# Patient Record
Sex: Female | Born: 1940 | Race: White | Hispanic: No | Smoking: Never smoker
Health system: Southern US, Community
[De-identification: ages and names within clinical notes are randomized; demographics above are authoritative.]

## PROBLEM LIST (undated history)

## (undated) DIAGNOSIS — I5032 Chronic diastolic (congestive) heart failure: Secondary | ICD-10-CM

## (undated) DIAGNOSIS — E785 Hyperlipidemia, unspecified: Secondary | ICD-10-CM

## (undated) DIAGNOSIS — E669 Obesity, unspecified: Secondary | ICD-10-CM

## (undated) DIAGNOSIS — R5381 Other malaise: Secondary | ICD-10-CM

## (undated) DIAGNOSIS — E559 Vitamin D deficiency, unspecified: Secondary | ICD-10-CM

## (undated) DIAGNOSIS — I10 Essential (primary) hypertension: Secondary | ICD-10-CM

## (undated) DIAGNOSIS — R635 Abnormal weight gain: Secondary | ICD-10-CM

## (undated) DIAGNOSIS — R079 Chest pain, unspecified: Secondary | ICD-10-CM

## (undated) DIAGNOSIS — R739 Hyperglycemia, unspecified: Secondary | ICD-10-CM

## (undated) DIAGNOSIS — G4734 Idiopathic sleep related nonobstructive alveolar hypoventilation: Secondary | ICD-10-CM

## (undated) DIAGNOSIS — R0609 Other forms of dyspnea: Secondary | ICD-10-CM

## (undated) DIAGNOSIS — Z6837 Body mass index (BMI) 37.0-37.9, adult: Secondary | ICD-10-CM

## (undated) DIAGNOSIS — H6500 Acute serous otitis media, unspecified ear: Secondary | ICD-10-CM

## (undated) DIAGNOSIS — F419 Anxiety disorder, unspecified: Secondary | ICD-10-CM

## (undated) DIAGNOSIS — G4700 Insomnia, unspecified: Secondary | ICD-10-CM

## (undated) DIAGNOSIS — R0600 Dyspnea, unspecified: Secondary | ICD-10-CM

## (undated) DIAGNOSIS — R140 Abdominal distension (gaseous): Secondary | ICD-10-CM

## (undated) DIAGNOSIS — G500 Trigeminal neuralgia: Secondary | ICD-10-CM

## (undated) DIAGNOSIS — R531 Weakness: Secondary | ICD-10-CM

## (undated) DIAGNOSIS — Z6841 Body Mass Index (BMI) 40.0 and over, adult: Secondary | ICD-10-CM

## (undated) DIAGNOSIS — Z9181 History of falling: Secondary | ICD-10-CM

## (undated) DIAGNOSIS — R0902 Hypoxemia: Secondary | ICD-10-CM

## (undated) DIAGNOSIS — J209 Acute bronchitis, unspecified: Secondary | ICD-10-CM

## (undated) DIAGNOSIS — E039 Hypothyroidism, unspecified: Secondary | ICD-10-CM

## (undated) DIAGNOSIS — S8012XA Contusion of left lower leg, initial encounter: Secondary | ICD-10-CM

## (undated) DIAGNOSIS — R931 Abnormal findings on diagnostic imaging of heart and coronary circulation: Secondary | ICD-10-CM

## (undated) DIAGNOSIS — F329 Major depressive disorder, single episode, unspecified: Secondary | ICD-10-CM

## (undated) DIAGNOSIS — R600 Localized edema: Secondary | ICD-10-CM

## (undated) DIAGNOSIS — R5383 Other fatigue: Secondary | ICD-10-CM

## (undated) HISTORY — DX: Hypoxemia: R09.02

## (undated) HISTORY — DX: Insomnia, unspecified: G47.00

## (undated) HISTORY — DX: Other fatigue: R53.83

## (undated) HISTORY — DX: Acute bronchitis, unspecified: J20.9

## (undated) HISTORY — DX: Vitamin D deficiency, unspecified: E55.9

## (undated) HISTORY — DX: Hyperlipidemia, unspecified: E78.5

## (undated) HISTORY — DX: Abnormal weight gain: R63.5

## (undated) HISTORY — DX: Anxiety disorder, unspecified: F41.9

## (undated) HISTORY — DX: Weakness: R53.1

## (undated) HISTORY — PX: CARDIAC CATHETERIZATION: SHX172

## (undated) HISTORY — DX: Other malaise: R53.81

## (undated) HISTORY — PX: TUBAL LIGATION: SHX77

## (undated) HISTORY — DX: Hypothyroidism, unspecified: E03.9

## (undated) HISTORY — DX: Major depressive disorder, single episode, unspecified: F32.9

## (undated) HISTORY — DX: Acute serous otitis media, unspecified ear: H65.00

## (undated) HISTORY — DX: Hyperglycemia, unspecified: R73.9

## (undated) HISTORY — PX: DILATION AND CURETTAGE OF UTERUS: SHX78

## (undated) HISTORY — DX: Essential (primary) hypertension: I10

## (undated) HISTORY — DX: History of falling: Z91.81

## (undated) HISTORY — DX: Trigeminal neuralgia: G50.0

## (undated) HISTORY — DX: Chest pain, unspecified: R07.9

## (undated) HISTORY — DX: Idiopathic sleep related nonobstructive alveolar hypoventilation: G47.34

## (undated) HISTORY — DX: Body mass index (BMI) 37.0-37.9, adult: Z68.37

## (undated) HISTORY — DX: Abdominal distension (gaseous): R14.0

## (undated) HISTORY — DX: Abnormal findings on diagnostic imaging of heart and coronary circulation: R93.1

## (undated) HISTORY — DX: Body Mass Index (BMI) 40.0 and over, adult: Z684

## (undated) HISTORY — DX: Contusion of left lower leg, initial encounter: S80.12XA

## (undated) HISTORY — DX: Other forms of dyspnea: R06.09

## (undated) HISTORY — DX: Chronic diastolic (congestive) heart failure: I50.32

## (undated) HISTORY — DX: Localized edema: R60.0

## (undated) HISTORY — DX: Obesity, unspecified: E66.9

## (undated) HISTORY — DX: Dyspnea, unspecified: R06.00

---

## 1998-08-07 ENCOUNTER — Other Ambulatory Visit: Admission: RE | Admit: 1998-08-07 | Discharge: 1998-08-07 | Payer: Self-pay | Admitting: Obstetrics & Gynecology

## 1999-08-29 ENCOUNTER — Other Ambulatory Visit: Admission: RE | Admit: 1999-08-29 | Discharge: 1999-08-29 | Payer: Self-pay | Admitting: Obstetrics & Gynecology

## 2000-09-29 ENCOUNTER — Other Ambulatory Visit: Admission: RE | Admit: 2000-09-29 | Discharge: 2000-09-29 | Payer: Self-pay | Admitting: Obstetrics & Gynecology

## 2002-01-18 ENCOUNTER — Other Ambulatory Visit: Admission: RE | Admit: 2002-01-18 | Discharge: 2002-01-18 | Payer: Self-pay | Admitting: Obstetrics & Gynecology

## 2003-03-31 ENCOUNTER — Other Ambulatory Visit: Admission: RE | Admit: 2003-03-31 | Discharge: 2003-03-31 | Payer: Self-pay | Admitting: Obstetrics & Gynecology

## 2004-06-04 ENCOUNTER — Other Ambulatory Visit: Admission: RE | Admit: 2004-06-04 | Discharge: 2004-06-04 | Payer: Self-pay | Admitting: Obstetrics & Gynecology

## 2005-09-08 ENCOUNTER — Other Ambulatory Visit: Admission: RE | Admit: 2005-09-08 | Discharge: 2005-09-08 | Payer: Self-pay | Admitting: Obstetrics & Gynecology

## 2011-10-17 DIAGNOSIS — R141 Gas pain: Secondary | ICD-10-CM | POA: Diagnosis not present

## 2011-10-17 DIAGNOSIS — R143 Flatulence: Secondary | ICD-10-CM | POA: Diagnosis not present

## 2011-10-17 DIAGNOSIS — I10 Essential (primary) hypertension: Secondary | ICD-10-CM | POA: Diagnosis not present

## 2011-10-17 DIAGNOSIS — Z6836 Body mass index (BMI) 36.0-36.9, adult: Secondary | ICD-10-CM | POA: Diagnosis not present

## 2011-12-12 DIAGNOSIS — E669 Obesity, unspecified: Secondary | ICD-10-CM | POA: Diagnosis not present

## 2011-12-12 DIAGNOSIS — E785 Hyperlipidemia, unspecified: Secondary | ICD-10-CM | POA: Diagnosis not present

## 2011-12-12 DIAGNOSIS — I10 Essential (primary) hypertension: Secondary | ICD-10-CM | POA: Diagnosis not present

## 2011-12-12 DIAGNOSIS — Z6836 Body mass index (BMI) 36.0-36.9, adult: Secondary | ICD-10-CM | POA: Diagnosis not present

## 2012-02-17 DIAGNOSIS — Z6836 Body mass index (BMI) 36.0-36.9, adult: Secondary | ICD-10-CM | POA: Diagnosis not present

## 2012-02-17 DIAGNOSIS — R609 Edema, unspecified: Secondary | ICD-10-CM | POA: Diagnosis not present

## 2012-03-24 DIAGNOSIS — Z6836 Body mass index (BMI) 36.0-36.9, adult: Secondary | ICD-10-CM | POA: Diagnosis not present

## 2012-03-24 DIAGNOSIS — I10 Essential (primary) hypertension: Secondary | ICD-10-CM | POA: Diagnosis not present

## 2012-03-24 DIAGNOSIS — R609 Edema, unspecified: Secondary | ICD-10-CM | POA: Diagnosis not present

## 2012-03-24 DIAGNOSIS — E785 Hyperlipidemia, unspecified: Secondary | ICD-10-CM | POA: Diagnosis not present

## 2012-06-25 DIAGNOSIS — R609 Edema, unspecified: Secondary | ICD-10-CM | POA: Diagnosis not present

## 2012-06-25 DIAGNOSIS — Z6836 Body mass index (BMI) 36.0-36.9, adult: Secondary | ICD-10-CM | POA: Diagnosis not present

## 2012-06-25 DIAGNOSIS — I10 Essential (primary) hypertension: Secondary | ICD-10-CM | POA: Diagnosis not present

## 2012-06-25 DIAGNOSIS — E785 Hyperlipidemia, unspecified: Secondary | ICD-10-CM | POA: Diagnosis not present

## 2012-08-06 DIAGNOSIS — Z1231 Encounter for screening mammogram for malignant neoplasm of breast: Secondary | ICD-10-CM | POA: Diagnosis not present

## 2012-11-23 DIAGNOSIS — I10 Essential (primary) hypertension: Secondary | ICD-10-CM | POA: Diagnosis not present

## 2012-11-23 DIAGNOSIS — R609 Edema, unspecified: Secondary | ICD-10-CM | POA: Diagnosis not present

## 2012-11-23 DIAGNOSIS — E785 Hyperlipidemia, unspecified: Secondary | ICD-10-CM | POA: Diagnosis not present

## 2012-11-23 DIAGNOSIS — Z6837 Body mass index (BMI) 37.0-37.9, adult: Secondary | ICD-10-CM | POA: Diagnosis not present

## 2013-03-29 DIAGNOSIS — Z6836 Body mass index (BMI) 36.0-36.9, adult: Secondary | ICD-10-CM | POA: Diagnosis not present

## 2013-03-29 DIAGNOSIS — I10 Essential (primary) hypertension: Secondary | ICD-10-CM | POA: Diagnosis not present

## 2013-03-29 DIAGNOSIS — Z1331 Encounter for screening for depression: Secondary | ICD-10-CM | POA: Diagnosis not present

## 2013-03-29 DIAGNOSIS — E785 Hyperlipidemia, unspecified: Secondary | ICD-10-CM | POA: Diagnosis not present

## 2013-03-29 DIAGNOSIS — R609 Edema, unspecified: Secondary | ICD-10-CM | POA: Diagnosis not present

## 2013-03-29 DIAGNOSIS — Z9181 History of falling: Secondary | ICD-10-CM | POA: Diagnosis not present

## 2013-11-30 DIAGNOSIS — R609 Edema, unspecified: Secondary | ICD-10-CM | POA: Diagnosis not present

## 2013-11-30 DIAGNOSIS — E785 Hyperlipidemia, unspecified: Secondary | ICD-10-CM | POA: Diagnosis not present

## 2013-11-30 DIAGNOSIS — Z6837 Body mass index (BMI) 37.0-37.9, adult: Secondary | ICD-10-CM | POA: Diagnosis not present

## 2013-11-30 DIAGNOSIS — I10 Essential (primary) hypertension: Secondary | ICD-10-CM | POA: Diagnosis not present

## 2014-02-02 DIAGNOSIS — Z6837 Body mass index (BMI) 37.0-37.9, adult: Secondary | ICD-10-CM | POA: Diagnosis not present

## 2014-02-02 DIAGNOSIS — J069 Acute upper respiratory infection, unspecified: Secondary | ICD-10-CM | POA: Diagnosis not present

## 2014-02-02 DIAGNOSIS — J309 Allergic rhinitis, unspecified: Secondary | ICD-10-CM | POA: Diagnosis not present

## 2014-02-27 DIAGNOSIS — J209 Acute bronchitis, unspecified: Secondary | ICD-10-CM | POA: Diagnosis not present

## 2014-02-27 DIAGNOSIS — Z6837 Body mass index (BMI) 37.0-37.9, adult: Secondary | ICD-10-CM | POA: Diagnosis not present

## 2014-03-10 DIAGNOSIS — I10 Essential (primary) hypertension: Secondary | ICD-10-CM | POA: Diagnosis not present

## 2014-03-10 DIAGNOSIS — E785 Hyperlipidemia, unspecified: Secondary | ICD-10-CM | POA: Diagnosis not present

## 2014-03-10 DIAGNOSIS — Z6841 Body Mass Index (BMI) 40.0 and over, adult: Secondary | ICD-10-CM | POA: Diagnosis not present

## 2014-03-10 DIAGNOSIS — R609 Edema, unspecified: Secondary | ICD-10-CM | POA: Diagnosis not present

## 2014-08-01 DIAGNOSIS — I10 Essential (primary) hypertension: Secondary | ICD-10-CM | POA: Diagnosis not present

## 2014-08-01 DIAGNOSIS — R609 Edema, unspecified: Secondary | ICD-10-CM | POA: Diagnosis not present

## 2014-08-01 DIAGNOSIS — F419 Anxiety disorder, unspecified: Secondary | ICD-10-CM | POA: Diagnosis not present

## 2014-08-01 DIAGNOSIS — G4700 Insomnia, unspecified: Secondary | ICD-10-CM | POA: Diagnosis not present

## 2014-08-01 DIAGNOSIS — Z9181 History of falling: Secondary | ICD-10-CM | POA: Diagnosis not present

## 2014-08-01 DIAGNOSIS — E785 Hyperlipidemia, unspecified: Secondary | ICD-10-CM | POA: Diagnosis not present

## 2014-08-01 DIAGNOSIS — Z1389 Encounter for screening for other disorder: Secondary | ICD-10-CM | POA: Diagnosis not present

## 2014-08-01 DIAGNOSIS — Z6837 Body mass index (BMI) 37.0-37.9, adult: Secondary | ICD-10-CM | POA: Diagnosis not present

## 2015-02-15 DIAGNOSIS — R609 Edema, unspecified: Secondary | ICD-10-CM | POA: Diagnosis not present

## 2015-02-15 DIAGNOSIS — E785 Hyperlipidemia, unspecified: Secondary | ICD-10-CM | POA: Diagnosis not present

## 2015-02-15 DIAGNOSIS — Z6838 Body mass index (BMI) 38.0-38.9, adult: Secondary | ICD-10-CM | POA: Diagnosis not present

## 2015-02-15 DIAGNOSIS — I10 Essential (primary) hypertension: Secondary | ICD-10-CM | POA: Diagnosis not present

## 2015-02-15 DIAGNOSIS — R635 Abnormal weight gain: Secondary | ICD-10-CM | POA: Diagnosis not present

## 2015-02-15 DIAGNOSIS — Z9181 History of falling: Secondary | ICD-10-CM | POA: Diagnosis not present

## 2015-02-15 DIAGNOSIS — F329 Major depressive disorder, single episode, unspecified: Secondary | ICD-10-CM | POA: Diagnosis not present

## 2015-02-15 DIAGNOSIS — G500 Trigeminal neuralgia: Secondary | ICD-10-CM | POA: Diagnosis not present

## 2015-03-08 DIAGNOSIS — Z6838 Body mass index (BMI) 38.0-38.9, adult: Secondary | ICD-10-CM | POA: Diagnosis not present

## 2015-03-08 DIAGNOSIS — R21 Rash and other nonspecific skin eruption: Secondary | ICD-10-CM | POA: Diagnosis not present

## 2015-06-14 DIAGNOSIS — E785 Hyperlipidemia, unspecified: Secondary | ICD-10-CM | POA: Diagnosis not present

## 2015-06-14 DIAGNOSIS — I10 Essential (primary) hypertension: Secondary | ICD-10-CM | POA: Diagnosis not present

## 2015-06-14 DIAGNOSIS — Z6838 Body mass index (BMI) 38.0-38.9, adult: Secondary | ICD-10-CM | POA: Diagnosis not present

## 2015-06-14 DIAGNOSIS — F419 Anxiety disorder, unspecified: Secondary | ICD-10-CM | POA: Diagnosis not present

## 2015-06-14 DIAGNOSIS — R635 Abnormal weight gain: Secondary | ICD-10-CM | POA: Diagnosis not present

## 2015-06-14 DIAGNOSIS — R600 Localized edema: Secondary | ICD-10-CM | POA: Diagnosis not present

## 2015-07-17 DIAGNOSIS — I10 Essential (primary) hypertension: Secondary | ICD-10-CM | POA: Diagnosis not present

## 2015-07-17 DIAGNOSIS — Z6837 Body mass index (BMI) 37.0-37.9, adult: Secondary | ICD-10-CM | POA: Diagnosis not present

## 2015-08-21 DIAGNOSIS — I10 Essential (primary) hypertension: Secondary | ICD-10-CM | POA: Diagnosis not present

## 2015-08-21 DIAGNOSIS — E669 Obesity, unspecified: Secondary | ICD-10-CM | POA: Diagnosis not present

## 2015-08-21 DIAGNOSIS — R079 Chest pain, unspecified: Secondary | ICD-10-CM | POA: Diagnosis not present

## 2015-11-08 DIAGNOSIS — R079 Chest pain, unspecified: Secondary | ICD-10-CM | POA: Diagnosis not present

## 2015-11-08 DIAGNOSIS — Z1389 Encounter for screening for other disorder: Secondary | ICD-10-CM | POA: Diagnosis not present

## 2015-11-08 DIAGNOSIS — R600 Localized edema: Secondary | ICD-10-CM | POA: Diagnosis not present

## 2015-11-08 DIAGNOSIS — Z6838 Body mass index (BMI) 38.0-38.9, adult: Secondary | ICD-10-CM | POA: Diagnosis not present

## 2015-11-08 DIAGNOSIS — I10 Essential (primary) hypertension: Secondary | ICD-10-CM | POA: Diagnosis not present

## 2015-11-08 DIAGNOSIS — Z139 Encounter for screening, unspecified: Secondary | ICD-10-CM | POA: Diagnosis not present

## 2015-11-08 DIAGNOSIS — E785 Hyperlipidemia, unspecified: Secondary | ICD-10-CM | POA: Diagnosis not present

## 2015-11-08 DIAGNOSIS — E669 Obesity, unspecified: Secondary | ICD-10-CM | POA: Diagnosis not present

## 2015-11-08 DIAGNOSIS — R635 Abnormal weight gain: Secondary | ICD-10-CM | POA: Diagnosis not present

## 2015-12-13 DIAGNOSIS — R6889 Other general symptoms and signs: Secondary | ICD-10-CM | POA: Diagnosis not present

## 2015-12-13 DIAGNOSIS — Z6838 Body mass index (BMI) 38.0-38.9, adult: Secondary | ICD-10-CM | POA: Diagnosis not present

## 2015-12-13 DIAGNOSIS — J101 Influenza due to other identified influenza virus with other respiratory manifestations: Secondary | ICD-10-CM | POA: Diagnosis not present

## 2016-02-14 DIAGNOSIS — E039 Hypothyroidism, unspecified: Secondary | ICD-10-CM | POA: Diagnosis not present

## 2016-02-14 DIAGNOSIS — Z6838 Body mass index (BMI) 38.0-38.9, adult: Secondary | ICD-10-CM | POA: Diagnosis not present

## 2016-02-14 DIAGNOSIS — E785 Hyperlipidemia, unspecified: Secondary | ICD-10-CM | POA: Diagnosis not present

## 2016-02-14 DIAGNOSIS — R600 Localized edema: Secondary | ICD-10-CM | POA: Diagnosis not present

## 2016-02-14 DIAGNOSIS — E669 Obesity, unspecified: Secondary | ICD-10-CM | POA: Diagnosis not present

## 2016-02-14 DIAGNOSIS — I10 Essential (primary) hypertension: Secondary | ICD-10-CM | POA: Diagnosis not present

## 2016-03-18 DIAGNOSIS — Z6838 Body mass index (BMI) 38.0-38.9, adult: Secondary | ICD-10-CM | POA: Diagnosis not present

## 2016-03-18 DIAGNOSIS — M8589 Other specified disorders of bone density and structure, multiple sites: Secondary | ICD-10-CM | POA: Diagnosis not present

## 2016-03-18 DIAGNOSIS — Z1231 Encounter for screening mammogram for malignant neoplasm of breast: Secondary | ICD-10-CM | POA: Diagnosis not present

## 2016-03-18 DIAGNOSIS — Z9181 History of falling: Secondary | ICD-10-CM | POA: Diagnosis not present

## 2016-03-26 DIAGNOSIS — M8589 Other specified disorders of bone density and structure, multiple sites: Secondary | ICD-10-CM | POA: Diagnosis not present

## 2016-03-26 DIAGNOSIS — Z1231 Encounter for screening mammogram for malignant neoplasm of breast: Secondary | ICD-10-CM | POA: Diagnosis not present

## 2016-03-28 DIAGNOSIS — Z1231 Encounter for screening mammogram for malignant neoplasm of breast: Secondary | ICD-10-CM | POA: Diagnosis not present

## 2016-03-28 DIAGNOSIS — M8589 Other specified disorders of bone density and structure, multiple sites: Secondary | ICD-10-CM | POA: Diagnosis not present

## 2016-07-03 DIAGNOSIS — E785 Hyperlipidemia, unspecified: Secondary | ICD-10-CM | POA: Diagnosis not present

## 2016-07-03 DIAGNOSIS — R5381 Other malaise: Secondary | ICD-10-CM | POA: Diagnosis not present

## 2016-07-03 DIAGNOSIS — I10 Essential (primary) hypertension: Secondary | ICD-10-CM | POA: Diagnosis not present

## 2016-07-03 DIAGNOSIS — E039 Hypothyroidism, unspecified: Secondary | ICD-10-CM | POA: Diagnosis not present

## 2016-07-03 DIAGNOSIS — R739 Hyperglycemia, unspecified: Secondary | ICD-10-CM | POA: Diagnosis not present

## 2016-07-03 DIAGNOSIS — Z0000 Encounter for general adult medical examination without abnormal findings: Secondary | ICD-10-CM | POA: Diagnosis not present

## 2016-07-03 DIAGNOSIS — Z1211 Encounter for screening for malignant neoplasm of colon: Secondary | ICD-10-CM | POA: Diagnosis not present

## 2016-10-10 DIAGNOSIS — I10 Essential (primary) hypertension: Secondary | ICD-10-CM | POA: Diagnosis not present

## 2016-10-10 DIAGNOSIS — E785 Hyperlipidemia, unspecified: Secondary | ICD-10-CM | POA: Diagnosis not present

## 2016-10-10 DIAGNOSIS — E039 Hypothyroidism, unspecified: Secondary | ICD-10-CM | POA: Diagnosis not present

## 2016-10-10 DIAGNOSIS — Z6841 Body Mass Index (BMI) 40.0 and over, adult: Secondary | ICD-10-CM | POA: Diagnosis not present

## 2016-10-10 DIAGNOSIS — E669 Obesity, unspecified: Secondary | ICD-10-CM | POA: Diagnosis not present

## 2016-10-10 DIAGNOSIS — R739 Hyperglycemia, unspecified: Secondary | ICD-10-CM | POA: Diagnosis not present

## 2016-10-10 DIAGNOSIS — Z2821 Immunization not carried out because of patient refusal: Secondary | ICD-10-CM | POA: Diagnosis not present

## 2017-01-23 DIAGNOSIS — E785 Hyperlipidemia, unspecified: Secondary | ICD-10-CM | POA: Diagnosis not present

## 2017-01-23 DIAGNOSIS — Z1231 Encounter for screening mammogram for malignant neoplasm of breast: Secondary | ICD-10-CM | POA: Diagnosis not present

## 2017-01-23 DIAGNOSIS — Z1389 Encounter for screening for other disorder: Secondary | ICD-10-CM | POA: Diagnosis not present

## 2017-01-23 DIAGNOSIS — E669 Obesity, unspecified: Secondary | ICD-10-CM | POA: Diagnosis not present

## 2017-01-23 DIAGNOSIS — I10 Essential (primary) hypertension: Secondary | ICD-10-CM | POA: Diagnosis not present

## 2017-01-23 DIAGNOSIS — R739 Hyperglycemia, unspecified: Secondary | ICD-10-CM | POA: Diagnosis not present

## 2017-03-30 DIAGNOSIS — Z1231 Encounter for screening mammogram for malignant neoplasm of breast: Secondary | ICD-10-CM | POA: Diagnosis not present

## 2017-05-28 DIAGNOSIS — Z9181 History of falling: Secondary | ICD-10-CM | POA: Diagnosis not present

## 2017-05-28 DIAGNOSIS — R739 Hyperglycemia, unspecified: Secondary | ICD-10-CM | POA: Diagnosis not present

## 2017-05-28 DIAGNOSIS — I10 Essential (primary) hypertension: Secondary | ICD-10-CM | POA: Diagnosis not present

## 2017-05-28 DIAGNOSIS — E039 Hypothyroidism, unspecified: Secondary | ICD-10-CM | POA: Diagnosis not present

## 2017-05-28 DIAGNOSIS — Z6841 Body Mass Index (BMI) 40.0 and over, adult: Secondary | ICD-10-CM | POA: Diagnosis not present

## 2017-05-28 DIAGNOSIS — Z139 Encounter for screening, unspecified: Secondary | ICD-10-CM | POA: Diagnosis not present

## 2017-05-28 DIAGNOSIS — E669 Obesity, unspecified: Secondary | ICD-10-CM | POA: Diagnosis not present

## 2017-05-28 DIAGNOSIS — E785 Hyperlipidemia, unspecified: Secondary | ICD-10-CM | POA: Diagnosis not present

## 2017-09-02 DIAGNOSIS — R0609 Other forms of dyspnea: Secondary | ICD-10-CM | POA: Diagnosis not present

## 2017-09-02 DIAGNOSIS — Z2821 Immunization not carried out because of patient refusal: Secondary | ICD-10-CM | POA: Diagnosis not present

## 2017-09-02 DIAGNOSIS — I10 Essential (primary) hypertension: Secondary | ICD-10-CM | POA: Diagnosis not present

## 2017-09-02 DIAGNOSIS — R739 Hyperglycemia, unspecified: Secondary | ICD-10-CM | POA: Diagnosis not present

## 2017-09-02 DIAGNOSIS — Z139 Encounter for screening, unspecified: Secondary | ICD-10-CM | POA: Diagnosis not present

## 2017-09-02 DIAGNOSIS — E039 Hypothyroidism, unspecified: Secondary | ICD-10-CM | POA: Diagnosis not present

## 2017-09-02 DIAGNOSIS — E785 Hyperlipidemia, unspecified: Secondary | ICD-10-CM | POA: Diagnosis not present

## 2017-12-15 DIAGNOSIS — R140 Abdominal distension (gaseous): Secondary | ICD-10-CM | POA: Diagnosis not present

## 2017-12-15 DIAGNOSIS — E785 Hyperlipidemia, unspecified: Secondary | ICD-10-CM | POA: Diagnosis not present

## 2017-12-15 DIAGNOSIS — I10 Essential (primary) hypertension: Secondary | ICD-10-CM | POA: Diagnosis not present

## 2017-12-15 DIAGNOSIS — Z6841 Body Mass Index (BMI) 40.0 and over, adult: Secondary | ICD-10-CM | POA: Diagnosis not present

## 2017-12-15 DIAGNOSIS — E039 Hypothyroidism, unspecified: Secondary | ICD-10-CM | POA: Diagnosis not present

## 2017-12-15 DIAGNOSIS — R0609 Other forms of dyspnea: Secondary | ICD-10-CM | POA: Diagnosis not present

## 2017-12-15 DIAGNOSIS — R739 Hyperglycemia, unspecified: Secondary | ICD-10-CM | POA: Diagnosis not present

## 2017-12-15 DIAGNOSIS — Z2821 Immunization not carried out because of patient refusal: Secondary | ICD-10-CM | POA: Diagnosis not present

## 2017-12-29 DIAGNOSIS — Z2821 Immunization not carried out because of patient refusal: Secondary | ICD-10-CM | POA: Diagnosis not present

## 2017-12-29 DIAGNOSIS — Z6841 Body Mass Index (BMI) 40.0 and over, adult: Secondary | ICD-10-CM | POA: Diagnosis not present

## 2017-12-29 DIAGNOSIS — I10 Essential (primary) hypertension: Secondary | ICD-10-CM | POA: Diagnosis not present

## 2018-01-01 DIAGNOSIS — R0600 Dyspnea, unspecified: Secondary | ICD-10-CM | POA: Diagnosis not present

## 2018-01-01 DIAGNOSIS — R109 Unspecified abdominal pain: Secondary | ICD-10-CM | POA: Diagnosis not present

## 2018-01-01 DIAGNOSIS — R140 Abdominal distension (gaseous): Secondary | ICD-10-CM | POA: Diagnosis not present

## 2018-01-01 DIAGNOSIS — R0609 Other forms of dyspnea: Secondary | ICD-10-CM | POA: Diagnosis not present

## 2018-01-01 DIAGNOSIS — R0602 Shortness of breath: Secondary | ICD-10-CM | POA: Diagnosis not present

## 2018-01-08 DIAGNOSIS — Z1231 Encounter for screening mammogram for malignant neoplasm of breast: Secondary | ICD-10-CM | POA: Diagnosis not present

## 2018-01-08 DIAGNOSIS — E785 Hyperlipidemia, unspecified: Secondary | ICD-10-CM | POA: Diagnosis not present

## 2018-01-08 DIAGNOSIS — Z139 Encounter for screening, unspecified: Secondary | ICD-10-CM | POA: Diagnosis not present

## 2018-01-08 DIAGNOSIS — Z0000 Encounter for general adult medical examination without abnormal findings: Secondary | ICD-10-CM | POA: Diagnosis not present

## 2018-01-08 DIAGNOSIS — Z136 Encounter for screening for cardiovascular disorders: Secondary | ICD-10-CM | POA: Diagnosis not present

## 2018-01-08 DIAGNOSIS — E669 Obesity, unspecified: Secondary | ICD-10-CM | POA: Diagnosis not present

## 2018-01-08 DIAGNOSIS — N959 Unspecified menopausal and perimenopausal disorder: Secondary | ICD-10-CM | POA: Diagnosis not present

## 2018-01-08 DIAGNOSIS — Z1331 Encounter for screening for depression: Secondary | ICD-10-CM | POA: Diagnosis not present

## 2018-01-08 DIAGNOSIS — Z9181 History of falling: Secondary | ICD-10-CM | POA: Diagnosis not present

## 2018-01-28 DIAGNOSIS — I10 Essential (primary) hypertension: Secondary | ICD-10-CM | POA: Diagnosis not present

## 2018-01-28 DIAGNOSIS — Z23 Encounter for immunization: Secondary | ICD-10-CM | POA: Diagnosis not present

## 2018-02-03 DIAGNOSIS — R5383 Other fatigue: Secondary | ICD-10-CM

## 2018-02-03 DIAGNOSIS — I10 Essential (primary) hypertension: Secondary | ICD-10-CM

## 2018-02-03 DIAGNOSIS — F32A Depression, unspecified: Secondary | ICD-10-CM

## 2018-02-03 DIAGNOSIS — R600 Localized edema: Secondary | ICD-10-CM | POA: Insufficient documentation

## 2018-02-03 DIAGNOSIS — R739 Hyperglycemia, unspecified: Secondary | ICD-10-CM

## 2018-02-03 DIAGNOSIS — R931 Abnormal findings on diagnostic imaging of heart and coronary circulation: Secondary | ICD-10-CM

## 2018-02-03 DIAGNOSIS — F329 Major depressive disorder, single episode, unspecified: Secondary | ICD-10-CM | POA: Insufficient documentation

## 2018-02-03 DIAGNOSIS — E039 Hypothyroidism, unspecified: Secondary | ICD-10-CM

## 2018-02-03 DIAGNOSIS — G500 Trigeminal neuralgia: Secondary | ICD-10-CM | POA: Insufficient documentation

## 2018-02-03 DIAGNOSIS — E785 Hyperlipidemia, unspecified: Secondary | ICD-10-CM

## 2018-02-03 HISTORY — DX: Depression, unspecified: F32.A

## 2018-02-03 HISTORY — DX: Trigeminal neuralgia: G50.0

## 2018-02-03 HISTORY — DX: Essential (primary) hypertension: I10

## 2018-02-03 HISTORY — DX: Localized edema: R60.0

## 2018-02-03 HISTORY — DX: Hyperlipidemia, unspecified: E78.5

## 2018-02-03 HISTORY — DX: Hyperglycemia, unspecified: R73.9

## 2018-02-03 HISTORY — DX: Abnormal findings on diagnostic imaging of heart and coronary circulation: R93.1

## 2018-02-03 HISTORY — DX: Other fatigue: R53.83

## 2018-02-03 HISTORY — DX: Hypothyroidism, unspecified: E03.9

## 2018-02-11 ENCOUNTER — Ambulatory Visit: Payer: Medicare HMO | Admitting: Cardiology

## 2018-02-11 ENCOUNTER — Encounter: Payer: Self-pay | Admitting: Cardiology

## 2018-02-11 VITALS — BP 154/90 | HR 59 | Ht 62.0 in | Wt 217.0 lb

## 2018-02-11 DIAGNOSIS — I5032 Chronic diastolic (congestive) heart failure: Secondary | ICD-10-CM | POA: Diagnosis not present

## 2018-02-11 DIAGNOSIS — I110 Hypertensive heart disease with heart failure: Secondary | ICD-10-CM

## 2018-02-11 DIAGNOSIS — R931 Abnormal findings on diagnostic imaging of heart and coronary circulation: Secondary | ICD-10-CM | POA: Diagnosis not present

## 2018-02-11 DIAGNOSIS — I3481 Nonrheumatic mitral (valve) annulus calcification: Secondary | ICD-10-CM | POA: Insufficient documentation

## 2018-02-11 DIAGNOSIS — I447 Left bundle-branch block, unspecified: Secondary | ICD-10-CM | POA: Diagnosis not present

## 2018-02-11 DIAGNOSIS — I059 Rheumatic mitral valve disease, unspecified: Secondary | ICD-10-CM | POA: Diagnosis not present

## 2018-02-11 HISTORY — DX: Rheumatic mitral valve disease, unspecified: I05.9

## 2018-02-11 HISTORY — DX: Hypertensive heart disease with heart failure: I11.0

## 2018-02-11 HISTORY — DX: Nonrheumatic mitral (valve) annulus calcification: I34.81

## 2018-02-11 MED ORDER — FUROSEMIDE 20 MG PO TABS: 20.0000 mg | ORAL_TABLET | Freq: Every day | ORAL | 11 refills | Status: DC

## 2018-02-11 NOTE — Progress Notes (Signed)
Cardiology Office Note:    Date:  02/11/2018   ID:  Nicole Baldwin, DOB [birth year], MRN [id number]  PCP:  Nicole Baldwin  Cardiologist:  Nicole More, MD   Referring MD: Nicole Baldwin  ASSESSMENT:    1. Chronic diastolic heart failure ([place])   2. Mitral annular calcification   3. Hypertensive heart disease with chronic diastolic congestive heart failure ([place])   4. Abnormal echocardiogram   5. LBBB (left bundle branch block)    PLAN:    In order of problems listed above:   1.       Clinically with hypertension edema orthopnea PND she has chronic diastolic heart failure she will fully sodium restrict discontinue her thiazide diuretic sort of Baldwin low dose of loop diuretic weigh daily and follow-up BMP BMP in her PCP office in 1 week.  I will see her back in my office I suspect she will have Baldwin marked clinical improvement. 1. She has senile calcific disease I do not think she has any clinically significant valve dysfunction and I would not diagnose her as mitral stenosis.  She is reassured. 2. Blood pressure is not at target she should respond to diuresis continue ACE inhibitor  3. Managed by her PCP 4. Reviewed her echocardiogram she does not have mitral stenosis has no significant mitral clinically I do not think she has any significant valvular heart disease and she is very reassured 5. Stable, she has no evidence of cardiomyopathy or asynchronous contractility despite left bundle branch block.  Next appointment 1 month   Medication Adjustments/Labs and Tests Ordered: Current medicines are reviewed at length with the patient today.  Concerns regarding medicines are outlined above.  Orders Placed This Encounter  Procedures  . Basic metabolic panel  . Pro b natriuretic peptide (BNP)  . EKG 12-Lead   Meds ordered this encounter  Medications  . furosemide (LASIX) 20 MG tablet    Sig: Take 1 tablet (20 mg total) by mouth daily.    Dispense:  30 tablet    Refill:  11      Chief Complaint  Patient presents with  . New Patient (Initial Visit)    per Nicole Peace, Baldwin   . Cardiac Valve Problem    found on recent echo at [facility]  . Hypertension    History of Present Illness:    Nicole Baldwin is Baldwin 77 y.o. female who is being seen today for the evaluation of an abnormal cardiac echo at the request of Moon, Amy Baldwin, Baldwin.  The echocardiogram performed at outside hospital 01/01/2018 shows mitral annular calcification and Baldwin  mitral  valve area 1.65 and 2.18 cm2 which is then described as moderate mitral stenosis at most this is very mild or normal calculation.  The patient does not have valvular rheumatic heart disease, her  ejection fraction was normal and there is mild left ventricular hypertrophy mild left atrium dilatation mild tricuspid regurgitation and no evidence of pulmonary hypertension.  Surprisingly only trace mitral regurgitation is present with the mitral valve structural changes.  The test was done to evaluate dyspnea unfortunately diastolic filling pressure was not defined as normal or elevated.  I do not have records but she tells me admit her Baldwin than 20 years ago when she had normal coronary arteriography.  For the last 4 to 5 years she has been aware of shortness of breath with activity but is worsened recently occurs climbing stairs walking longer distance she is  also developed increased ankle edema as well as orthopnea and episodes of nocturnal dyspnea.  This prompted the echocardiogram.  Chest pain palpitation or syncope.  Unfortunately she had salt to her diet Past Medical History:  Diagnosis Date  . Abnormal echocardiogram 02/03/2018  . Bilateral edema of lower extremity 02/03/2018  . Depression 02/03/2018  . Dyslipidemia 02/03/2018  . Essential hypertension 02/03/2018  . Fatigue 02/03/2018  . Hyperglycemia 02/03/2018  . Hyperlipidemia   . Hypothyroidism 02/03/2018  . Trigeminal neuralgia 02/03/2018    Past Surgical History:  Procedure Laterality Date   . CARDIAC CATHETERIZATION    . DILATION AND CURETTAGE OF UTERUS    . TUBAL LIGATION      Current Medications: Current Meds  Medication Sig  . aspirin EC 81 MG tablet Take 81 mg by mouth daily.  . Calcium Carb-Cholecalciferol (CALCIUM 600+D3) 600-200 MG-UNIT TABS Take 1 tablet by mouth 2 (two) times daily.   Marland Kitchen levothyroxine (SYNTHROID, LEVOTHROID) 50 MCG tablet Take 50 mcg by mouth daily before breakfast.  . lisinopril (PRINIVIL,ZESTRIL) 40 MG tablet Take 40 mg by mouth daily.  . metoprolol tartrate (LOPRESSOR) 50 MG tablet Take 50 mg by mouth 2 (two) times daily.  . Multiple Vitamin (MULTI VITAMIN DAILY PO) Take 1 tablet by mouth daily.  . Omega-3 Fatty Acids (FISH OIL) 1200 MG CAPS Take 1 capsule by mouth daily.  . simvastatin (ZOCOR) 20 MG tablet Take 20 mg by mouth daily.  . [DISCONTINUED] hydrochlorothiazide (HYDRODIURIL) 25 MG tablet Take 25 mg by mouth daily.     Allergies:   Patient has no known allergies.   Social History   Socioeconomic History  . Marital status: Unknown    Spouse name: Not on file  . Number of children: Not on file  . Years of education: Not on file  . Highest education level: Not on file  Occupational History  . Not on file  Social Needs  . Financial resource strain: Not on file  . Food insecurity:    Worry: Not on file    Inability: Not on file  . Transportation needs:    Medical: Not on file    Non-medical: Not on file  Tobacco Use  . Smoking status: Never Smoker  . Smokeless tobacco: Never Used  Substance and Sexual Activity  . Alcohol use: Not Currently  . Drug use: Not Currently  . Sexual activity: Not on file  Lifestyle  . Physical activity:    Days per week: Not on file    Minutes per session: Not on file  . Stress: Not on file  Relationships  . Social connections:    Talks on phone: Not on file    Gets together: Not on file    Attends religious service: Not on file    Active member of club or organization: Not on file     Attends meetings of clubs or organizations: Not on file    Relationship status: Not on file  Other Topics Concern  . Not on file  Social History Narrative  . Not on file     Family History: The patient's family history includes Breast cancer in her mother; Colon cancer in her son; Diabetes in her brother; Hypertension in her father; Stomach cancer in her son; Testicular cancer in her son.  ROS:   Review of Systems  Constitution: Negative.  HENT: Positive for stridor.   Eyes: Negative.   Cardiovascular: Positive for dyspnea on exertion, leg swelling and orthopnea.  Respiratory: Positive  for sleep disturbances due to breathing and snoring.   Endocrine: Negative.   Hematologic/Lymphatic: Negative.   Skin: Negative.   Musculoskeletal: Negative.   Gastrointestinal: Negative.   Genitourinary: Negative.   Neurological: Negative.   Psychiatric/Behavioral: Negative.   Allergic/Immunologic: Negative.    Please see the history of present illness.     All other systems reviewed and are negative.  EKGs/Labs/Other Studies Reviewed:    The following studies were reviewed today: PCP records reviewed as well as echocardiogram from [facility]  EKG:  EKG is  ordered today.  The ekg ordered today demonstrates left bundle branch block  Recent Labs: 12/15/17 Cr BUN normal No results found for requested labs within last 8760 hours.  Recent Lipid Panel  Chol 158, HDL 56 LDL 81   In order of problems listed above:  1.       Clinically with hypertension edema orthopnea PND she has chronic diastolic heart failure she will fully sodium restrict discontinue her thiazide diuretic sort of Baldwin low dose of loop diuretic weigh daily and follow-up BMP BMP in her PCP office in 1 week.  I will see her back in my office I suspect she will have Baldwin marked clinical improvement. 6. She has senile calcific disease I do not think she has any clinically significant valve dysfunction and I would not diagnose her  as mitral stenosis.  She is reassured. 7. Blood pressure is not at target she should respond to diuresis continue ACE inhibitor  8. Managed by her PCP 9. Reviewed her echocardiogram she does not have mitral stenosis has no significant mitral clinically I do not think she has any significant valvular heart disease and she is very reassured 10. Stable, she has no evidence of cardiomyopathy or asynchronous contractility despite left bundle branch block.   Next appointment: One month   Medication Adjustments/Labs and Tests Ordered: Current medicines are reviewed at length with the patient today.  Concerns regarding medicines are outlined above.  Orders Placed This Encounter  Procedures  . Basic metabolic panel  . Pro b natriuretic peptide (BNP)  . EKG 12-Lead   Meds ordered this encounter  Medications  . furosemide (LASIX) 20 MG tablet    Sig: Take 1 tablet (20 mg total) by mouth daily.    Dispense:  30 tablet    Refill:  11    Chief Complaint  Patient presents with  . New Patient (Initial Visit)    per Nicole Peace, Baldwin   . Cardiac Valve Problem    found on recent echo at [facility]  . Hypertension   Past Medical History:  Diagnosis Date  . Abnormal echocardiogram 02/03/2018  . Bilateral edema of lower extremity 02/03/2018  . Depression 02/03/2018  . Dyslipidemia 02/03/2018  . Essential hypertension 02/03/2018  . Fatigue 02/03/2018  . Hyperglycemia 02/03/2018  . Hyperlipidemia   . Hypothyroidism 02/03/2018  . Trigeminal neuralgia 02/03/2018    Past Surgical History:  Procedure Laterality Date  . CARDIAC CATHETERIZATION    . DILATION AND CURETTAGE OF UTERUS    . TUBAL LIGATION      Current Medications: Current Meds  Medication Sig  . aspirin EC 81 MG tablet Take 81 mg by mouth daily.  . Calcium Carb-Cholecalciferol (CALCIUM 600+D3) 600-200 MG-UNIT TABS Take 1 tablet by mouth 2 (two) times daily.   Marland Kitchen levothyroxine (SYNTHROID, LEVOTHROID) 50 MCG tablet Take 50 mcg by mouth daily  before breakfast.  . lisinopril (PRINIVIL,ZESTRIL) 40 MG tablet Take 40 mg by mouth  daily.  . metoprolol tartrate (LOPRESSOR) 50 MG tablet Take 50 mg by mouth 2 (two) times daily.  . Multiple Vitamin (MULTI VITAMIN DAILY PO) Take 1 tablet by mouth daily.  . Omega-3 Fatty Acids (FISH OIL) 1200 MG CAPS Take 1 capsule by mouth daily.  . simvastatin (ZOCOR) 20 MG tablet Take 20 mg by mouth daily.  . [DISCONTINUED] hydrochlorothiazide (HYDRODIURIL) 25 MG tablet Take 25 mg by mouth daily.     Allergies:   Patient has no known allergies.   Social History   Socioeconomic History  . Marital status: Unknown    Spouse name: Not on file  . Number of children: Not on file  . Years of education: Not on file  . Highest education level: Not on file  Occupational History  . Not on file  Social Needs  . Financial resource strain: Not on file  . Food insecurity:    Worry: Not on file    Inability: Not on file  . Transportation needs:    Medical: Not on file    Non-medical: Not on file  Tobacco Use  . Smoking status: Never Smoker  . Smokeless tobacco: Never Used  Substance and Sexual Activity  . Alcohol use: Not Currently  . Drug use: Not Currently  . Sexual activity: Not on file  Lifestyle  . Physical activity:    Days per week: Not on file    Minutes per session: Not on file  . Stress: Not on file  Relationships  . Social connections:    Talks on phone: Not on file    Gets together: Not on file    Attends religious service: Not on file    Active member of club or organization: Not on file    Attends meetings of clubs or organizations: Not on file    Relationship status: Not on file  Other Topics Concern  . Not on file  Social History Narrative  . Not on file     Family History: The patient's family history includes Breast cancer in her mother; Colon cancer in her son; Diabetes in her brother; Hypertension in her father; Stomach cancer in her son; Testicular cancer in her  son. ROS:   Please see the history of present illness.    All other systems reviewed and are negative.  EKGs/Labs/Other Studies Reviewed:    The following studies were reviewed today:  EKG:  EKG ordered today.  The ekg ordered today demonstrates sinus rhythm left bundle branch block  Recent Labs: No results found for requested labs within last 8760 hours.  Recent Lipid Panel No results found for: CHOL, TRIG, HDL, CHOLHDL, VLDL, LDLCALC, LDLDIRECT  Physical Exam:    VS:  BP (!) 154/90 (BP Location: Left Arm, Patient Position: Sitting, Cuff Size: Normal)   Pulse (!) 59   Ht 5\' 2"  (1.575 m)   Wt 217 lb (98.4 kg)   SpO2 96%   BMI 39.69 kg/m     Wt Readings from Last 3 Encounters:  02/11/18 217 lb (98.4 kg)     GEN:  Well nourished, well developed in no acute distress HEENT: Normal NECK:mild  JVD; No carotid bruits LYMPHATICS: No lymphadenopathy CARDIAC: RRR, no murmurs, rubs, gallops RESPIRATORY:  Clear to auscultation without rales, wheezing or rhonchi  ABDOMEN: Soft, non-tender, non-distended MUSCULOSKELETAL:  2-3+ to the knee bilateralyy edema; No deformity  SKIN: Warm and dry NEUROLOGIC:  Alert and oriented x 3 PSYCHIATRIC:  Normal affect    Signed, Nicole More,  MD  02/11/2018 4:47 PM    [facility]  No results found for: CHOL, TRIG, HDL, CHOLHDL, VLDL, LDLCALC, LDLDIRECT

## 2018-02-11 NOTE — Patient Instructions (Signed)
Medication Instructions:  Your physician has recommended you make the following change in your medication:  STOP hydrochlorothiazide START furosemide (Lasix) 20 mg daily  Labwork: Your physician recommends that you return for lab work in: 1 week at [facility] office. BMP, BNP  Testing/Procedures: You had an EKG today.  Follow-Up: Your physician recommends that you schedule a follow-up appointment in: 1 month.  Any Other Special Instructions Will Be Listed Below (If Applicable).     If you need a refill on your cardiac medications before your next appointment, please call your pharmacy.

## 2018-02-19 DIAGNOSIS — I447 Left bundle-branch block, unspecified: Secondary | ICD-10-CM | POA: Diagnosis not present

## 2018-02-19 DIAGNOSIS — R931 Abnormal findings on diagnostic imaging of heart and coronary circulation: Secondary | ICD-10-CM | POA: Diagnosis not present

## 2018-02-19 DIAGNOSIS — I5032 Chronic diastolic (congestive) heart failure: Secondary | ICD-10-CM | POA: Diagnosis not present

## 2018-02-19 DIAGNOSIS — I110 Hypertensive heart disease with heart failure: Secondary | ICD-10-CM | POA: Diagnosis not present

## 2018-02-19 DIAGNOSIS — I059 Rheumatic mitral valve disease, unspecified: Secondary | ICD-10-CM | POA: Diagnosis not present

## 2018-02-19 DIAGNOSIS — I10 Essential (primary) hypertension: Secondary | ICD-10-CM | POA: Diagnosis not present

## 2018-02-20 LAB — BASIC METABOLIC PANEL
BUN/Creatinine Ratio: 9 — ABNORMAL LOW (ref 12–28)
BUN: 7 mg/dL — AB (ref 8–27)
CALCIUM: 9.6 mg/dL (ref 8.7–10.3)
CHLORIDE: 99 mmol/L (ref 96–106)
CO2: 32 mmol/L — ABNORMAL HIGH (ref 20–29)
Creatinine, Ser: 0.75 mg/dL (ref 0.57–1.00)
GFR calc Af Amer: 89 mL/min/{1.73_m2} (ref >59)
GFR calc non Af Amer: 77 mL/min/{1.73_m2} (ref >59)
GLUCOSE: 96 mg/dL (ref 65–99)
Potassium: 4.3 mmol/L (ref 3.5–5.2)
Sodium: 136 mmol/L (ref 134–144)

## 2018-02-20 LAB — PRO B NATRIURETIC PEPTIDE: NT-PRO BNP: 136 pg/mL (ref 0–738)

## 2018-03-15 NOTE — Progress Notes (Signed)
Cardiology Office Note:    Date:  03/16/2018   ID:  Nicole Baldwin, DOB 1941-08-03, MRN [id number]  PCP:  Lowella Dandy, NP  Cardiologist:  Shirlee More, MD    Referring MD: Lowella Dandy, NP    ASSESSMENT:    1. Chronic diastolic heart failure ([place])   2. Hypertensive heart disease with heart failure ([place])    PLAN:    In order of problems listed above:  1. She remains edematous decompensated New York Heart Association class II.  I will switch her to a more potent loop diuretic and I asked her to comply with sodium restriction.  I asked her PCP to recheck a BMP and proBNP in the office next few weeks and I will plan to see in the office in 3 months or sooner if unimproved.  I think the predominant problem here is compliance with sodium restriction. 2. Blood pressure remains elevated again I think it should respond to diuresis and at this time I do not think she requires an additional antihypertensive agent   Next appointment: 3 months   Medication Adjustments/Labs and Tests Ordered: Current medicines are reviewed at length with the patient today.  Concerns regarding medicines are outlined above.  No orders of the defined types were placed in this encounter.  No orders of the defined types were placed in this encounter.   Chief Complaint  Patient presents with  . Follow-up  . Congestive Heart Failure  . Hypertension    History of Present Illness:    Nicole Baldwin is a 77 y.o. female with a hx of hypertension edema heart failure left bundle branch block last seen 02/11/18.  ASSESSMENT:    02/11/18   1. Chronic diastolic heart failure ([place])   2. Mitral annular calcification   3. Hypertensive heart disease with chronic diastolic congestive heart failure ([place])   4. Abnormal echocardiogram   5. LBBB (left bundle branch block)    PLAN:    1.    Clinically with hypertension edema orthopnea PND she has chronic diastolic heart failure she will fully sodium restrict  discontinue her thiazide diuretic sort of a low dose of loop diuretic weigh daily and follow-up BMP BMP in her PCP office in 1 week.  I will see her back in my office I suspect she will have a marked clinical improvement. 3. She has senile calcific disease I do not think she has any clinically significant valve dysfunction and I would not diagnose her as mitral stenosis.  She is reassured. 4. Blood pressure is not at target she should respond to diuresis continue ACE inhibitor  5. Managed by her PCP 6. Reviewed her echocardiogram she does not have mitral stenosis has no significant mitral clinically I do not think she has any significant valvular heart disease and she is very reassured 7. Stable, she has no evidence of cardiomyopathy or asynchronous contractility despite left bundle branch block.  Compliance with diet, lifestyle and medications: No, she continues to add salt to her diet Despite furosemide her weight is unchanged continued edema and exertional shortness of breath.  She remains fluid overloaded systolic blood pressure above target we will switch to torsemide and I asked her to begin to sodium restrict.  I suspect her heart failure will quickly come in the line and hypertension will achieve target. Past Medical History:  Diagnosis Date  . Abnormal echocardiogram 02/03/2018  . Bilateral edema of lower extremity 02/03/2018  . Depression 02/03/2018  . Dyslipidemia  02/03/2018  . Essential hypertension 02/03/2018  . Fatigue 02/03/2018  . Hyperglycemia 02/03/2018  . Hyperlipidemia   . Hypothyroidism 02/03/2018  . Trigeminal neuralgia 02/03/2018    Past Surgical History:  Procedure Laterality Date  . CARDIAC CATHETERIZATION    . DILATION AND CURETTAGE OF UTERUS    . TUBAL LIGATION      Current Medications: Current Meds  Medication Sig  . aspirin EC 81 MG tablet Take 81 mg by mouth daily.  . Calcium Carb-Cholecalciferol (CALCIUM 600+D3) 600-200 MG-UNIT TABS Take 1 tablet by mouth 2  (two) times daily.   . furosemide (LASIX) 20 MG tablet Take 1 tablet (20 mg total) by mouth daily.  Marland Kitchen levothyroxine (SYNTHROID, LEVOTHROID) 50 MCG tablet Take 50 mcg by mouth daily before breakfast.  . lisinopril (PRINIVIL,ZESTRIL) 40 MG tablet Take 40 mg by mouth daily.  . metoprolol tartrate (LOPRESSOR) 50 MG tablet Take 50 mg by mouth 2 (two) times daily.  . Multiple Vitamin (MULTI VITAMIN DAILY PO) Take 1 tablet by mouth daily.  . Omega-3 Fatty Acids (FISH OIL) 1200 MG CAPS Take 1 capsule by mouth daily.  . simvastatin (ZOCOR) 20 MG tablet Take 20 mg by mouth daily.     Allergies:   Patient has no known allergies.   Social History   Socioeconomic History  . Marital status: Unknown    Spouse name: Not on file  . Number of children: Not on file  . Years of education: Not on file  . Highest education level: Not on file  Occupational History  . Not on file  Social Needs  . Financial resource strain: Not on file  . Food insecurity:    Worry: Not on file    Inability: Not on file  . Transportation needs:    Medical: Not on file    Non-medical: Not on file  Tobacco Use  . Smoking status: Never Smoker  . Smokeless tobacco: Never Used  Substance and Sexual Activity  . Alcohol use: Not Currently  . Drug use: Not Currently  . Sexual activity: Not on file  Lifestyle  . Physical activity:    Days per week: Not on file    Minutes per session: Not on file  . Stress: Not on file  Relationships  . Social connections:    Talks on phone: Not on file    Gets together: Not on file    Attends religious service: Not on file    Active member of club or organization: Not on file    Attends meetings of clubs or organizations: Not on file    Relationship status: Not on file  Other Topics Concern  . Not on file  Social History Narrative  . Not on file     Family History: The patient's family history includes Breast cancer in her mother; Colon cancer in her son; Diabetes in her  brother; Hypertension in her father; Stomach cancer in her son; Testicular cancer in her son. ROS:   Please see the history of present illness.    All other systems reviewed and are negative.  EKGs/Labs/Other Studies Reviewed:    The following studies were reviewed today:   Recent Labs: 02/19/2018: BUN 7; Creatinine, Ser 0.75; NT-Pro BNP 136; Potassium 4.3; Sodium 136  Recent Lipid Panel No results found for: CHOL, TRIG, HDL, CHOLHDL, VLDL, LDLCALC, LDLDIRECT  Physical Exam:    VS:  BP (!) 178/92 (BP Location: Right Arm, Patient Position: Sitting, Cuff Size: Large)   Pulse (!) 59  Ht 5\' 2"  (1.575 m)   Wt 218 lb 6.4 oz (99.1 kg)   SpO2 96%   BMI 39.95 kg/m     Wt Readings from Last 3 Encounters:  03/16/18 218 lb 6.4 oz (99.1 kg)  02/11/18 217 lb (98.4 kg)     GEN:  Well nourished, well developed in no acute distress HEENT: Normal NECK: No JVD; No carotid bruits LYMPHATICS: No lymphadenopathy CARDIAC: RRR, no murmurs, rubs, gallops RESPIRATORY:  Clear to auscultation without rales, wheezing or rhonchi  ABDOMEN: Soft, non-tender, non-distended MUSCULOSKELETAL:  2+ TO THE KNEE BILATERAL  edema; No deformity  SKIN: Warm and dry NEUROLOGIC:  Alert and oriented x 3 PSYCHIATRIC:  Normal affect    Signed, Shirlee More, MD  03/16/2018 2:52 PM    [facility]

## 2018-03-16 ENCOUNTER — Encounter: Payer: Self-pay | Admitting: Cardiology

## 2018-03-16 ENCOUNTER — Ambulatory Visit: Payer: Medicare HMO | Admitting: Cardiology

## 2018-03-16 VITALS — BP 178/92 | HR 59 | Ht 62.0 in | Wt 218.4 lb

## 2018-03-16 DIAGNOSIS — I110 Hypertensive heart disease with heart failure: Secondary | ICD-10-CM | POA: Diagnosis not present

## 2018-03-16 DIAGNOSIS — I5032 Chronic diastolic (congestive) heart failure: Secondary | ICD-10-CM | POA: Diagnosis not present

## 2018-03-16 MED ORDER — TORSEMIDE 20 MG PO TABS: 20.0000 mg | ORAL_TABLET | Freq: Every day | ORAL | 3 refills | Status: DC

## 2018-03-16 NOTE — Patient Instructions (Addendum)
Medication Instructions:  Your physician has recommended you make the following change in your medication:  STOP furosemide START torsemide 20 mg daily  Labwork: None  Testing/Procedures: None  Follow-Up: Your physician recommends that you schedule a follow-up appointment in: 2 months.  Any Other Special Instructions Will Be Listed Below (If Applicable).     If you need a refill on your cardiac medications before your next appointment, please call your pharmacy.    KNOW YOUR HEART FAILURE ZONES  Green Zone (Your Goal):  Marland Kitchen No shortness of breath or trouble bleeding . No weight gain of more than 3 pounds in one day or 5 pounds in a week . No swelling in your ankles, feet, stomach, or hands . No chest discomfort, heaviness or pain  Yellow Zone (Call the Doctor to get help!): Having 1 or more of the following: . Weight gain of 3 pounds in 1 day or 5 pounds in 1 week . More swelling of your feet, ankles, stomach, or hands . It is harder to breath when lying down. You need to sit up . Chest discomfort, heaviness, or pain . You feel more tired or have less energy than normal . New or worsening dizziness . Dry hacking cough . You feel uneasy and you know something is not right  Red Zone (Call 911-EMERGENCY): . Struggling to breathe. This does not go away when you sit up. . Stronger and more regular amounts of chest discomfort . New confusion or can't think clearly . Fainting or near fainting   Resources form the American heart Association: [employer]uy Failure  Weigh yourself every morning when you first wake up and record on a calender or note pad, bring this to your office visits. Using a pill tender can help with taking your medications consistently.  Limit your fluid intake to 2 liters daily  Limit your sodium intake to less than 2-3 grams daily. Ask if you need  dietary teaching.  If you gain more than 3 pounds (from your dry weight ), double your dose of diuretic for the day.  If you gain more than 5 pounds (from your dry weight), double your dose of lasix and call your heart failure doctor.  Please do not smoke tobacco since it is very bad for your heart.  Please do not drink alcohol since it can worsen your heart failure.Also avoid OTC nonsteroidal drugs, such as advil, aleve and motrin.  Try to exercise for at least 30 minutes every day because this will help your heart be more efficient. You may be eligible for supervised cardiac rehab, ask your physician.

## 2018-03-23 ENCOUNTER — Telehealth: Payer: Self-pay | Admitting: Cardiology

## 2018-03-23 NOTE — Telephone Encounter (Signed)
Wants to know if she can cut her torsemide in half because her face and lips don't feel right

## 2018-03-24 NOTE — Telephone Encounter (Signed)
Started Monday night, lips were tingling, now up to the side of her eye, nose, and lips. The tingling feeling does come and go. No other symptoms. Patient believes this is related to her dose change on the torsemide. Please advise.

## 2018-03-24 NOTE — Telephone Encounter (Signed)
It is not due to a diuretic, would have her contact her PCP

## 2018-03-24 NOTE — Telephone Encounter (Signed)
Patient advised to continue torsemide, not related to these symptoms. Patient advised to contact primary care physician. Patient verbalized understanding. No further questions.

## 2018-03-29 DIAGNOSIS — S8012XA Contusion of left lower leg, initial encounter: Secondary | ICD-10-CM | POA: Diagnosis not present

## 2018-03-29 DIAGNOSIS — M79605 Pain in left leg: Secondary | ICD-10-CM | POA: Diagnosis not present

## 2018-04-21 DIAGNOSIS — N959 Unspecified menopausal and perimenopausal disorder: Secondary | ICD-10-CM | POA: Diagnosis not present

## 2018-04-21 DIAGNOSIS — Z1231 Encounter for screening mammogram for malignant neoplasm of breast: Secondary | ICD-10-CM | POA: Diagnosis not present

## 2018-04-21 DIAGNOSIS — M8589 Other specified disorders of bone density and structure, multiple sites: Secondary | ICD-10-CM | POA: Diagnosis not present

## 2018-05-04 DIAGNOSIS — R739 Hyperglycemia, unspecified: Secondary | ICD-10-CM | POA: Diagnosis not present

## 2018-05-04 DIAGNOSIS — I10 Essential (primary) hypertension: Secondary | ICD-10-CM | POA: Diagnosis not present

## 2018-05-04 DIAGNOSIS — Z6839 Body mass index (BMI) 39.0-39.9, adult: Secondary | ICD-10-CM | POA: Diagnosis not present

## 2018-05-04 DIAGNOSIS — R799 Abnormal finding of blood chemistry, unspecified: Secondary | ICD-10-CM | POA: Diagnosis not present

## 2018-05-04 DIAGNOSIS — Z1339 Encounter for screening examination for other mental health and behavioral disorders: Secondary | ICD-10-CM | POA: Diagnosis not present

## 2018-05-04 DIAGNOSIS — E669 Obesity, unspecified: Secondary | ICD-10-CM | POA: Diagnosis not present

## 2018-05-04 DIAGNOSIS — E785 Hyperlipidemia, unspecified: Secondary | ICD-10-CM | POA: Diagnosis not present

## 2018-06-22 NOTE — Progress Notes (Signed)
Cardiology Office Note:    Date:  06/23/2018   ID:  Nicole Baldwin, DOB [birth year], MRN [id number]  PCP:  Lowella Dandy, NP  Cardiologist:  Shirlee More, MD    Referring MD: Lowella Dandy, NP    ASSESSMENT:    1. Chronic diastolic heart failure ([place])   2. Hypertensive heart disease with heart failure ([place])   3. Mitral annular calcification    PLAN:    In order of problems listed above:  1. Unfortunately she remains fluid overloaded New York Heart Association class I to class to challenge her to fully sodium restrict will increase her loop diuretic. 2. Blood pressure not at target I think she will benefit from diuresis and I will switch her from metoprolol to carvedilol asked her to follow home blood pressures for 2 weeks after the change and bring a list to my office.  She required an additional antihypertensive agent hydralazine be appropriate 3. 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  Concerns regarding medicines are outlined above.  No orders of the defined types were placed in this encounter.  No orders of the defined types were placed in this encounter.   Chief Complaint  Patient presents with  . Congestive Heart Failure  . Hypertension    History of Present Illness:    Nicole Baldwin is a 77 y.o. female with a hx of hypertension edema heart failure left bundle branch block last seen 03/16/18. Compliance with diet, lifestyle and medications: Yes in general but she still has a small amount of dietary salt from time to time.  She weighs daily weights are stable does not use a pillbox and I asked her to start to do that. She is better not short of breath with activity sleeping on 2 pillows from a combination of orthopnea as  well as habit.  No chest pain palpitations syncope or TIA.  Recent labs reviewed from her PCP shows a cholesterol 150 HDL 46 LDL 79 and her normal creatinine this was 05/04/2018. Past Medical History:  Diagnosis Date  . Abnormal echocardiogram 02/03/2018  . Bilateral edema of lower extremity 02/03/2018  . Depression 02/03/2018  . Dyslipidemia 02/03/2018  . Essential hypertension 02/03/2018  . Fatigue 02/03/2018  . Hyperglycemia 02/03/2018  . Hyperlipidemia   . Hypothyroidism 02/03/2018  . Trigeminal neuralgia 02/03/2018    Past Surgical History:  Procedure Laterality Date  . CARDIAC CATHETERIZATION    . DILATION AND CURETTAGE OF UTERUS    . TUBAL LIGATION      Current Medications: Current Meds  Medication Sig  . aspirin EC 81 MG tablet Take 81 mg by mouth daily.  . Calcium Carb-Cholecalciferol (CALCIUM 600+D3) 600-200 MG-UNIT TABS Take 1 tablet by mouth 2 (two) times daily.   Marland Kitchen levothyroxine (SYNTHROID, LEVOTHROID) 50 MCG tablet Take 50 mcg by mouth daily before breakfast.  . lisinopril (PRINIVIL,ZESTRIL) 40 MG tablet Take 40 mg by mouth daily.  . metoprolol tartrate (LOPRESSOR) 50 MG tablet Take 50 mg by mouth 2 (two) times daily.  . Multiple Vitamin (MULTI VITAMIN DAILY PO) Take 1 tablet by mouth daily.  . Omega-3 Fatty Acids (FISH OIL) 1200 MG CAPS Take 1 capsule by mouth daily.  . simvastatin (ZOCOR) 20 MG tablet Take 20 mg  by mouth daily.  Marland Kitchen torsemide (DEMADEX) 20 MG tablet Take 1 tablet (20 mg total) by mouth daily.     Allergies:   Patient has no known allergies.   Social History   Socioeconomic History  . Marital status: Unknown    Spouse name: Not on file  . Number of children: Not on file  . Years of education: Not on file  . Highest education level: Not on file  Occupational History  . Not on file  Social Needs  . Financial resource strain: Not on file  . Food insecurity:    Worry: Not on file    Inability: Not on file  . Transportation needs:    Medical: Not on  file    Non-medical: Not on file  Tobacco Use  . Smoking status: Never Smoker  . Smokeless tobacco: Never Used  Substance and Sexual Activity  . Alcohol use: Not Currently  . Drug use: Not Currently  . Sexual activity: Not on file  Lifestyle  . Physical activity:    Days per week: Not on file    Minutes per session: Not on file  . Stress: Not on file  Relationships  . Social connections:    Talks on phone: Not on file    Gets together: Not on file    Attends religious service: Not on file    Active member of club or organization: Not on file    Attends meetings of clubs or organizations: Not on file    Relationship status: Not on file  Other Topics Concern  . Not on file  Social History Narrative  . Not on file     Family History: The patient's family history includes Breast cancer in her mother; Colon cancer in her son; Diabetes in her brother; Hypertension in her father; Stomach cancer in her son; Testicular cancer in her son. ROS:   Please see the history of present illness.    All other systems reviewed and are negative.  EKGs/Labs/Other Studies Reviewed:    The following studies were reviewed today:  Recent Labs: 02/19/2018: BUN 7; Creatinine, Ser 0.75; NT-Pro BNP 136; Potassium 4.3; Sodium 136  Recent Lipid Panel No results found for: CHOL, TRIG, HDL, CHOLHDL, VLDL, LDLCALC, LDLDIRECT  Physical Exam:    VS:  BP (!) 182/92 (BP Location: Right Arm, Patient Position: Sitting, Cuff Size: Large)   Pulse 73   Ht 5\' 2"  (1.575 m)   Wt 216 lb 9.6 oz (98.2 kg)   SpO2 96%   BMI 39.62 kg/m     Wt Readings from Last 3 Encounters:  06/23/18 216 lb 9.6 oz (98.2 kg)  03/16/18 218 lb 6.4 oz (99.1 kg)  02/11/18 217 lb (98.4 kg)     GEN:  Well nourished, well developed in no acute distress HEENT: Normal NECK: No JVD; No carotid bruits LYMPHATICS: No lymphadenopathy CARDIAC: 1 of 6 apical MR RRR, no murmurs, rubs, gallops RESPIRATORY:  Clear to auscultation without  rales, wheezing or rhonchi  ABDOMEN: Soft, non-tender, non-distended MUSCULOSKELETAL: She has 2+ bilateral equal to knee edema; No deformity  SKIN: Warm and dry NEUROLOGIC:  Alert and oriented x 3 PSYCHIATRIC:  Normal affect    Signed, Shirlee More, MD  06/23/2018 11:30 AM    [facility]

## 2018-06-23 ENCOUNTER — Ambulatory Visit (INDEPENDENT_AMBULATORY_CARE_PROVIDER_SITE_OTHER): Payer: Medicare HMO | Admitting: Cardiology

## 2018-06-23 ENCOUNTER — Encounter: Payer: Self-pay | Admitting: Cardiology

## 2018-06-23 VITALS — BP 182/92 | HR 73 | Ht 62.0 in | Wt 216.6 lb

## 2018-06-23 DIAGNOSIS — I059 Rheumatic mitral valve disease, unspecified: Secondary | ICD-10-CM

## 2018-06-23 DIAGNOSIS — I3481 Nonrheumatic mitral (valve) annulus calcification: Secondary | ICD-10-CM

## 2018-06-23 DIAGNOSIS — I5032 Chronic diastolic (congestive) heart failure: Secondary | ICD-10-CM

## 2018-06-23 DIAGNOSIS — I110 Hypertensive heart disease with heart failure: Secondary | ICD-10-CM | POA: Diagnosis not present

## 2018-06-23 MED ORDER — TORSEMIDE 20 MG PO TABS: 20.0000 mg | ORAL_TABLET | Freq: Every day | ORAL | 3 refills | Status: DC

## 2018-06-23 MED ORDER — CARVEDILOL 12.5 MG PO TABS: 12.5000 mg | ORAL_TABLET | Freq: Two times a day (BID) | ORAL | 3 refills | Status: DC

## 2018-06-23 NOTE — Patient Instructions (Signed)
Medication Instructions:  Your physician has recommended you make the following change in your medication:   STOP metoprolol   START carvedilol (coreg) 12.5 mg twice daily  CHANGE torsemide (demadex): take 1 tablet (20 mg) daily, take 2 tablets (40 mg) on Mondays and Thursday  Labwork: None  Testing/Procedures: None  Follow-Up: Your physician wants you to follow-up in: 3 months. You will receive a reminder letter in the mail two months in advance. If you don't receive a letter, please call our office to schedule the follow-up appointment.  **Please check your blood pressure daily and keep a log of these readings for the next two weeks. Once this has been done for two full weeks, drop off your log for Dr. Bettina Gavia to review.    If you need a refill on your cardiac medications before your next appointment, please call your pharmacy.   Thank you for choosing [facility]! Robyne Peers, RN [phone]

## 2018-08-10 DIAGNOSIS — I10 Essential (primary) hypertension: Secondary | ICD-10-CM | POA: Diagnosis not present

## 2018-08-10 DIAGNOSIS — E039 Hypothyroidism, unspecified: Secondary | ICD-10-CM | POA: Diagnosis not present

## 2018-08-10 DIAGNOSIS — E785 Hyperlipidemia, unspecified: Secondary | ICD-10-CM | POA: Diagnosis not present

## 2018-08-10 DIAGNOSIS — Z6841 Body Mass Index (BMI) 40.0 and over, adult: Secondary | ICD-10-CM | POA: Diagnosis not present

## 2018-08-10 DIAGNOSIS — E669 Obesity, unspecified: Secondary | ICD-10-CM | POA: Diagnosis not present

## 2018-08-10 DIAGNOSIS — R739 Hyperglycemia, unspecified: Secondary | ICD-10-CM | POA: Diagnosis not present

## 2018-08-17 DIAGNOSIS — I5032 Chronic diastolic (congestive) heart failure: Secondary | ICD-10-CM | POA: Diagnosis not present

## 2018-08-17 DIAGNOSIS — Z6841 Body Mass Index (BMI) 40.0 and over, adult: Secondary | ICD-10-CM | POA: Diagnosis not present

## 2018-08-17 DIAGNOSIS — I10 Essential (primary) hypertension: Secondary | ICD-10-CM | POA: Diagnosis not present

## 2018-09-23 ENCOUNTER — Ambulatory Visit: Payer: Medicare HMO | Admitting: Cardiology

## 2018-09-30 ENCOUNTER — Ambulatory Visit: Payer: Medicare HMO | Admitting: Cardiology

## 2018-09-30 ENCOUNTER — Encounter: Payer: Self-pay | Admitting: Cardiology

## 2018-09-30 VITALS — BP 158/90 | HR 66 | Ht 62.0 in | Wt 221.0 lb

## 2018-09-30 DIAGNOSIS — I5032 Chronic diastolic (congestive) heart failure: Secondary | ICD-10-CM

## 2018-09-30 DIAGNOSIS — I110 Hypertensive heart disease with heart failure: Secondary | ICD-10-CM

## 2018-09-30 DIAGNOSIS — I050 Rheumatic mitral stenosis: Secondary | ICD-10-CM | POA: Diagnosis not present

## 2018-09-30 MED ORDER — TORSEMIDE 20 MG PO TABS: 20.0000 mg | ORAL_TABLET | Freq: Two times a day (BID) | ORAL | 2 refills | Status: DC

## 2018-09-30 NOTE — Patient Instructions (Signed)
Medication Instructions:  Your physician has recommended you make the following change in your medication:   STOP: furosemide START: torsemide 20mg  one tablet twice daily  If you need a refill on your cardiac medications before your next appointment, please call your pharmacy.   Lab work: You will have lab work in 1 week BMP and ProBNP If you have labs (blood work) drawn today and your tests are completely normal, you will receive your results only by: Marland Kitchen MyChart Message (if you have MyChart) OR . A paper copy in the mail If you have any lab test that is abnormal or we need to change your treatment, we will call you to review the results.  Testing/Procedures: Your physician has requested that you have an echocardiogram. Echocardiography is a painless test that uses sound waves to create images of your heart. It provides your doctor with information about the size and shape of your heart and how well your heart's chambers and valves are working. This procedure takes approximately one hour. There are no restrictions for this procedure.    Follow-Up: At [facility], you and your health needs are our priority.  As part of our continuing mission to provide you with exceptional heart care, we have created designated Provider Care Teams.  These Care Teams include your primary Cardiologist (physician) and Advanced Practice Providers (APPs -  Physician Assistants and Nurse Practitioners) who all work together to provide you with the care you need, when you need it. You will need a follow up appointment in 1-2 weeks.

## 2018-09-30 NOTE — Progress Notes (Signed)
Cardiology Office Note:    Date:  09/30/2018   ID:  [name], DOB 07-26-1941, MRN [id number]  PCP:  Nicole Dandy, NP  Cardiologist:  Shirlee More, MD    Referring MD: Nicole Dandy, NP    ASSESSMENT:    1. Chronic diastolic heart failure ([place])   2. Hypertensive heart disease with heart failure ([place])   3. Mitral valve stenosis, unspecified etiology   4. Hypertensive heart disease with chronic diastolic congestive heart failure ([place])    PLAN:    In order of problems listed above:  1. Remains decompensated New York Heart Association class III volume overloaded increase diuretic recheck echocardiogram may require admission and IV diuretics and recheck renal function proBNP 2. Improved but still not on target should respond to diuresis and continue her beta-blocker ACE inhibitor 3. Recheck echocardiogram as suspect is more severe than apparent on echocardiogram performed in June   Next appointment: 2 weeks   Medication Adjustments/Labs and Tests Ordered: Current medicines are reviewed at length with the patient today.  Concerns regarding medicines are outlined above.  Orders Placed This Encounter  Procedures  . Basic metabolic panel  . Pro b natriuretic peptide (BNP)  . ECHOCARDIOGRAM COMPLETE   Meds ordered this encounter  Medications  . torsemide (DEMADEX) 20 MG tablet    Sig: Take 1 tablet (20 mg total) by mouth 2 (two) times daily.    Dispense:  60 tablet    Refill:  2    Chief Complaint  Patient presents with  . Follow-up  . Hypertension    History of Present Illness:    Nicole Baldwin is a 77 y.o. female with a hx of hypertension edema heart failure left bundle branch block  last seen 06/23/18.  At that time she remained decompensated edematous and diuretics were increased. Compliance with diet, lifestyle and medications: Yes however she still add salt to her diet  She is not doing well she is increasingly edematous short of breath and has had no  response to outpatient diuretics in some fashion she is off torsemide and is back on furosemide.  She acknowledges she still add salt to her diet her blood pressures been poorly controlled saw her primary care physician and the dose of carvedilol was increased home blood pressures since then have been better typically less than 240 systolic.  Despite this she has not done well she is increasingly short of breath with any activities as well as orthopnea.  I reviewed her records she has a degree of mitral stenosis I am concerned it is more severe than apparent on the echocardiogram performed in the summer at [place] we will repeat an echocardiogram intensify her diuretic therapy and see back in the office in follow-up.  If unimproved she will require admission and IV diuretics.  I strongly encouraged her to fully sodium restrict and continue to weigh daily  Past Medical History:  Diagnosis Date  . Abnormal echocardiogram 02/03/2018  . Bilateral edema of lower extremity 02/03/2018  . Depression 02/03/2018  . Dyslipidemia 02/03/2018  . Essential hypertension 02/03/2018  . Fatigue 02/03/2018  . Hyperglycemia 02/03/2018  . Hyperlipidemia   . Hypothyroidism 02/03/2018  . Trigeminal neuralgia 02/03/2018    Past Surgical History:  Procedure Laterality Date  . CARDIAC CATHETERIZATION    . DILATION AND CURETTAGE OF UTERUS    . TUBAL LIGATION      Current Medications: Current Meds  Medication Sig  . aspirin EC 81 MG  tablet Take 81 mg by mouth daily.  . Calcium Carb-Cholecalciferol (CALCIUM 600+D3) 600-200 MG-UNIT TABS Take 1 tablet by mouth 2 (two) times daily.   . carvedilol (COREG) 25 MG tablet Take 25 mg by mouth 2 (two) times daily with a meal.  . levothyroxine (SYNTHROID, LEVOTHROID) 50 MCG tablet Take 50 mcg by mouth daily before breakfast.  . lisinopril (PRINIVIL,ZESTRIL) 40 MG tablet Take 40 mg by mouth daily.  . Multiple Vitamin (MULTI VITAMIN DAILY PO) Take 1 tablet by mouth daily.  .  Omega-3 Fatty Acids (FISH OIL) 1200 MG CAPS Take 1 capsule by mouth daily.  . simvastatin (ZOCOR) 20 MG tablet Take 20 mg by mouth daily.  . [DISCONTINUED] furosemide (LASIX) 20 MG tablet Take 20 mg by mouth daily. 1 tablet daily excepts 2 tablets on Tues, Thurs, and Saturdays     Allergies:   Patient has no known allergies.   Social History   Socioeconomic History  . Marital status: Unknown    Spouse name: Not on file  . Number of children: Not on file  . Years of education: Not on file  . Highest education level: Not on file  Occupational History  . Not on file  Social Needs  . Financial resource strain: Not on file  . Food insecurity:    Worry: Not on file    Inability: Not on file  . Transportation needs:    Medical: Not on file    Non-medical: Not on file  Tobacco Use  . Smoking status: Never Smoker  . Smokeless tobacco: Never Used  Substance and Sexual Activity  . Alcohol use: Not Currently  . Drug use: Not Currently  . Sexual activity: Not on file  Lifestyle  . Physical activity:    Days per week: Not on file    Minutes per session: Not on file  . Stress: Not on file  Relationships  . Social connections:    Talks on phone: Not on file    Gets together: Not on file    Attends religious service: Not on file    Active member of club or organization: Not on file    Attends meetings of clubs or organizations: Not on file    Relationship status: Not on file  Other Topics Concern  . Not on file  Social History Narrative  . Not on file     Family History: The patient's family history includes Breast cancer in her mother; Colon cancer in her son; Diabetes in her brother; Hypertension in her father; Stomach cancer in her son; Testicular cancer in her son. ROS:   Please see the history of present illness.    All other systems reviewed and are negative.  EKGs/Labs/Other Studies Reviewed:    The following studies were reviewed today:   Recent Labs:   08/10/2018  cholesterol 155 LDL 83 HDL 50 creatinine 0.7 A1c 5.6% 02/19/2018: BUN 7; Creatinine, Ser 0.75; NT-Pro BNP 136; Potassium 4.3; Sodium 136  Recent Lipid Panel No results found for: CHOL, TRIG, HDL, CHOLHDL, VLDL, LDLCALC, LDLDIRECT  Physical Exam:    VS:  BP (!) 158/90 (BP Location: Right Arm, Patient Position: Sitting, Cuff Size: Normal)   Pulse 66   Ht 5\' 2"  (1.575 m)   Wt 221 lb (100.2 kg)   SpO2 95%   BMI 40.42 kg/m     Wt Readings from Last 3 Encounters:  09/30/18 221 lb (100.2 kg)  06/23/18 216 lb 9.6 oz (98.2 kg)  03/16/18 218 lb  6.4 oz (99.1 kg)     GEN: Very obese breathless at rest well nourished, well developed in no acute distress HEENT: Normal NECK: No JVD; No carotid bruits LYMPHATICS: No lymphadenopathy CARDIAC: Paradoxical second heart sound RRR, no murmurs, rubs, gallops RESPIRATORY:  Clear to auscultation without rales, wheezing or rhonchi  ABDOMEN: Soft, non-tender, non-distended MUSCULOSKELETAL: 4+ edema to the thighs bilaterally edema; No deformity  SKIN: Warm and dry NEUROLOGIC:  Alert and oriented x 3 PSYCHIATRIC:  Normal affect    Signed, Shirlee More, MD  09/30/2018 5:50 PM    [facility]

## 2018-10-07 DIAGNOSIS — I050 Rheumatic mitral stenosis: Secondary | ICD-10-CM | POA: Diagnosis not present

## 2018-10-07 DIAGNOSIS — I059 Rheumatic mitral valve disease, unspecified: Secondary | ICD-10-CM | POA: Diagnosis not present

## 2018-10-07 DIAGNOSIS — I5032 Chronic diastolic (congestive) heart failure: Secondary | ICD-10-CM | POA: Diagnosis not present

## 2018-10-07 DIAGNOSIS — I110 Hypertensive heart disease with heart failure: Secondary | ICD-10-CM | POA: Diagnosis not present

## 2018-10-08 LAB — BASIC METABOLIC PANEL
BUN/Creatinine Ratio: 13 (ref 12–28)
BUN: 12 mg/dL (ref 8–27)
CO2: 31 mmol/L — ABNORMAL HIGH (ref 20–29)
Calcium: 9.3 mg/dL (ref 8.7–10.3)
Chloride: 94 mmol/L — ABNORMAL LOW (ref 96–106)
Creatinine, Ser: 0.89 mg/dL (ref 0.57–1.00)
GFR calc Af Amer: 72 mL/min/{1.73_m2} (ref >59)
GFR, EST NON AFRICAN AMERICAN: 62 mL/min/{1.73_m2} (ref >59)
Glucose: 87 mg/dL (ref 65–99)
Potassium: 4 mmol/L (ref 3.5–5.2)
SODIUM: 139 mmol/L (ref 134–144)

## 2018-10-08 LAB — PRO B NATRIURETIC PEPTIDE: NT-Pro BNP: 166 pg/mL (ref 0–738)

## 2018-10-12 ENCOUNTER — Encounter: Payer: Self-pay | Admitting: Cardiology

## 2018-10-12 ENCOUNTER — Ambulatory Visit (HOSPITAL_BASED_OUTPATIENT_CLINIC_OR_DEPARTMENT_OTHER)
Admission: RE | Admit: 2018-10-12 | Discharge: 2018-10-12 | Disposition: A | Discharge status: Home / Self Care | Payer: Medicare HMO | Source: Ambulatory Visit | Attending: Cardiology | Admitting: Cardiology

## 2018-10-12 ENCOUNTER — Ambulatory Visit: Payer: Medicare HMO | Admitting: Cardiology

## 2018-10-12 VITALS — BP 138/72 | HR 70 | Ht 62.0 in | Wt 221.1 lb

## 2018-10-12 DIAGNOSIS — I050 Rheumatic mitral stenosis: Secondary | ICD-10-CM

## 2018-10-12 DIAGNOSIS — I342 Nonrheumatic mitral (valve) stenosis: Secondary | ICD-10-CM | POA: Diagnosis not present

## 2018-10-12 DIAGNOSIS — I5032 Chronic diastolic (congestive) heart failure: Secondary | ICD-10-CM

## 2018-10-12 DIAGNOSIS — I110 Hypertensive heart disease with heart failure: Secondary | ICD-10-CM

## 2018-10-12 HISTORY — DX: Rheumatic mitral stenosis: I05.0

## 2018-10-12 MED ORDER — TORSEMIDE 20 MG PO TABS: ORAL_TABLET | ORAL | 1 refills | Status: DC

## 2018-10-12 NOTE — Progress Notes (Signed)
  Echocardiogram 2D Echocardiogram has been performed.  Adelaido Nicklaus T Barby Colvard 10/12/2018, 11:27 AM

## 2018-10-12 NOTE — Progress Notes (Signed)
Cardiology Office Note:    Date:  10/12/2018   ID:  SYNDA BAGENT, DOB January 01, 1941, MRN [id number]  PCP:  Lowella Dandy, NP  Cardiologist:  Shirlee More, MD    Referring MD: Lowella Dandy, NP    ASSESSMENT:    1. Hypertensive heart disease with heart failure ([place])   2. Nonrheumatic mitral valve stenosis   3. Chronic diastolic heart failure ([facility])    PLAN:    In order of problems listed above:  1. Heart failure remains decompensated stressed the need for compliance and sodium restriction increase diuretic until she achieves a weight of 212 pounds, 6 to 7 pound weight loss.  Next visit recheck renal function proBNP 2. Stable IMA dynamically mild to moderate I reassured her that I do not think in her lifetime she will ever need valvular intervention 3. See above decompensated diuretic increased   Next appointment: 4 weeks   Medication Adjustments/Labs and Tests Ordered: Current medicines are reviewed at length with the patient today.  Concerns regarding medicines are outlined above.  No orders of the defined types were placed in this encounter.  Meds ordered this encounter  Medications  . torsemide (DEMADEX) 20 MG tablet    Sig: Take 1 tablet three times daily until weight is below 212 then decrease to 1 tablet twice daily.    Dispense:  90 tablet    Refill:  1    Chief Complaint  Patient presents with  . Follow-up    after echo  . Congestive Heart Failure    History of Present Illness:    Rendi Mapel Billard is a 77 y.o. female with a hx of hypertension, edema, heart failure, left bundle branch block and mitral stenosis last seen 09/30/18 with decompensated heart failure and fluid overload.. Compliance with diet, lifestyle and medications: Yes  Echocardiogram performed today shows senile mitral valve disease with mild to moderate functional stenosis mild regurgitation and normal ejection fraction.  She does not have severe mitral valve disease to account for her  persistent and refractory heart failure.  Although she is less short of breath edema persists and her weight has not changed at home.  She is trying to sodium restriction he does not use a pillbox I stressed the need for compliance with sodium restriction recording her daily weights and a pillbox for sure that she takes her medications.  We will increase her diuretic dosage 50% of her weight decreases 212 pounds and see back in the office in several weeks I was going to check labs today however they are just done in the last week.  No angina palpitation or syncope she is not having orthopnea but has marked edema. Past Medical History:  Diagnosis Date  . Abnormal echocardiogram 02/03/2018  . Bilateral edema of lower extremity 02/03/2018  . Depression 02/03/2018  . Dyslipidemia 02/03/2018  . Essential hypertension 02/03/2018  . Fatigue 02/03/2018  . Hyperglycemia 02/03/2018  . Hyperlipidemia   . Hypothyroidism 02/03/2018  . Trigeminal neuralgia 02/03/2018    Past Surgical History:  Procedure Laterality Date  . CARDIAC CATHETERIZATION    . DILATION AND CURETTAGE OF UTERUS    . TUBAL LIGATION      Current Medications: Current Meds  Medication Sig  . aspirin EC 81 MG tablet Take 81 mg by mouth daily.  . Calcium Carb-Cholecalciferol (CALCIUM 600+D3) 600-200 MG-UNIT TABS Take 1 tablet by mouth 2 (two) times daily.   . carvedilol (COREG) 25 MG tablet Take 25 mg by  mouth 2 (two) times daily with a meal.  . levothyroxine (SYNTHROID, LEVOTHROID) 50 MCG tablet Take 50 mcg by mouth daily before breakfast.  . lisinopril (PRINIVIL,ZESTRIL) 40 MG tablet Take 40 mg by mouth daily.  . Multiple Vitamin (MULTI VITAMIN DAILY PO) Take 1 tablet by mouth daily.  . Omega-3 Fatty Acids (FISH OIL) 1200 MG CAPS Take 1 capsule by mouth daily.  . simvastatin (ZOCOR) 20 MG tablet Take 20 mg by mouth daily.  Marland Kitchen torsemide (DEMADEX) 20 MG tablet Take 1 tablet three times daily until weight is below 212 then decrease to 1  tablet twice daily.  . [DISCONTINUED] torsemide (DEMADEX) 20 MG tablet Take 1 tablet (20 mg total) by mouth 2 (two) times daily.     Allergies:   Patient has no known allergies.   Social History   Socioeconomic History  . Marital status: Unknown    Spouse name: Not on file  . Number of children: Not on file  . Years of education: Not on file  . Highest education level: Not on file  Occupational History  . Not on file  Social Needs  . Financial resource strain: Not on file  . Food insecurity:    Worry: Not on file    Inability: Not on file  . Transportation needs:    Medical: Not on file    Non-medical: Not on file  Tobacco Use  . Smoking status: Never Smoker  . Smokeless tobacco: Never Used  Substance and Sexual Activity  . Alcohol use: Not Currently  . Drug use: Not Currently  . Sexual activity: Not on file  Lifestyle  . Physical activity:    Days per week: Not on file    Minutes per session: Not on file  . Stress: Not on file  Relationships  . Social connections:    Talks on phone: Not on file    Gets together: Not on file    Attends religious service: Not on file    Active member of club or organization: Not on file    Attends meetings of clubs or organizations: Not on file    Relationship status: Not on file  Other Topics Concern  . Not on file  Social History Narrative  . Not on file     Family History: The patient's family history includes Breast cancer in her mother; Colon cancer in her son; Diabetes in her brother; Hypertension in her father; Stomach cancer in her son; Testicular cancer in her son. ROS:   Please see the history of present illness.    All other systems reviewed and are negative.  EKGs/Labs/Other Studies Reviewed:    The following studies were reviewed today:  Echocardiogram done today reviewed with the patient and daughter, she has senile calcific mitral valve disease mild to moderate stenosis normal ejection fraction  Recent  Labs: 10/07/2018: BUN 12; Creatinine, Ser 0.89; NT-Pro BNP 166; Potassium 4.0; Sodium 139  Recent Lipid Panel No results found for: CHOL, TRIG, HDL, CHOLHDL, VLDL, LDLCALC, LDLDIRECT  Physical Exam:    VS:  BP 138/72 (BP Location: Right Arm, Patient Position: Sitting, Cuff Size: Large)   Pulse 70   Ht 5\' 2"  (1.575 m)   Wt 221 lb 1.9 oz (100.3 kg)   SpO2 94%   BMI 40.44 kg/m     Wt Readings from Last 3 Encounters:  10/12/18 221 lb 1.9 oz (100.3 kg)  09/30/18 221 lb (100.2 kg)  06/23/18 216 lb 9.6 oz (98.2 kg)  GEN:  Well nourished, well developed in no acute distress HEENT: Normal NECK: No JVD; No carotid bruits LYMPHATICS: No lymphadenopathy CARDIAC: RRR, no murmurs, rubs, gallops RESPIRATORY:  Clear to auscultation without rales, wheezing or rhonchi  ABDOMEN: Soft, non-tender, non-distended MUSCULOSKELETAL:  2=bilateral to the knee edema; No deformity  SKIN: Warm and dry NEUROLOGIC:  Alert and oriented x 3 PSYCHIATRIC:  Normal affect    Signed, Shirlee More, MD  10/12/2018 3:59 PM    [place]

## 2018-10-12 NOTE — Patient Instructions (Addendum)
Medication Instructions:  Your physician has recommended you make the following change in your medication:   INCREASE torsemide (demadex) 25 mg: Take 1 tablet three times daily until your weight is below 212 pounds, then DECREASE to 1 tablet twice daily  If you need a refill on your cardiac medications before your next appointment, please call your pharmacy.   Lab work: None  If you have labs (blood work) drawn today and your tests are completely normal, you will receive your results only by: Marland Kitchen MyChart Message (if you have MyChart) OR . A paper copy in the mail If you have any lab test that is abnormal or we need to change your treatment, we will call you to review the results.  Testing/Procedures: None  Follow-Up: At [facility], you and your health needs are our priority.  As part of our continuing mission to provide you with exceptional heart care, we have created designated Provider Care Teams.  These Care Teams include your primary Cardiologist (physician) and Advanced Practice Providers (APPs -  Physician Assistants and Nurse Practitioners) who all work together to provide you with the care you need, when you need it. You will need a follow up appointment in 6 weeks.       DASH diet: Healthy eating to lower your blood pressure The DASH diet emphasizes portion size, eating a variety of foods and getting the right amount of nutrients. Discover how DASH can improve your health and lower your blood pressure. By [facility] Staff  DASH stands for Dietary Approaches to Stop Hypertension. The DASH diet is a lifelong approach to healthy eating that's designed to help treat or prevent high blood pressure (hypertension). The DASH diet encourages you to reduce the sodium in your diet and eat a variety of foods rich in nutrients that help lower blood pressure, such as potassium, calcium and magnesium. By following the DASH diet, you may be able to reduce your blood pressure by a few points  in just two weeks. Over time, your systolic blood pressure could drop by eight to 14 points, which can make a significant difference in your health risks. Because the DASH diet is a healthy way of eating, it offers health benefits besides just lowering blood pressure. The DASH diet is also in line with dietary recommendations to prevent osteoporosis, cancer, heart disease, stroke and diabetes. DASH diet: Sodium levels The DASH diet emphasizes vegetables, fruits and low-fat dairy foods - and moderate amounts of whole grains, fish, poultry and nuts. In addition to the standard DASH diet, there is also a lower sodium version of the diet. You can choose the version of the diet that meets your health needs: Standard DASH diet. You can consume up to 2,300 milligrams (mg) of sodium a day.  Lower sodium DASH diet. You can consume up to 1,500 mg of sodium a day. Both versions of the DASH diet aim to reduce the amount of sodium in your diet compared with what you might get in a typical American diet, which can amount to a whopping 3,400 mg of sodium a day or more. The standard DASH diet meets the recommendation from the Dietary Guidelines for Americans to keep daily sodium intake to less than 2,300 mg a day. The American Heart Association recommends 1,500 mg a day of sodium as an upper limit for all adults. If you aren't sure what sodium level is right for you, talk to your doctor. DASH diet: What to eat Both versions of the DASH  diet include lots of whole grains, fruits, vegetables and low-fat dairy products. The DASH diet also includes some fish, poultry and legumes, and encourages a small amount of nuts and seeds a few times a week.  You can eat red meat, sweets and fats in small amounts. The DASH diet is low in saturated fat, cholesterol and total fat. Here's a look at the recommended servings from each food group for the 2,000-calorie-a-day DASH diet. Grains: 6 to 8 servings a day Grains include bread,  cereal, rice and pasta. Examples of one serving of grains include 1 slice whole-wheat bread, 1 ounce dry cereal, or 1/2 cup cooked cereal, rice or pasta. Focus on whole grains because they have more fiber and nutrients than do refined grains. For instance, use brown rice instead of white rice, whole-wheat pasta instead of regular pasta and whole-grain bread instead of white bread. Look for products labeled "100 percent whole grain" or "100 percent whole wheat."  Grains are naturally low in fat. Keep them this way by avoiding butter, cream and cheese sauces. Vegetables: 4 to 5 servings a day Tomatoes, carrots, broccoli, sweet potatoes, greens and other vegetables are full of fiber, vitamins, and such minerals as potassium and magnesium. Examples of one serving include 1 cup raw leafy green vegetables or 1/2 cup cut-up raw or cooked vegetables. Don't think of vegetables only as side dishes - a hearty blend of vegetables served over brown rice or whole-wheat noodles can serve as the main dish for a meal.  Fresh and frozen vegetables are both good choices. When buying frozen and canned vegetables, choose those labeled as low sodium or without added salt.  To increase the number of servings you fit in daily, be creative. In a stir-fry, for instance, cut the amount of meat in half and double up on the vegetables. Fruits: 4 to 5 servings a day Many fruits need [place] preparation to become a healthy part of a meal or snack. Like vegetables, they're packed with fiber, potassium and magnesium and are typically low in fat - coconuts are an exception. Examples of one serving include one medium fruit, 1/2 cup fresh, frozen or canned fruit, or 4 ounces of juice. Have a piece of fruit with meals and one as a snack, then round out your day with a dessert of fresh fruits topped with a dollop of low-fat yogurt.  Leave on edible peels whenever possible. The peels of apples, pears and most fruits with pits add interesting  texture to recipes and contain healthy nutrients and fiber.  Remember that citrus fruits and juices, such as grapefruit, can interact with certain medications, so check with your doctor or pharmacist to see if they're OK for you.  If you choose canned fruit or juice, make sure no sugar is added. Dairy: 2 to 3 servings a day Milk, yogurt, cheese and other dairy products are major sources of calcium, vitamin D and protein. But the key is to make sure that you choose dairy products that are low fat or fat-free because otherwise they can be a major source of fat - and most of it is saturated. Examples of one serving include 1 cup skim or 1 percent milk, 1 cup low fat yogurt, or 1 1/2 ounces part-skim cheese. Low-fat or fat-free frozen yogurt can help you boost the amount of dairy products you eat while offering a sweet treat. Add fruit for a healthy twist.  If you have trouble digesting dairy products, choose lactose-free products or consider  taking an over-the-counter product that contains the enzyme lactase, which can reduce or prevent the symptoms of lactose intolerance.  Go easy on regular and even fat-free cheeses because they are typically high in sodium. Lean meat, poultry and fish: 6 servings or fewer a day Meat can be a rich source of protein, B vitamins, iron and zinc. Choose lean varieties and aim for no more than 6 ounces a day. Cutting back on your meat portion will allow room for more vegetables. Trim away skin and fat from poultry and meat and then bake, broil, grill or roast instead of frying in fat.  Eat heart-healthy fish, such as salmon, herring and tuna. These types of fish are high in omega-3 fatty acids, which can help lower your total cholesterol. Nuts, seeds and legumes: 4 to 5 servings a week Almonds, sunflower seeds, kidney beans, peas, lentils and other foods in this family are good sources of magnesium, potassium and protein. They're also full of fiber and phytochemicals, which  are plant compounds that may protect against some cancers and cardiovascular disease. Serving sizes are small and are intended to be consumed only a few times a week because these foods are high in calories. Examples of one serving include 1/3 cup nuts, 2 tablespoons seeds, or 1/2 cup cooked beans or peas.  Nuts sometimes get a bad rap because of their fat content, but they contain healthy types of fat - monounsaturated fat and omega-3 fatty acids. They're high in calories, however, so eat them in moderation. Try adding them to stir-fries, salads or cereals.  Soybean-based products, such as tofu and tempeh, can be a good alternative to meat because they contain all of the amino acids your body needs to make a complete protein, just like meat. Fats and oils: 2 to 3 servings a day Fat helps your body absorb essential vitamins and helps your body's immune system. But too much fat increases your risk of heart disease, diabetes and obesity. The DASH diet strives for a healthy balance by limiting total fat to less than 30 percent of daily calories from fat, with a focus on the healthier monounsaturated fats. Examples of one serving include 1 teaspoon soft margarine, 1 tablespoon mayonnaise or 2 tablespoons salad dressing. Saturated fat and trans fat are the main dietary culprits in increasing your risk of coronary artery disease. DASH helps keep your daily saturated fat to less than 6 percent of your total calories by limiting use of meat, butter, cheese, whole milk, cream and eggs in your diet, along with foods made from lard, solid shortenings, and palm and coconut oils.  Avoid trans fat, commonly found in such processed foods as crackers, baked goods and fried items.  Read food labels on margarine and salad dressing so that you can choose those that are lowest in saturated fat and free of trans fat. Sweets: 5 servings or fewer a week You don't have to banish sweets entirely while following the DASH diet -  just go easy on them. Examples of one serving include 1 tablespoon sugar, jelly or jam, 1/2 cup sorbet, or 1 cup lemonade. When you eat sweets, choose those that are fat-free or low-fat, such as sorbets, fruit ices, jelly beans, hard candy, graham crackers or low-fat cookies.  Artificial sweeteners such as aspartame (NutraSweet, Equal) and sucralose (Splenda) may help satisfy your sweet tooth while sparing the sugar. But remember that you still must use them sensibly. It's OK to swap a diet cola for a regular cola,  but not in place of a more nutritious beverage such as low-fat milk or even plain water.  Cut back on added sugar, which has no nutritional value but can pack on calories. DASH diet: Alcohol and caffeine Drinking too much alcohol can increase blood pressure. The Dietary Guidelines for Americans recommends that men limit alcohol to no more than two drinks a day and women to one or less. The DASH diet doesn't address caffeine consumption. The influence of caffeine on blood pressure remains unclear. But caffeine can cause your blood pressure to rise at least temporarily. If you already have high blood pressure or if you think caffeine is affecting your blood pressure, talk to your doctor about your caffeine consumption. DASH diet and weight loss While the DASH diet is not a weight-loss program, you may indeed lose unwanted pounds because it can help guide you toward healthier food choices. The DASH diet generally includes about 2,000 calories a day. If you're trying to lose weight, you may need to eat fewer calories. You may also need to adjust your serving goals based on your individual circumstances - something your health care team can help you decide. Tips to cut back on sodium The foods at the core of the DASH diet are naturally low in sodium. So just by following the DASH diet, you're likely to reduce your sodium intake. You also reduce sodium further by: Using sodium-free spices or  flavorings with your food instead of salt  Not adding salt when cooking rice, pasta or hot cereal  Rinsing canned foods to remove some of the sodium  Buying foods labeled "no salt added," "sodium-free," "low sodium" or "very low sodium" One teaspoon of table salt has 2,325 mg of sodium. When you read food labels, you may be surprised at just how much sodium some processed foods contain. Even low-fat soups, canned vegetables, ready-to-eat cereals and sliced [place] from the local deli - foods you may have considered healthy - often have lots of sodium. You may notice a difference in taste when you choose low-sodium food and beverages. If things seem too bland, gradually introduce low-sodium foods and cut back on table salt until you reach your sodium goal. That'll give your palate time to adjust. Using salt-free seasoning blends or herbs and spices may also ease the transition. It can take several weeks for your taste buds to get used to less salty foods. Putting the pieces of the DASH diet together Try these strategies to get started on the DASH diet:  Change gradually. If you now eat only one or two servings of fruits or vegetables a day, try to add a serving at lunch and one at dinner. Rather than switching to all whole grains, start by making one or two of your grain servings whole grains. Increasing fruits, vegetables and whole grains gradually can also help prevent bloating or diarrhea that may occur if you aren't used to eating a diet with lots of fiber. You can also try over-the-counter products to help reduce gas from beans and vegetables.  Reward successes and forgive slip-ups. Reward yourself with a nonfood treat for your accomplishments - rent a movie, purchase a book or get together with a friend. Everyone slips, especially when learning something new. Remember that changing your lifestyle is a long-term process. Find out what triggered your setback and then just pick up where you left off with  the DASH diet.  Add physical activity. To boost your blood pressure lowering efforts even more, consider  increasing your physical activity in addition to following the DASH diet. Combining both the DASH diet and physical activity makes it more likely that you'll reduce your blood pressure.  Get support if you need it. If you're having trouble sticking to your diet, talk to your doctor or dietitian about it. You might get some tips that will help you stick to the DASH diet. Remember, healthy eating isn't an all-or-nothing proposition. What's most important is that, on average, you eat healthier foods with plenty of variety - both to keep your diet nutritious and to avoid boredom or extremes. And with the DASH diet, you can have both.

## 2018-11-11 DIAGNOSIS — I10 Essential (primary) hypertension: Secondary | ICD-10-CM | POA: Diagnosis not present

## 2018-11-11 DIAGNOSIS — E785 Hyperlipidemia, unspecified: Secondary | ICD-10-CM | POA: Diagnosis not present

## 2018-11-11 DIAGNOSIS — I5032 Chronic diastolic (congestive) heart failure: Secondary | ICD-10-CM | POA: Diagnosis not present

## 2018-11-11 DIAGNOSIS — Z6841 Body Mass Index (BMI) 40.0 and over, adult: Secondary | ICD-10-CM | POA: Diagnosis not present

## 2018-11-11 DIAGNOSIS — R739 Hyperglycemia, unspecified: Secondary | ICD-10-CM | POA: Diagnosis not present

## 2018-11-11 DIAGNOSIS — E039 Hypothyroidism, unspecified: Secondary | ICD-10-CM | POA: Diagnosis not present

## 2018-11-11 DIAGNOSIS — R140 Abdominal distension (gaseous): Secondary | ICD-10-CM | POA: Diagnosis not present

## 2018-11-30 ENCOUNTER — Ambulatory Visit: Payer: Medicare HMO | Admitting: Cardiology

## 2018-11-30 ENCOUNTER — Encounter: Payer: Self-pay | Admitting: Cardiology

## 2018-11-30 VITALS — BP 140/68 | HR 92 | Ht 62.0 in | Wt 222.8 lb

## 2018-11-30 DIAGNOSIS — I110 Hypertensive heart disease with heart failure: Secondary | ICD-10-CM | POA: Diagnosis not present

## 2018-11-30 DIAGNOSIS — I5032 Chronic diastolic (congestive) heart failure: Secondary | ICD-10-CM | POA: Diagnosis not present

## 2018-11-30 DIAGNOSIS — I342 Nonrheumatic mitral (valve) stenosis: Secondary | ICD-10-CM

## 2018-11-30 MED ORDER — TORSEMIDE 20 MG PO TABS: ORAL_TABLET | ORAL | 1 refills | Status: DC

## 2018-11-30 NOTE — Progress Notes (Signed)
Cardiology Office Note:    Date:  11/30/2018   ID:  Nicole Baldwin, DOB Mar 31, 1941, MRN [id number]  PCP:  Lowella Dandy, NP  Cardiologist:  Shirlee More, MD    Referring MD: Lowella Dandy, NP    ASSESSMENT:    1. Chronic diastolic heart failure ([place])   2. Hypertensive heart disease with heart failure ([place])   3. Nonrheumatic mitral valve stenosis    PLAN:    In order of problems listed above:  1. Unimproved despite higher dose of loop diuretic she eats outside of the home and actually her weight has gone up 3 pounds.  We had a nice discussion of sodium restriction she will reduce her out of the home eating by 50% put her diuretic back to 60 mg a day of torsemide and reassess in the office in a few weeks if she is affected weight gain and compensated we got a weight-based strategy at that point.  Plan to recheck renal function proBNP next visit 2. See above blood pressure target continue ACE inhibitor 3. Stable I do not think her heart failure is due to severe valvular heart disease   Next appointment: 3 to 4 weeks   Medication Adjustments/Labs and Tests Ordered: Current medicines are reviewed at length with the patient today.  Concerns regarding medicines are outlined above.  No orders of the defined types were placed in this encounter.  Meds ordered this encounter  Medications  . torsemide (DEMADEX) 20 MG tablet    Sig: Take 1 tablet (20 mg) three times daily    Dispense:  90 tablet    Refill:  1    Chief Complaint  Patient presents with  . Follow-up  . Congestive Heart Failure    History of Present Illness:    Nicole Baldwin is a 78 y.o. female with a hx of hypertension, edema, heart failure, left bundle branch block and mild to moderate mitral stenosis  And  Mild MR last seen 10/12/18.  Compliance with diet, lifestyle and medications: Though she is profoundly eating outside the home and despite higher dose diuretic her weight is gone up  Initially when she  increase torsemide she felt better edema improved less short of breath weight came down but unfortunately eats out of the home predominantly and despite the diuretic her weight is gone up she is not a short of breath but she is still edematous.  She will reduce her out of the home eating 50% switch to a potassium sodium supplement for her diet increase her loop diuretic and reassess in the office in a few weeks.  No orthopnea chest pain or syncope Past Medical History:  Diagnosis Date  . Abnormal echocardiogram 02/03/2018  . Bilateral edema of lower extremity 02/03/2018  . Depression 02/03/2018  . Dyslipidemia 02/03/2018  . Essential hypertension 02/03/2018  . Fatigue 02/03/2018  . Hyperglycemia 02/03/2018  . Hyperlipidemia   . Hypothyroidism 02/03/2018  . Trigeminal neuralgia 02/03/2018    Past Surgical History:  Procedure Laterality Date  . CARDIAC CATHETERIZATION    . DILATION AND CURETTAGE OF UTERUS    . TUBAL LIGATION      Current Medications: Current Meds  Medication Sig  . aspirin EC 81 MG tablet Take 81 mg by mouth daily.  . Calcium Carb-Cholecalciferol (CALCIUM 600+D3) 600-200 MG-UNIT TABS Take 1 tablet by mouth 2 (two) times daily.   . carvedilol (COREG) 25 MG tablet Take 25 mg by mouth 2 (two) times daily with a  meal.  . levothyroxine (SYNTHROID, LEVOTHROID) 50 MCG tablet Take 50 mcg by mouth daily before breakfast.  . lisinopril (PRINIVIL,ZESTRIL) 40 MG tablet Take 40 mg by mouth daily.  . Multiple Vitamin (MULTI VITAMIN DAILY PO) Take 1 tablet by mouth daily.  . Omega-3 Fatty Acids (FISH OIL) 1200 MG CAPS Take 1 capsule by mouth daily.  . simvastatin (ZOCOR) 20 MG tablet Take 20 mg by mouth daily.  Marland Kitchen torsemide (DEMADEX) 20 MG tablet Take 1 tablet (20 mg) three times daily  . [DISCONTINUED] torsemide (DEMADEX) 20 MG tablet Take 1 tablet three times daily until weight is below 212 then decrease to 1 tablet twice daily.     Allergies:   Patient has no known allergies.   Social  History   Socioeconomic History  . Marital status: Unknown    Spouse name: Not on file  . Number of children: Not on file  . Years of education: Not on file  . Highest education level: Not on file  Occupational History  . Not on file  Social Needs  . Financial resource strain: Not on file  . Food insecurity:    Worry: Not on file    Inability: Not on file  . Transportation needs:    Medical: Not on file    Non-medical: Not on file  Tobacco Use  . Smoking status: Never Smoker  . Smokeless tobacco: Never Used  Substance and Sexual Activity  . Alcohol use: Not Currently  . Drug use: Not Currently  . Sexual activity: Not on file  Lifestyle  . Physical activity:    Days per week: Not on file    Minutes per session: Not on file  . Stress: Not on file  Relationships  . Social connections:    Talks on phone: Not on file    Gets together: Not on file    Attends religious service: Not on file    Active member of club or organization: Not on file    Attends meetings of clubs or organizations: Not on file    Relationship status: Not on file  Other Topics Concern  . Not on file  Social History Narrative  . Not on file     Family History: The patient's family history includes Breast cancer in her mother; Colon cancer in her son; Diabetes in her brother; Hypertension in her father; Stomach cancer in her son; Testicular cancer in her son. ROS:   Please see the history of present illness.    All other systems reviewed and are negative.  EKGs/Labs/Other Studies Reviewed:    The following studies were reviewed today:  Recent Labs: 10/07/2018: BUN 12; Creatinine, Ser 0.89; NT-Pro BNP 166; Potassium 4.0; Sodium 139  Recent Lipid Panel No results found for: CHOL, TRIG, HDL, CHOLHDL, VLDL, LDLCALC, LDLDIRECT  Physical Exam:    VS:  BP 140/68 (BP Location: Right Arm, Patient Position: Sitting, Cuff Size: Large)   Pulse 92   Ht 5\' 2"  (1.575 m)   Wt 222 lb 12.8 oz (101.1 kg)    SpO2 93%   BMI 40.75 kg/m     Wt Readings from Last 3 Encounters:  11/30/18 222 lb 12.8 oz (101.1 kg)  10/12/18 221 lb 1.9 oz (100.3 kg)  09/30/18 221 lb (100.2 kg)     GEN:  Well nourished, well developed in no acute distress HEENT: Normal NECK: No JVD; No carotid bruits LYMPHATICS: No lymphadenopathy CARDIAC: RRR, no murmurs, rubs, gallops RESPIRATORY:  Clear to auscultation without  rales, wheezing or rhonchi  ABDOMEN: Soft, non-tender, non-distended MUSCULOSKELETAL:  2+ pretibial  edema; No deformity  SKIN: Warm and dry NEUROLOGIC:  Alert and oriented x 3 PSYCHIATRIC:  Normal affect    Signed, Shirlee More, MD  11/30/2018 3:50 PM    [facility]

## 2018-11-30 NOTE — Patient Instructions (Signed)
Medication Instructions:  Your physician has recommended you make the following change in your medication:   START taking torsemide three times per day (20 mg) tablet  If you need a refill on your cardiac medications before your next appointment, please call your pharmacy.   Lab work: NONE  Testing/Procedures: NONE  Follow-Up: At [facility], you and your health needs are our priority.  As part of our continuing mission to provide you with exceptional heart care, we have created designated Provider Care Teams.  These Care Teams include your primary Cardiologist (physician) and Advanced Practice Providers (APPs -  Physician Assistants and Nurse Practitioners) who all work together to provide you with the care you need, when you need it. You will need a follow up appointment in 3 weeks.     Any Other Special Instructions Will Be Listed Below Torsemide tablets What is this medicine? TORSEMIDE (TORE se mide) is a diuretic. It helps you make more urine and to lose salt and excess water from your body. This medicine is used to treat high blood pressure, and edema or swelling from heart, kidney, or liver disease. This medicine may be used for other purposes; ask your health care provider or pharmacist if you have questions. COMMON BRAND NAME(S): Demadex What should I tell my health care provider before I take this medicine? They need to know if you have any of these conditions: -abnormal blood electrolytes -diabetes -gout -heart disease -kidney disease -liver disease -small amounts of urine, or difficulty passing urine -an unusual or allergic reaction to torsemide, sulfa drugs, other medicines, foods, dyes, or preservatives -pregnant or trying to get pregnant -breast-feeding How should I use this medicine? Take this medicine by mouth with a glass of water. Follow the directions on the prescription label. You may take this medicine with or without food. If it upsets your stomach, take  it with food or milk. Do not take your medicine more often than directed. Remember that you will need to pass more urine after taking this medicine. Do not take your medicine at a time of day that will cause you problems. Do not take at bedtime. Talk to your pediatrician regarding the use of this medicine in children. Special care may be needed. Overdosage: If you think you have taken too much of this medicine contact a poison control center or emergency room at once. NOTE: This medicine is only for you. Do not share this medicine with others. What if I miss a dose? If you miss a dose, take it as soon as you can. If it is almost time for your next dose, take only that dose. Do not take double or extra doses. What may interact with this medicine? -alcohol -certain antibiotics given by injection certain heart medicines like digoxin -diuretics -lithium -medicines for diabetes -medicines for blood pressure -medicines for cholesterol like cholestyramine -medicines that relax muscles for surgery -NSAIDs, medicines for pain and inflammation, like ibuprofen or naproxen -OTC supplements like ginseng and ephedra -probenecid -steroid medicines like prednisone or cortisone This list may not describe all possible interactions. Give your health care provider a list of all the medicines, herbs, non-prescription drugs, or dietary supplements you use. Also tell them if you smoke, drink alcohol, or use illegal drugs. Some items may interact with your medicine. What should I watch for while using this medicine? Visit your doctor or health care professional for regular checks on your progress. Check your blood pressure regularly. Ask your doctor or health care professional what  your blood pressure should be, and when you should contact him or her. If you are a diabetic, check your blood sugar as directed. You may need to be on a special diet while taking this medicine. Check with your doctor. Also, ask how many  glasses of fluid you need to drink a day. You must not get dehydrated. You may get drowsy or dizzy. Do not drive, use machinery, or do anything that needs mental alertness until you know how this drug affects you. Do not stand or sit up quickly, especially if you are an older patient. This reduces the risk of dizzy or fainting spells. Alcohol can make you more drowsy and dizzy. Avoid alcoholic drinks. What side effects may I notice from receiving this medicine? Side effects that you should report to your doctor or health care professional as soon as possible: -allergic reactions such as skin rash or itching, hives, swelling of the lips, mouth, tongue or throat -blood in urine or stool -dry mouth -hearing loss or ringing in the ears -irregular heartbeat -muscle pain, weakness or cramps -pain or difficulty passing urine -unusually weak or tired -vomiting or diarrhea Side effects that usually do not require medical attention (report to your doctor or health care professional if they continue or are bothersome): -dizzy or lightheaded -headache -increased thirst -passing large amounts of urine -sexual difficulties -stomach pain, upset or nausea This list may not describe all possible side effects. Call your doctor for medical advice about side effects. You may report side effects to FDA at 1-800-FDA-1088. Where should I keep my medicine? Keep out of the reach of children. Store at room temperature between 15 and 30 degrees C (59 and 86 degrees F). Throw away any unused medicine after the expiration date. NOTE: This sheet is a summary. It may not cover all possible information. If you have questions about this medicine, talk to your doctor, pharmacist, or health care provider.  2019 Elsevier/Gold Standard (2008-06-15 11:35:45)

## 2018-12-09 DIAGNOSIS — R1084 Generalized abdominal pain: Secondary | ICD-10-CM | POA: Diagnosis not present

## 2018-12-09 DIAGNOSIS — R140 Abdominal distension (gaseous): Secondary | ICD-10-CM | POA: Diagnosis not present

## 2019-01-04 ENCOUNTER — Telehealth (INDEPENDENT_AMBULATORY_CARE_PROVIDER_SITE_OTHER): Payer: Medicare HMO | Admitting: Cardiology

## 2019-01-04 ENCOUNTER — Other Ambulatory Visit: Payer: Self-pay

## 2019-01-04 ENCOUNTER — Ambulatory Visit: Payer: Medicare HMO | Admitting: Cardiology

## 2019-01-04 VITALS — BP 160/91 | HR 62 | Ht 62.0 in | Wt 217.2 lb

## 2019-01-04 DIAGNOSIS — I110 Hypertensive heart disease with heart failure: Secondary | ICD-10-CM | POA: Diagnosis not present

## 2019-01-04 DIAGNOSIS — I5032 Chronic diastolic (congestive) heart failure: Secondary | ICD-10-CM

## 2019-01-04 DIAGNOSIS — I342 Nonrheumatic mitral (valve) stenosis: Secondary | ICD-10-CM

## 2019-01-04 NOTE — Patient Instructions (Signed)
Medication Instructions:   Your physician recommends that you continue on your current medications as directed. Please refer to the Current Medication list given to you today.  If you need a refill on your cardiac medications before your next appointment, please call your pharmacy.   Lab work:  None   If you have labs (blood work) drawn today and your tests are completely normal, you will receive your results only by: Marland Kitchen MyChart Message (if you have MyChart) OR . A paper copy in the mail If you have any lab test that is abnormal or we need to change your treatment, we will call you to review the results.  Testing/Procedures:  None  Follow-Up:   3 months in office [place]   Any Other Special Instructions Will Be Listed Below (If Applicable)  None

## 2019-01-04 NOTE — Progress Notes (Signed)
Tele-Health Visit     Evaluation Performed:  Follow-up visit  This visit type was conducted due to national recommendations for restrictions regarding the COVID-19 Pandemic (e.g. social distancing).  This format is felt to be most appropriate for this patient at this time.  All issues noted in this document were discussed and addressed.  No physical exam was performed (except for noted visual exam findings with Video Visits).  Please refer to the patient's chart (MyChart message for video visits and phone note for telephone visits) for the patient's consent to telehealth for [facility].  Date:  01/04/2019   ID:  Nicole Baldwin, DOB [birth year], MRN [id number]  Patient Location:  [place] [address]   Provider location:   [place]  PCP:  Lowella Dandy, NP  Cardiologist:  No primary care provider on file. Rinaldo Cloud MD Electrophysiologist:  None   Chief Complaint:  Heart failure  History of Present Illness:    Nicole Baldwin is a 78 y.o. female who presents via audio/video conferencing for a telehealth visit today.    The patient does not symptoms concerning for COVID-19 infection (fever, chills, cough, or new SHORTNESS OF BREATH).   She has a history of hypertension, edema, heart failure, left bundle branch block  mild to moderate mitral stenosis and  Mild MR last seen 11/30/18 with continued decompensated heart failue.  Her husband died earlier this month despite that her weight is down 5 to 6 pounds less edema and less short of breath walking outdoors New York Heart Association class II no edema orthopnea chest pain palpitation or syncope.   Prior CV studies:   The following studies were reviewed today:    Past Medical History:  Diagnosis Date  . Abnormal echocardiogram 02/03/2018  . Bilateral edema of lower extremity 02/03/2018  . Depression 02/03/2018  . Dyslipidemia 02/03/2018  . Essential hypertension 02/03/2018  . Fatigue 02/03/2018  .  Hyperglycemia 02/03/2018  . Hyperlipidemia   . Hypothyroidism 02/03/2018  . Trigeminal neuralgia 02/03/2018   Past Surgical History:  Procedure Laterality Date  . CARDIAC CATHETERIZATION    . DILATION AND CURETTAGE OF UTERUS    . TUBAL LIGATION       Current Meds  Medication Sig  . aspirin EC 81 MG tablet Take 81 mg by mouth daily.  . Calcium Carb-Cholecalciferol (CALCIUM 600+D3) 600-200 MG-UNIT TABS Take 1 tablet by mouth 2 (two) times daily.   . carvedilol (COREG) 25 MG tablet Take 25 mg by mouth 2 (two) times daily with a meal.  . levothyroxine (SYNTHROID, LEVOTHROID) 50 MCG tablet Take 50 mcg by mouth daily before breakfast.  . lisinopril (PRINIVIL,ZESTRIL) 40 MG tablet Take 40 mg by mouth daily.  . Multiple Vitamin (MULTI VITAMIN DAILY PO) Take 1 tablet by mouth daily.  . Omega-3 Fatty Acids (FISH OIL) 1200 MG CAPS Take 1 capsule by mouth daily.  . simvastatin (ZOCOR) 20 MG tablet Take 20 mg by mouth daily.  Marland Kitchen torsemide (DEMADEX) 20 MG tablet Take 1 tablet (20 mg) three times daily     Allergies:   Patient has no known allergies.   Social History   Tobacco Use  . Smoking status: Never Smoker  . Smokeless tobacco: Never Used  Substance Use Topics  . Alcohol use: Not Currently  . Drug use: Not Currently     Family Hx: The patient's family history includes Breast cancer in her mother; Colon cancer in her son; Diabetes in her  brother; Hypertension in her father; Stomach cancer in her son; Testicular cancer in her son.  ROS:   Please see the history of present illness.    2 All other systems reviewed and are negative.   Labs/Other Tests and Data Reviewed:    Recent Labs: 10/07/2018: BUN 12; Creatinine, Ser 0.89; NT-Pro BNP 166; Potassium 4.0; Sodium 139   Recent Lipid Panel No results found for: CHOL, TRIG, HDL, CHOLHDL, LDLCALC, LDLDIRECT  Wt Readings from Last 3 Encounters:  01/04/19 217 lb 3.2 oz (98.5 kg)  11/30/18 222 lb 12.8 oz (101.1 kg)  10/12/18 221 lb  1.9 oz (100.3 kg)     Exam:    Vital Signs:  BP (!) 160/91 Comment: This was checked with wrist BP cuff.  Pulse 62   Ht 5\' 2"  (1.575 m)   Wt 217 lb 3.2 oz (98.5 kg)   BMI 39.73 kg/m     ASSESSMENT & PLAN:    1.  Congestive heart failure diastolic improved continue current diuretic at next visit we will need to check renal function and proBNP 2.  Hypertension stable continue current treatment including beta-blocker ACE inhibitor Stable recent echocardiogram I would not advise mitral valve surgery or balloon dilatation Stable hyperlipidemia continue statin  COVID-19 Education: The signs and symptoms of COVID-19 were discussed with the patient and how to seek care for testing (follow up with PCP or arrange E-visit).  The importance of social distancing was discussed today.  Patient Risk:   After full review of this patients clinical status, I feel that they are at least moderate risk at this time.  Time:   Today, I have spent 20 minutes with the patient with telehealth technology discussing heart failure response to better compliance with sodium restriction increase diuretic and ongoing treatment with her current diuretic.  I have asked her to follow-up in the office in person in 3 months we will recheck her labs at that time including renal function and proBNP.  In particular I do not think she requires mitral valve surgery  Unfortunately her husband died earlier this month and despite that her heart failure is been stable.  She is short of breath when she goes outdoors to the mailbox but not indoors not with usual activities and no orthopnea or PND she has been restricting salt and tells me that her peripheral edema is improved.  No orthopnea PND palpitations syncope or TIA.  Recent labs were reviewed from December.   Medication Adjustments/Labs and Tests Ordered: Current medicines are reviewed at length with the patient today.  Concerns regarding medicines are outlined above.  Tests  Ordered: No orders of the defined types were placed in this encounter.  Medication Changes: No orders of the defined types were placed in this encounter.   Disposition:  3 months  Signed, Shirlee More, MD  01/04/2019 3:22 PM    [facility]

## 2019-01-12 DIAGNOSIS — Z1231 Encounter for screening mammogram for malignant neoplasm of breast: Secondary | ICD-10-CM | POA: Diagnosis not present

## 2019-01-12 DIAGNOSIS — Z136 Encounter for screening for cardiovascular disorders: Secondary | ICD-10-CM | POA: Diagnosis not present

## 2019-01-12 DIAGNOSIS — E669 Obesity, unspecified: Secondary | ICD-10-CM | POA: Diagnosis not present

## 2019-01-12 DIAGNOSIS — Z0000 Encounter for general adult medical examination without abnormal findings: Secondary | ICD-10-CM | POA: Diagnosis not present

## 2019-01-12 DIAGNOSIS — E785 Hyperlipidemia, unspecified: Secondary | ICD-10-CM | POA: Diagnosis not present

## 2019-01-12 DIAGNOSIS — Z1331 Encounter for screening for depression: Secondary | ICD-10-CM | POA: Diagnosis not present

## 2019-01-12 DIAGNOSIS — Z9181 History of falling: Secondary | ICD-10-CM | POA: Diagnosis not present

## 2019-01-12 DIAGNOSIS — Z6841 Body Mass Index (BMI) 40.0 and over, adult: Secondary | ICD-10-CM | POA: Diagnosis not present

## 2019-02-03 ENCOUNTER — Telehealth: Payer: Self-pay | Admitting: Cardiology

## 2019-02-03 MED ORDER — TORSEMIDE 20 MG PO TABS: ORAL_TABLET | ORAL | 1 refills | Status: DC

## 2019-02-03 NOTE — Telephone Encounter (Signed)
°*  STAT* If patient is at the pharmacy, call can be transferred to refill team.   1. Which medications need to be refilled? (please list name of each medication and dose if known) torsemide (DEMADEX) 20 MG tablet    2. Which pharmacy/location (including street and city if local pharmacy) is medication to be sent to?  [facility] [address], [place] - [place] [phone] (Phone) [phone] (Fax)    3. Do they need a 30 day or 90 day supply? 90 day

## 2019-02-10 DIAGNOSIS — E039 Hypothyroidism, unspecified: Secondary | ICD-10-CM | POA: Diagnosis not present

## 2019-02-10 DIAGNOSIS — R739 Hyperglycemia, unspecified: Secondary | ICD-10-CM | POA: Diagnosis not present

## 2019-02-10 DIAGNOSIS — N289 Disorder of kidney and ureter, unspecified: Secondary | ICD-10-CM | POA: Diagnosis not present

## 2019-02-10 DIAGNOSIS — E785 Hyperlipidemia, unspecified: Secondary | ICD-10-CM | POA: Diagnosis not present

## 2019-02-10 DIAGNOSIS — I5032 Chronic diastolic (congestive) heart failure: Secondary | ICD-10-CM | POA: Diagnosis not present

## 2019-02-10 DIAGNOSIS — Z6841 Body Mass Index (BMI) 40.0 and over, adult: Secondary | ICD-10-CM | POA: Diagnosis not present

## 2019-02-10 DIAGNOSIS — Z139 Encounter for screening, unspecified: Secondary | ICD-10-CM | POA: Diagnosis not present

## 2019-02-10 DIAGNOSIS — I10 Essential (primary) hypertension: Secondary | ICD-10-CM | POA: Diagnosis not present

## 2019-02-22 DIAGNOSIS — K760 Fatty (change of) liver, not elsewhere classified: Secondary | ICD-10-CM | POA: Diagnosis not present

## 2019-02-22 DIAGNOSIS — I700 Atherosclerosis of aorta: Secondary | ICD-10-CM | POA: Diagnosis not present

## 2019-02-22 DIAGNOSIS — N281 Cyst of kidney, acquired: Secondary | ICD-10-CM | POA: Diagnosis not present

## 2019-02-22 DIAGNOSIS — N289 Disorder of kidney and ureter, unspecified: Secondary | ICD-10-CM | POA: Diagnosis not present

## 2019-03-02 ENCOUNTER — Telehealth: Payer: Self-pay | Admitting: *Deleted

## 2019-03-02 MED ORDER — TORSEMIDE 20 MG PO TABS: ORAL_TABLET | ORAL | 0 refills | Status: DC

## 2019-03-02 NOTE — Telephone Encounter (Signed)
Rx refill sent to pharmacy.  Will send a new rx to [facility] tomorrow for 270 so pt can get established with them.

## 2019-03-02 NOTE — Telephone Encounter (Signed)
*  STAT* If patient is at the pharmacy, call can be transferred to refill team.   1. Which medications need to be refilled? (please list name of each medication and dose if known) Torsemide 20mg   2. Which pharmacy/location (including street and city if local pharmacy) is medication to be sent to?  3. Do they need a 30 day or 90 day supply? [facility] [place]

## 2019-03-03 MED ORDER — TORSEMIDE 20 MG PO TABS: ORAL_TABLET | ORAL | 1 refills | Status: DC

## 2019-03-03 NOTE — Telephone Encounter (Signed)
Sending in Pt's Torsemide to [facility] to get her set up with them. 30 day supply sent to [facility] in the mean time.

## 2019-03-03 NOTE — Addendum Note (Signed)
Addended by: Anselm Pancoast on: 03/03/2019 08:24 AM   Modules accepted: Orders

## 2019-03-15 DIAGNOSIS — L821 Other seborrheic keratosis: Secondary | ICD-10-CM | POA: Diagnosis not present

## 2019-03-15 DIAGNOSIS — L820 Inflamed seborrheic keratosis: Secondary | ICD-10-CM | POA: Diagnosis not present

## 2019-04-05 NOTE — Progress Notes (Signed)
Office visit  Date:  04/06/2019   ID:  Nicole Baldwin, DOB 1941/02/21, MRN [id number]  PCP:  Lowella Dandy, NP  Cardiologist:  Shirlee More, MD  Electrophysiologist:  None   Evaluation Performed:  Follow-Up Visit   Chief Complaint:  Heart failure  History of Present Illness:    Nicole Baldwin is a 77 y.o. female with  hypertension, edema, heart failure, left bundle branch block  mild to moderate mitral stenosis and mild MR last seen 01/04/2019.  Sadly I was unaware that she is recently widowed her husband died of sudden cardiac death as the first manifestation of heart disease she is traumatized.  Despite this her weight is stable she has no edema shortness of breath is improved but she still breathless when she walks outdoors stairs or an incline.  No orthopnea chest pain palpitation or syncope and recent labs done in her PCP office are reassuring. Cholesterol 02/10/1999 2150 HDL 48 LDL of 80 A1c 5.7% Hemoglobin 12.9 Creatinine 0.93 potassium 4.0  The patient does not have symptoms concerning for COVID-19 infection (fever, chills, cough, or new shortness of breath).    Past Medical History:  Diagnosis Date  . Abnormal echocardiogram 02/03/2018  . Bilateral edema of lower extremity 02/03/2018  . Depression 02/03/2018  . Dyslipidemia 02/03/2018  . Essential hypertension 02/03/2018  . Fatigue 02/03/2018  . Hyperglycemia 02/03/2018  . Hyperlipidemia   . Hypothyroidism 02/03/2018  . Trigeminal neuralgia 02/03/2018   Past Surgical History:  Procedure Laterality Date  . CARDIAC CATHETERIZATION    . DILATION AND CURETTAGE OF UTERUS    . TUBAL LIGATION       Current Meds  Medication Sig  . aspirin EC 81 MG tablet Take 81 mg by mouth daily.  . Calcium Carb-Cholecalciferol (CALCIUM 600+D3) 600-200 MG-UNIT TABS Take 1 tablet by mouth 2 (two) times daily.   . carvedilol (COREG) 25 MG tablet Take 25 mg by mouth 2 (two) times daily with a meal.  . levothyroxine (SYNTHROID, LEVOTHROID) 50  MCG tablet Take 50 mcg by mouth daily before breakfast.  . lisinopril (PRINIVIL,ZESTRIL) 40 MG tablet Take 40 mg by mouth daily.  . Multiple Vitamin (MULTI VITAMIN DAILY PO) Take 1 tablet by mouth daily.  . Omega-3 Fatty Acids (FISH OIL) 1200 MG CAPS Take 1 capsule by mouth daily.  . simvastatin (ZOCOR) 20 MG tablet Take 20 mg by mouth daily.  Marland Kitchen torsemide (DEMADEX) 20 MG tablet Take 1 tablet (20 mg) three times daily     Allergies:   Patient has no known allergies.   Social History   Tobacco Use  . Smoking status: Never Smoker  . Smokeless tobacco: Never Used  Substance Use Topics  . Alcohol use: Not Currently  . Drug use: Not Currently     Family Hx: The patient's family history includes Breast cancer in her mother; Colon cancer in her son; Diabetes in her brother; Hypertension in her father; Stomach cancer in her son; Testicular cancer in her son.  ROS:   Please see the history of present illness.     All other systems reviewed and are negative.   Prior CV studies:   The following studies were reviewed today: EKG Feb 21, 2018 sinus rhythm left bundle branch block QRS duration 146 ms Echo 10/04/18:   Study Conclusions  - Left ventricle: The cavity size was normal. Wall thickness was   increased in a pattern of severe LVH. Systolic function was   normal. The estimated ejection fraction was  in the range of 50%   to 55%. Wall motion was normal; there were no regional wall   motion abnormalities. Doppler parameters are consistent with   abnormal left ventricular relaxation (grade 1 diastolic   dysfunction). Doppler parameters are consistent with high   ventricular filling pressure. - Aortic valve: There was mild regurgitation. Valve area (Vmax):   2.12 cm^2. 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 - Mitral valve: Moderately calcified annulus. Severely thickened,   moderately calcified leaflets . The commisures are not fused, non   rheumatic etiology.  The findings are consistent with mild to   moderate stenosis. There was mild regurgitation. Valve area by   pressure half-time: 1.93 cm^2. Valve area by continuity equation   (using LVOT flow): 1.5 cm^2. - Pulmonary arteries: PA peak pressure: 32 mm Hg (S).  Labs/Other Tests and Data Reviewed:      Recent Labs: Dictated under history  Wt Readings from Last 3 Encounters:  04/06/19 217 lb 6.4 oz (98.6 kg)  01/04/19 217 lb 3.2 oz (98.5 kg)  11/30/18 222 lb 12.8 oz (101.1 kg)     Objective:    Vital Signs:  BP 134/76 (BP Location: Right Arm, Patient Position: Sitting, Cuff Size: Large)   Pulse 65   Temp (!) 97.2 F (36.2 C)   Ht 5\' 2"  (1.575 m)   Wt 217 lb 6.4 oz (98.6 kg)   SpO2 95%   BMI 39.76 kg/m    VITAL SIGNS:  reviewed GEN:  no acute distress EYES:  sclerae anicteric, EOMI - Extraocular Movements Intact RESPIRATORY:  normal respiratory effort, symmetric expansion CARDIOVASCULAR:  no peripheral edema SKIN:  no rash, lesions or ulcers. MUSCULOSKELETAL:  no obvious deformities. NEURO:  alert and oriented x 3, no obvious focal deficit PSYCH:  normal affect She has no neck vein distention or edema chest is hyperinflated clear no rales heart is regular S2 paradoxical grade 1/6 to 2/6 murmur of MR at the apex I could not auscultate mitral stenosis ASSESSMENT & PLAN:    1. Chronic diastolic heart failure improved New York Heart Association class II she has no evidence of fluid overload and will continue her current diuretic.  I reinforced the need for home self management and strict sodium restriction. 2. Hypertensive heart disease with heart failure stable BP at target continue current antihypertensives with loop diuretic beta-blocker ACE inhibitor 3. Left bundle branch block seen on EKG 2019 we will reevaluate for asynchronous contractility ejection fraction with echocardiogram in December and if EF is reduced with heart failure asynchrony would benefit from CRT 4. Mitral  stenosis with heart failure recheck echocardiogram 5. Mitral regurgitation with heart failure recheck echocardiogram  COVID-19 Education: The signs and symptoms of COVID-19 were discussed with the patient and how to seek care for testing (follow up with PCP or arrange E-visit).  The importance of social distancing was discussed today.  Time:   Today, I have spent 25 minutes with the patient with telehealth technology discussing the above problems.     Medication Adjustments/Labs and Tests Ordered: Current medicines are reviewed at length with the patient today.  Concerns regarding medicines are outlined above.   Tests Ordered: Orders Placed This Encounter  Procedures  . ECHOCARDIOGRAM COMPLETE    Medication Changes: No orders of the defined types were placed in this encounter.   Follow Up:  In Person in 6 month(s)  Signed, Shirlee More, MD  04/06/2019 11:54 AM    [facility]

## 2019-04-06 ENCOUNTER — Encounter: Payer: Self-pay | Admitting: Cardiology

## 2019-04-06 ENCOUNTER — Ambulatory Visit (INDEPENDENT_AMBULATORY_CARE_PROVIDER_SITE_OTHER): Payer: Medicare HMO | Admitting: Cardiology

## 2019-04-06 ENCOUNTER — Other Ambulatory Visit: Payer: Self-pay

## 2019-04-06 VITALS — BP 134/76 | HR 65 | Temp 97.2°F | Ht 62.0 in | Wt 217.4 lb

## 2019-04-06 DIAGNOSIS — I340 Nonrheumatic mitral (valve) insufficiency: Secondary | ICD-10-CM

## 2019-04-06 DIAGNOSIS — I110 Hypertensive heart disease with heart failure: Secondary | ICD-10-CM | POA: Diagnosis not present

## 2019-04-06 DIAGNOSIS — I5032 Chronic diastolic (congestive) heart failure: Secondary | ICD-10-CM | POA: Diagnosis not present

## 2019-04-06 DIAGNOSIS — I342 Nonrheumatic mitral (valve) stenosis: Secondary | ICD-10-CM

## 2019-04-06 DIAGNOSIS — I447 Left bundle-branch block, unspecified: Secondary | ICD-10-CM

## 2019-04-06 HISTORY — DX: Nonrheumatic mitral (valve) insufficiency: I34.0

## 2019-04-06 HISTORY — DX: Left bundle-branch block, unspecified: I44.7

## 2019-04-06 NOTE — Patient Instructions (Signed)
Medication Instructions:  Your physician recommends that you continue on your current medications as directed. Please refer to the Current Medication list given to you today.  If you need a refill on your cardiac medications before your next appointment, please call your pharmacy.   Lab work: None If you have labs (blood work) drawn today and your tests are completely normal, you will receive your results only by: Marland Kitchen MyChart Message (if you have MyChart) OR . A paper copy in the mail If you have any lab test that is abnormal or we need to change your treatment, we will call you to review the results.  Testing/Procedures: Your physician has requested that you have an echocardiogram. Echocardiography is a painless test that uses sound waves to create images of your heart. It provides your doctor with information about the size and shape of your heart and how well your heart's chambers and valves are working. This procedure takes approximately one hour. There are no restrictions for this procedure.    Follow-Up: At [facility], you and your health needs are our priority.  As part of our continuing mission to provide you with exceptional heart care, we have created designated Provider Care Teams.  These Care Teams include your primary Cardiologist (physician) and Advanced Practice Providers (APPs -  Physician Assistants and Nurse Practitioners) who all work together to provide you with the care you need, when you need it. You will need a follow up appointment in 6 months.   Any Other Special Instructions Will Be Listed Below (If Applicable).

## 2019-04-11 ENCOUNTER — Ambulatory Visit: Payer: Medicare HMO | Admitting: Cardiology

## 2019-05-17 DIAGNOSIS — S60512A Abrasion of left hand, initial encounter: Secondary | ICD-10-CM | POA: Diagnosis not present

## 2019-05-17 DIAGNOSIS — Z6841 Body Mass Index (BMI) 40.0 and over, adult: Secondary | ICD-10-CM | POA: Diagnosis not present

## 2019-05-17 DIAGNOSIS — I10 Essential (primary) hypertension: Secondary | ICD-10-CM | POA: Diagnosis not present

## 2019-05-17 DIAGNOSIS — I5032 Chronic diastolic (congestive) heart failure: Secondary | ICD-10-CM | POA: Diagnosis not present

## 2019-05-17 DIAGNOSIS — E785 Hyperlipidemia, unspecified: Secondary | ICD-10-CM | POA: Diagnosis not present

## 2019-05-17 DIAGNOSIS — E039 Hypothyroidism, unspecified: Secondary | ICD-10-CM | POA: Diagnosis not present

## 2019-05-17 DIAGNOSIS — W5503XA Scratched by cat, initial encounter: Secondary | ICD-10-CM | POA: Diagnosis not present

## 2019-05-17 DIAGNOSIS — R739 Hyperglycemia, unspecified: Secondary | ICD-10-CM | POA: Diagnosis not present

## 2019-05-27 DIAGNOSIS — R928 Other abnormal and inconclusive findings on diagnostic imaging of breast: Secondary | ICD-10-CM | POA: Diagnosis not present

## 2019-05-27 DIAGNOSIS — Z1231 Encounter for screening mammogram for malignant neoplasm of breast: Secondary | ICD-10-CM | POA: Diagnosis not present

## 2019-05-31 DIAGNOSIS — L578 Other skin changes due to chronic exposure to nonionizing radiation: Secondary | ICD-10-CM | POA: Diagnosis not present

## 2019-05-31 DIAGNOSIS — C44319 Basal cell carcinoma of skin of other parts of face: Secondary | ICD-10-CM | POA: Diagnosis not present

## 2019-05-31 DIAGNOSIS — L821 Other seborrheic keratosis: Secondary | ICD-10-CM | POA: Diagnosis not present

## 2019-05-31 DIAGNOSIS — L820 Inflamed seborrheic keratosis: Secondary | ICD-10-CM | POA: Diagnosis not present

## 2019-05-31 DIAGNOSIS — C44311 Basal cell carcinoma of skin of nose: Secondary | ICD-10-CM | POA: Diagnosis not present

## 2019-06-16 DIAGNOSIS — R928 Other abnormal and inconclusive findings on diagnostic imaging of breast: Secondary | ICD-10-CM | POA: Diagnosis not present

## 2019-06-16 DIAGNOSIS — N6312 Unspecified lump in the right breast, upper inner quadrant: Secondary | ICD-10-CM | POA: Diagnosis not present

## 2019-06-27 DIAGNOSIS — R928 Other abnormal and inconclusive findings on diagnostic imaging of breast: Secondary | ICD-10-CM | POA: Diagnosis not present

## 2019-06-27 DIAGNOSIS — N6312 Unspecified lump in the right breast, upper inner quadrant: Secondary | ICD-10-CM | POA: Diagnosis not present

## 2019-06-27 DIAGNOSIS — D0591 Unspecified type of carcinoma in situ of right breast: Secondary | ICD-10-CM | POA: Diagnosis not present

## 2019-07-07 DIAGNOSIS — D0581 Other specified type of carcinoma in situ of right breast: Secondary | ICD-10-CM

## 2019-07-07 HISTORY — DX: Other specified type of carcinoma in situ of right breast: D05.81

## 2019-07-14 HISTORY — PX: BREAST LUMPECTOMY: SHX2

## 2019-08-04 ENCOUNTER — Telehealth: Payer: Self-pay | Admitting: Cardiology

## 2019-08-04 MED ORDER — TORSEMIDE 20 MG PO TABS: ORAL_TABLET | ORAL | 0 refills | Status: DC

## 2019-08-04 NOTE — Telephone Encounter (Signed)
Rx for torsemide 20mg  one tablet three times daily sent to [facility] order as requested.

## 2019-08-04 NOTE — Telephone Encounter (Signed)
°*  STAT* If patient is at the pharmacy, call can be transferred to refill team.   1. Which medications need to be refilled? (please list name of each medication and dose if known) torsemide 20mg   2. Which pharmacy/location (including street and city if local pharmacy) is medication to be sent [place] mail pharmacy  3. Do they need a 30 day or 90 day supply?[address]

## 2019-08-12 DIAGNOSIS — E039 Hypothyroidism, unspecified: Secondary | ICD-10-CM | POA: Diagnosis not present

## 2019-08-12 DIAGNOSIS — D0511 Intraductal carcinoma in situ of right breast: Secondary | ICD-10-CM | POA: Diagnosis not present

## 2019-08-12 DIAGNOSIS — I509 Heart failure, unspecified: Secondary | ICD-10-CM | POA: Diagnosis not present

## 2019-08-12 DIAGNOSIS — Z7982 Long term (current) use of aspirin: Secondary | ICD-10-CM | POA: Diagnosis not present

## 2019-08-12 DIAGNOSIS — C50911 Malignant neoplasm of unspecified site of right female breast: Secondary | ICD-10-CM | POA: Diagnosis not present

## 2019-08-12 DIAGNOSIS — Z79899 Other long term (current) drug therapy: Secondary | ICD-10-CM | POA: Diagnosis not present

## 2019-08-12 DIAGNOSIS — D0581 Other specified type of carcinoma in situ of right breast: Secondary | ICD-10-CM | POA: Diagnosis not present

## 2019-08-12 DIAGNOSIS — E669 Obesity, unspecified: Secondary | ICD-10-CM | POA: Diagnosis not present

## 2019-08-12 DIAGNOSIS — N6011 Diffuse cystic mastopathy of right breast: Secondary | ICD-10-CM | POA: Diagnosis not present

## 2019-08-24 DIAGNOSIS — D0511 Intraductal carcinoma in situ of right breast: Secondary | ICD-10-CM | POA: Insufficient documentation

## 2019-08-24 HISTORY — DX: Intraductal carcinoma in situ of right breast: D05.11

## 2019-08-30 DIAGNOSIS — E039 Hypothyroidism, unspecified: Secondary | ICD-10-CM | POA: Diagnosis not present

## 2019-08-30 DIAGNOSIS — I5032 Chronic diastolic (congestive) heart failure: Secondary | ICD-10-CM | POA: Diagnosis not present

## 2019-08-30 DIAGNOSIS — I10 Essential (primary) hypertension: Secondary | ICD-10-CM | POA: Diagnosis not present

## 2019-08-30 DIAGNOSIS — Z6841 Body Mass Index (BMI) 40.0 and over, adult: Secondary | ICD-10-CM | POA: Diagnosis not present

## 2019-08-30 DIAGNOSIS — Z2821 Immunization not carried out because of patient refusal: Secondary | ICD-10-CM | POA: Diagnosis not present

## 2019-08-30 DIAGNOSIS — R739 Hyperglycemia, unspecified: Secondary | ICD-10-CM | POA: Diagnosis not present

## 2019-08-30 DIAGNOSIS — E785 Hyperlipidemia, unspecified: Secondary | ICD-10-CM | POA: Diagnosis not present

## 2019-09-06 DIAGNOSIS — Z170 Estrogen receptor positive status [ER+]: Secondary | ICD-10-CM | POA: Diagnosis not present

## 2019-09-06 DIAGNOSIS — D0511 Intraductal carcinoma in situ of right breast: Secondary | ICD-10-CM | POA: Diagnosis not present

## 2019-09-15 ENCOUNTER — Ambulatory Visit (INDEPENDENT_AMBULATORY_CARE_PROVIDER_SITE_OTHER): Payer: Medicare HMO

## 2019-09-15 ENCOUNTER — Other Ambulatory Visit: Payer: Self-pay

## 2019-09-15 DIAGNOSIS — I340 Nonrheumatic mitral (valve) insufficiency: Secondary | ICD-10-CM

## 2019-09-15 DIAGNOSIS — I5032 Chronic diastolic (congestive) heart failure: Secondary | ICD-10-CM

## 2019-09-15 DIAGNOSIS — I110 Hypertensive heart disease with heart failure: Secondary | ICD-10-CM | POA: Diagnosis not present

## 2019-09-15 DIAGNOSIS — I342 Nonrheumatic mitral (valve) stenosis: Secondary | ICD-10-CM

## 2019-09-15 NOTE — Progress Notes (Signed)
Complete echocardiogram has been performed.  Jimmy Aaliyah Cancro RDCS, RVT 

## 2019-09-19 NOTE — Progress Notes (Signed)
Cardiology Office Note:    Date:  09/20/2019   ID:  Nicole Baldwin, DOB Jun 18, 1941, MRN [id number]  PCP:  Lowella Dandy, NP  Cardiologist:  Shirlee More, MD    Referring MD: Lowella Dandy, NP    ASSESSMENT:    1. Hypertensive heart disease with heart failure ([place])   2. Chronic diastolic heart failure ([place])   3. Left bundle branch block   4. Nonrheumatic mitral valve regurgitation   5. LBBB (left bundle branch block)   6. Aortic stenosis, mild    PLAN:    In order of problems listed above:  1. Stable her mean blood pressure is normal and continue current treatment including beta-blocker ARB 2. Stable compensated continue her current loop diuretic 3. Stable no evidence of cardiomyopathy 4. Stable mitral valve disease mitral stenosis is mild and nonrheumatic mitral regurgitation remains mild 5. Stable mild aortic stenosis 6. Hyperlipidemia stable lipids at target.   Next appointment: 9 months after the peak of Covid   Medication Adjustments/Labs and Tests Ordered: Current medicines are reviewed at length with the patient today.  Concerns regarding medicines are outlined above.  No orders of the defined types were placed in this encounter.  No orders of the defined types were placed in this encounter.   Chief Complaint  Patient presents with   Follow-up    aftyer echo for heart failure, LBBB and MV disease    History of Present Illness:    Nicole Baldwin is a 78 y.o. female with a hx of  hypertension, edema, heart failure, left bundle branch block  mild to moderate mitral stenosis and mild MR  last seen 04/06/2019. Compliance with diet, lifestyle and medications: Yes  Reviewed the echo with her below and told her she does not have severe mitral valve disease and I doubt she will ever require intervention.  Echo 09/15/2019:  1. Left ventricular ejection fraction, by visual estimation, is 55 to 60%. The left ventricle has normal function. Left ventricular septal wall  thickness was mildly increased. Mildly increased left ventricular posterior wall thickness. There is mildly increased left ventricular hypertrophy.  2. Elevated left ventricular end-diastolic pressure.  3. Left ventricular diastolic parameters are consistent with Grade I diastolic dysfunction (impaired relaxation).  4. The left ventricle has no regional wall motion abnormalities.  5. Global right ventricle has normal systolic function.The right ventricular size is normal. No increase in right ventricular wall thickness.  6. Left atrial size was normal.  7. Right atrial size was normal.  8. Mild mitral annular calcification.  9. The mitral valve is degenerative. Mild mitral valve regurgitation. There is mild mitral stenosis. 10. The tricuspid valve is normal in structure. Tricuspid valve regurgitation is mild. 11. The aortic valve is tricuspid. Aortic valve regurgitation is not visualized. Mild aortic valve stenosis. 12. The pulmonic valve was normal in structure. Pulmonic valve regurgitation is not visualized. 13. 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 14. Normal pulmonary artery systolic pressure. 15. The inferior vena cava is normal in size with greater than 50% respiratory variability, suggesting right atrial pressure of 3 mmHg.  She has improved no edema shortness of breath orthopnea chest pain palpitation or syncope.  Recent labs at her PCP office 08/30/2019 cholesterol 172 LDL 87 HDL 49 A1c 5.6% creatinine normal.  Patient has the same pattern left bundle branch block Past Medical History:  Diagnosis Date   Abnormal echocardiogram 02/03/2018   Bilateral edema of lower extremity  02/03/2018   Depression 02/03/2018   Dyslipidemia 02/03/2018   Essential hypertension 02/03/2018   Fatigue 02/03/2018   Hyperglycemia 02/03/2018   Hyperlipidemia    Hypothyroidism 02/03/2018   Trigeminal neuralgia 02/03/2018    Past Surgical History:  Procedure Laterality  Date   BREAST LUMPECTOMY Right 07/2019   CARDIAC CATHETERIZATION     DILATION AND CURETTAGE OF UTERUS     TUBAL LIGATION      Current Medications: Current Meds  Medication Sig   aspirin EC 81 MG tablet Take 81 mg by mouth daily.   Calcium Carb-Cholecalciferol (CALCIUM 600+D3) 600-200 MG-UNIT TABS Take 1 tablet by mouth 2 (two) times daily.    carvedilol (COREG) 25 MG tablet Take 25 mg by mouth 2 (two) times daily with a meal.   levothyroxine (SYNTHROID, LEVOTHROID) 50 MCG tablet Take 50 mcg by mouth daily before breakfast.   lisinopril (PRINIVIL,ZESTRIL) 40 MG tablet Take 40 mg by mouth daily.   Omega-3 Fatty Acids (FISH OIL) 1200 MG CAPS Take 1 capsule by mouth daily.   raloxifene (EVISTA) 60 MG tablet Take 1 tablet by mouth daily.   simvastatin (ZOCOR) 20 MG tablet Take 20 mg by mouth daily.   torsemide (DEMADEX) 20 MG tablet Take 1 tablet (20 mg) three times daily     Allergies:   Patient has no known allergies.   Social History   Socioeconomic History   Marital status: Unknown    Spouse name: Not on file   Number of children: Not on file   Years of education: Not on file   Highest education level: Not on file  Occupational History   Not on file  Social Needs   Financial resource strain: Not on file   Food insecurity    Worry: Not on file    Inability: Not on file   Transportation needs    Medical: Not on file    Non-medical: Not on file  Tobacco Use   Smoking status: Never Smoker   Smokeless tobacco: Never Used  Substance and Sexual Activity   Alcohol use: Not Currently   Drug use: Not Currently   Sexual activity: Not on file  Lifestyle   Physical activity    Days per week: Not on file    Minutes per session: Not on file   Stress: Not on file  Relationships   Social connections    Talks on phone: Not on file    Gets together: Not on file    Attends religious service: Not on file    Active member of club or organization: Not on  file    Attends meetings of clubs or organizations: Not on file    Relationship status: Not on file  Other Topics Concern   Not on file  Social History Narrative   Not on file     Family History: The patient's family history includes Breast cancer in her mother; Colon cancer in her son; Diabetes in her brother; Hypertension in her father; Stomach cancer in her son; Testicular cancer in her son. ROS:   Please see the history of present illness.    All other systems reviewed and are negative.  EKGs/Labs/Other Studies Reviewed:    The following studies were reviewed today:  EKG:  EKG ordered today and personally reviewed.  The ekg ordered today demonstrates sinus rhythm left bundle branch block  Recent Labs: 10/07/2018: BUN 12; Creatinine, Ser 0.89; NT-Pro BNP 166; Potassium 4.0; Sodium 139  Recent Lipid Panel No results found  for: CHOL, TRIG, HDL, CHOLHDL, VLDL, LDLCALC, LDLDIRECT  Physical Exam:    VS:  BP (!) 146/78 (BP Location: Right Arm, Patient Position: Sitting, Cuff Size: Normal)    Pulse 60    Ht 5\' 2"  (1.575 m)    Wt 219 lb 3.2 oz (99.4 kg)    SpO2 92%    BMI 40.09 kg/m     Wt Readings from Last 3 Encounters:  09/20/19 219 lb 3.2 oz (99.4 kg)  04/06/19 217 lb 6.4 oz (98.6 kg)  01/04/19 217 lb 3.2 oz (98.5 kg)     GEN:  Well nourished, well developed in no acute distress HEENT: Normal NECK: No JVD; No carotid bruits LYMPHATICS: No lymphadenopathy CARDIAC: RRR, no murmurs, rubs, gallops RESPIRATORY:  Clear to auscultation without rales, wheezing or rhonchi  ABDOMEN: Soft, non-tender, non-distended MUSCULOSKELETAL:  No edema; No deformity  SKIN: Warm and dry NEUROLOGIC:  Alert and oriented x 3 PSYCHIATRIC:  Normal affect    Signed, Shirlee More, MD  09/20/2019 1:54 PM    [facility]

## 2019-09-20 ENCOUNTER — Other Ambulatory Visit: Payer: Self-pay

## 2019-09-20 ENCOUNTER — Encounter: Payer: Self-pay | Admitting: Cardiology

## 2019-09-20 ENCOUNTER — Ambulatory Visit (INDEPENDENT_AMBULATORY_CARE_PROVIDER_SITE_OTHER): Payer: Medicare HMO | Admitting: Cardiology

## 2019-09-20 VITALS — BP 146/78 | HR 60 | Ht 62.0 in | Wt 219.2 lb

## 2019-09-20 DIAGNOSIS — I350 Nonrheumatic aortic (valve) stenosis: Secondary | ICD-10-CM | POA: Diagnosis not present

## 2019-09-20 DIAGNOSIS — I5032 Chronic diastolic (congestive) heart failure: Secondary | ICD-10-CM

## 2019-09-20 DIAGNOSIS — I340 Nonrheumatic mitral (valve) insufficiency: Secondary | ICD-10-CM

## 2019-09-20 DIAGNOSIS — I447 Left bundle-branch block, unspecified: Secondary | ICD-10-CM

## 2019-09-20 DIAGNOSIS — I110 Hypertensive heart disease with heart failure: Secondary | ICD-10-CM

## 2019-09-20 NOTE — Patient Instructions (Signed)
Medication Instructions:  Your physician recommends that you continue on your current medications as directed. Please refer to the Current Medication list given to you today.  *If you need a refill on your cardiac medications before your next appointment, please call your pharmacy*  Lab Work: None  If you have labs (blood work) drawn today and your tests are completely normal, you will receive your results only by: Marland Kitchen MyChart Message (if you have MyChart) OR . A paper copy in the mail If you have any lab test that is abnormal or we need to change your treatment, we will call you to review the results.  Testing/Procedures: You had an EKG today.   Follow-Up: At [facility], you and your health needs are our priority.  As part of our continuing mission to provide you with exceptional heart care, we have created designated Provider Care Teams.  These Care Teams include your primary Cardiologist (physician) and Advanced Practice Providers (APPs -  Physician Assistants and Nurse Practitioners) who all work together to provide you with the care you need, when you need it.  Your next appointment:   9 month(s)  The format for your next appointment:   In Person  Provider:   Shirlee More, MD

## 2019-09-21 DIAGNOSIS — Z09 Encounter for follow-up examination after completed treatment for conditions other than malignant neoplasm: Secondary | ICD-10-CM

## 2019-09-21 HISTORY — DX: Encounter for follow-up examination after completed treatment for conditions other than malignant neoplasm: Z09

## 2019-10-19 DIAGNOSIS — C44311 Basal cell carcinoma of skin of nose: Secondary | ICD-10-CM | POA: Diagnosis not present

## 2019-10-24 DIAGNOSIS — R112 Nausea with vomiting, unspecified: Secondary | ICD-10-CM | POA: Diagnosis not present

## 2019-10-24 DIAGNOSIS — Z20822 Contact with and (suspected) exposure to covid-19: Secondary | ICD-10-CM | POA: Diagnosis not present

## 2019-10-24 DIAGNOSIS — R6889 Other general symptoms and signs: Secondary | ICD-10-CM | POA: Diagnosis not present

## 2019-10-27 DIAGNOSIS — R778 Other specified abnormalities of plasma proteins: Secondary | ICD-10-CM | POA: Diagnosis not present

## 2019-10-27 DIAGNOSIS — R069 Unspecified abnormalities of breathing: Secondary | ICD-10-CM | POA: Diagnosis not present

## 2019-10-27 DIAGNOSIS — R0602 Shortness of breath: Secondary | ICD-10-CM | POA: Diagnosis not present

## 2019-10-27 DIAGNOSIS — R05 Cough: Secondary | ICD-10-CM | POA: Diagnosis not present

## 2019-10-27 DIAGNOSIS — J1282 Pneumonia due to coronavirus disease 2019: Secondary | ICD-10-CM | POA: Diagnosis not present

## 2019-10-27 DIAGNOSIS — U071 COVID-19: Secondary | ICD-10-CM | POA: Diagnosis not present

## 2019-10-27 DIAGNOSIS — R0902 Hypoxemia: Secondary | ICD-10-CM | POA: Diagnosis not present

## 2019-10-28 ENCOUNTER — Other Ambulatory Visit: Payer: Self-pay

## 2019-10-28 ENCOUNTER — Inpatient Hospital Stay (HOSPITAL_COMMUNITY): Payer: Medicare HMO

## 2019-10-28 ENCOUNTER — Encounter (HOSPITAL_COMMUNITY): Payer: Self-pay | Admitting: Internal Medicine

## 2019-10-28 ENCOUNTER — Inpatient Hospital Stay (HOSPITAL_COMMUNITY)
Admission: AD | Admit: 2019-10-28 | Discharge: 2019-10-31 | DRG: 177 | Disposition: A | Discharge status: Home / Self Care | Payer: Medicare HMO | Source: Other Acute Inpatient Hospital | Attending: Internal Medicine | Admitting: Internal Medicine

## 2019-10-28 DIAGNOSIS — Z85828 Personal history of other malignant neoplasm of skin: Secondary | ICD-10-CM

## 2019-10-28 DIAGNOSIS — Z79890 Hormone replacement therapy (postmenopausal): Secondary | ICD-10-CM

## 2019-10-28 DIAGNOSIS — U071 COVID-19: Secondary | ICD-10-CM | POA: Diagnosis not present

## 2019-10-28 DIAGNOSIS — Z8249 Family history of ischemic heart disease and other diseases of the circulatory system: Secondary | ICD-10-CM

## 2019-10-28 DIAGNOSIS — Z833 Family history of diabetes mellitus: Secondary | ICD-10-CM

## 2019-10-28 DIAGNOSIS — I110 Hypertensive heart disease with heart failure: Secondary | ICD-10-CM | POA: Diagnosis not present

## 2019-10-28 DIAGNOSIS — R918 Other nonspecific abnormal finding of lung field: Secondary | ICD-10-CM | POA: Diagnosis not present

## 2019-10-28 DIAGNOSIS — R0602 Shortness of breath: Secondary | ICD-10-CM | POA: Diagnosis not present

## 2019-10-28 DIAGNOSIS — N179 Acute kidney failure, unspecified: Secondary | ICD-10-CM | POA: Diagnosis present

## 2019-10-28 DIAGNOSIS — Z7982 Long term (current) use of aspirin: Secondary | ICD-10-CM

## 2019-10-28 DIAGNOSIS — E871 Hypo-osmolality and hyponatremia: Secondary | ICD-10-CM | POA: Diagnosis not present

## 2019-10-28 DIAGNOSIS — J069 Acute upper respiratory infection, unspecified: Secondary | ICD-10-CM

## 2019-10-28 DIAGNOSIS — J9601 Acute respiratory failure with hypoxia: Secondary | ICD-10-CM | POA: Diagnosis present

## 2019-10-28 DIAGNOSIS — E039 Hypothyroidism, unspecified: Secondary | ICD-10-CM | POA: Diagnosis not present

## 2019-10-28 DIAGNOSIS — J80 Acute respiratory distress syndrome: Secondary | ICD-10-CM | POA: Diagnosis not present

## 2019-10-28 DIAGNOSIS — J1282 Pneumonia due to coronavirus disease 2019: Secondary | ICD-10-CM | POA: Diagnosis not present

## 2019-10-28 DIAGNOSIS — R0902 Hypoxemia: Secondary | ICD-10-CM | POA: Diagnosis not present

## 2019-10-28 DIAGNOSIS — Z803 Family history of malignant neoplasm of breast: Secondary | ICD-10-CM | POA: Diagnosis not present

## 2019-10-28 DIAGNOSIS — R778 Other specified abnormalities of plasma proteins: Secondary | ICD-10-CM | POA: Diagnosis not present

## 2019-10-28 DIAGNOSIS — E860 Dehydration: Secondary | ICD-10-CM | POA: Diagnosis present

## 2019-10-28 DIAGNOSIS — I5032 Chronic diastolic (congestive) heart failure: Secondary | ICD-10-CM | POA: Diagnosis not present

## 2019-10-28 DIAGNOSIS — Z800 Family history of malignant neoplasm of digestive organs: Secondary | ICD-10-CM

## 2019-10-28 DIAGNOSIS — R0600 Dyspnea, unspecified: Secondary | ICD-10-CM | POA: Diagnosis not present

## 2019-10-28 DIAGNOSIS — E785 Hyperlipidemia, unspecified: Secondary | ICD-10-CM | POA: Diagnosis not present

## 2019-10-28 DIAGNOSIS — I447 Left bundle-branch block, unspecified: Secondary | ICD-10-CM | POA: Diagnosis not present

## 2019-10-28 HISTORY — DX: COVID-19: U07.1

## 2019-10-28 HISTORY — DX: Pneumonia due to coronavirus disease 2019: J12.82

## 2019-10-28 LAB — FIBRINOGEN: Fibrinogen: 531 mg/dL — ABNORMAL HIGH (ref 210–475)

## 2019-10-28 LAB — CBC WITH DIFFERENTIAL/PLATELET
Abs Immature Granulocytes: 0.07 10*3/uL (ref 0.00–0.07)
Basophils Absolute: 0 10*3/uL (ref 0.0–0.1)
Basophils Relative: 0 %
Eosinophils Absolute: 0 10*3/uL (ref 0.0–0.5)
Eosinophils Relative: 0 %
HCT: 33.9 % — ABNORMAL LOW (ref 36.0–46.0)
Hemoglobin: 11.2 g/dL — ABNORMAL LOW (ref 12.0–15.0)
Immature Granulocytes: 1 %
Lymphocytes Relative: 14 %
Lymphs Abs: 0.8 10*3/uL (ref 0.7–4.0)
MCH: 30.2 pg (ref 26.0–34.0)
MCHC: 33 g/dL (ref 30.0–36.0)
MCV: 91.4 fL (ref 80.0–100.0)
Monocytes Absolute: 0.5 10*3/uL (ref 0.1–1.0)
Monocytes Relative: 9 %
Neutro Abs: 4.3 10*3/uL (ref 1.7–7.7)
Neutrophils Relative %: 76 %
Platelets: 254 10*3/uL (ref 150–400)
RBC: 3.71 MIL/uL — ABNORMAL LOW (ref 3.87–5.11)
RDW: 13 % (ref 11.5–15.5)
WBC: 5.7 10*3/uL (ref 4.0–10.5)
nRBC: 0.7 % — ABNORMAL HIGH (ref 0.0–0.2)

## 2019-10-28 LAB — COMPREHENSIVE METABOLIC PANEL
ALT: 26 U/L (ref 0–44)
AST: 33 U/L (ref 15–41)
Albumin: 2.8 g/dL — ABNORMAL LOW (ref 3.5–5.0)
Alkaline Phosphatase: 39 U/L (ref 38–126)
Anion gap: 10 (ref 5–15)
BUN: 28 mg/dL — ABNORMAL HIGH (ref 8–23)
CO2: 32 mmol/L (ref 22–32)
Calcium: 8.4 mg/dL — ABNORMAL LOW (ref 8.9–10.3)
Chloride: 83 mmol/L — ABNORMAL LOW (ref 98–111)
Creatinine, Ser: 1.4 mg/dL — ABNORMAL HIGH (ref 0.44–1.00)
GFR calc Af Amer: 41 mL/min — ABNORMAL LOW (ref >60)
GFR calc non Af Amer: 36 mL/min — ABNORMAL LOW (ref >60)
Glucose, Bld: 109 mg/dL — ABNORMAL HIGH (ref 70–99)
Potassium: 3.9 mmol/L (ref 3.5–5.1)
Sodium: 125 mmol/L — ABNORMAL LOW (ref 135–145)
Total Bilirubin: 0.7 mg/dL (ref 0.3–1.2)
Total Protein: 6.4 g/dL — ABNORMAL LOW (ref 6.5–8.1)

## 2019-10-28 LAB — FERRITIN: Ferritin: 112 ng/mL (ref 11–307)

## 2019-10-28 LAB — LACTATE DEHYDROGENASE: LDH: 216 U/L — ABNORMAL HIGH (ref 98–192)

## 2019-10-28 LAB — ABO/RH: ABO/RH(D): O POS

## 2019-10-28 LAB — TROPONIN I (HIGH SENSITIVITY)
Troponin I (High Sensitivity): 195 ng/L (ref <18)
Troponin I (High Sensitivity): 162 ng/L (ref <18)

## 2019-10-28 LAB — PROCALCITONIN: Procalcitonin: 0.11 ng/mL

## 2019-10-28 LAB — GLUCOSE, CAPILLARY
Glucose-Capillary: 112 mg/dL — ABNORMAL HIGH (ref 70–99)
Glucose-Capillary: 127 mg/dL — ABNORMAL HIGH (ref 70–99)
Glucose-Capillary: 140 mg/dL — ABNORMAL HIGH (ref 70–99)
Glucose-Capillary: 127 mg/dL — ABNORMAL HIGH (ref 70–99)

## 2019-10-28 MED ORDER — SIMVASTATIN 20 MG PO TABS
20.0000 mg | ORAL_TABLET | Freq: Every day | ORAL | Status: DC
  Administered 2019-10-28 – 2019-10-30 (×3): 20 mg via ORAL
  Filled 2019-10-28 (×3): qty 1

## 2019-10-28 MED ORDER — SODIUM CHLORIDE 0.9 % IV SOLN
100.0000 mg | Freq: Every day | INTRAVENOUS | Status: AC
  Administered 2019-10-28 – 2019-10-31 (×4): 100 mg via INTRAVENOUS
  Filled 2019-10-28 (×4): qty 20

## 2019-10-28 MED ORDER — ASPIRIN EC 81 MG PO TBEC
81.0000 mg | DELAYED_RELEASE_TABLET | Freq: Every day | ORAL | Status: DC
  Administered 2019-10-28 – 2019-10-31 (×4): 81 mg via ORAL
  Filled 2019-10-28 (×4): qty 1

## 2019-10-28 MED ORDER — HYDROCOD POLST-CPM POLST ER 10-8 MG/5ML PO SUER: 5.0000 mL | Freq: Two times a day (BID) | ORAL | Status: DC | PRN

## 2019-10-28 MED ORDER — GUAIFENESIN-DM 100-10 MG/5ML PO SYRP: 10.0000 mL | ORAL_SOLUTION | ORAL | Status: DC | PRN

## 2019-10-28 MED ORDER — ONDANSETRON HCL 4 MG PO TABS: 4.0000 mg | ORAL_TABLET | Freq: Four times a day (QID) | ORAL | Status: DC | PRN

## 2019-10-28 MED ORDER — FAMOTIDINE 20 MG PO TABS
20.0000 mg | ORAL_TABLET | Freq: Two times a day (BID) | ORAL | Status: DC
  Administered 2019-10-28 – 2019-10-31 (×6): 20 mg via ORAL
  Filled 2019-10-28 (×6): qty 1

## 2019-10-28 MED ORDER — OMEGA-3-ACID ETHYL ESTERS 1 G PO CAPS
1.0000 g | ORAL_CAPSULE | Freq: Every day | ORAL | Status: DC
  Administered 2019-10-28 – 2019-10-31 (×4): 1 g via ORAL
  Filled 2019-10-28 (×4): qty 1

## 2019-10-28 MED ORDER — INSULIN ASPART 100 UNIT/ML ~~LOC~~ SOLN
0.0000 [IU] | SUBCUTANEOUS | Status: DC
  Administered 2019-10-28 – 2019-10-30 (×3): 1 [IU] via SUBCUTANEOUS

## 2019-10-28 MED ORDER — ACETAMINOPHEN 325 MG PO TABS: 650.0000 mg | ORAL_TABLET | Freq: Four times a day (QID) | ORAL | Status: DC | PRN

## 2019-10-28 MED ORDER — LEVOTHYROXINE SODIUM 50 MCG PO TABS
50.0000 ug | ORAL_TABLET | Freq: Every day | ORAL | Status: DC
  Administered 2019-10-29 – 2019-10-31 (×3): 50 ug via ORAL
  Filled 2019-10-28 (×3): qty 1

## 2019-10-28 MED ORDER — ONDANSETRON HCL 4 MG/2ML IJ SOLN: 4.0000 mg | Freq: Four times a day (QID) | INTRAMUSCULAR | Status: DC | PRN

## 2019-10-28 MED ORDER — RALOXIFENE HCL 60 MG PO TABS
60.0000 mg | ORAL_TABLET | Freq: Every day | ORAL | Status: DC
  Administered 2019-10-28 – 2019-10-31 (×4): 60 mg via ORAL
  Filled 2019-10-28 (×4): qty 1

## 2019-10-28 MED ORDER — ZINC SULFATE 220 (50 ZN) MG PO CAPS
220.0000 mg | ORAL_CAPSULE | Freq: Every day | ORAL | Status: DC
  Administered 2019-10-28 – 2019-10-31 (×4): 220 mg via ORAL
  Filled 2019-10-28 (×4): qty 1

## 2019-10-28 MED ORDER — ASCORBIC ACID 500 MG PO TABS
500.0000 mg | ORAL_TABLET | Freq: Every day | ORAL | Status: DC
  Administered 2019-10-28 – 2019-10-31 (×4): 500 mg via ORAL
  Filled 2019-10-28 (×4): qty 1

## 2019-10-28 MED ORDER — ENOXAPARIN SODIUM 60 MG/0.6ML ~~LOC~~ SOLN
50.0000 mg | SUBCUTANEOUS | Status: DC
  Administered 2019-10-28 – 2019-10-31 (×4): 50 mg via SUBCUTANEOUS
  Filled 2019-10-28 (×4): qty 0.6

## 2019-10-28 MED ORDER — CARVEDILOL 12.5 MG PO TABS
25.0000 mg | ORAL_TABLET | Freq: Two times a day (BID) | ORAL | Status: DC
  Administered 2019-10-28 – 2019-10-31 (×7): 25 mg via ORAL
  Filled 2019-10-28 (×7): qty 2

## 2019-10-28 MED ORDER — ENOXAPARIN SODIUM 40 MG/0.4ML ~~LOC~~ SOLN: 40.0000 mg | SUBCUTANEOUS | Status: DC

## 2019-10-28 MED ORDER — IPRATROPIUM-ALBUTEROL 20-100 MCG/ACT IN AERS
1.0000 | INHALATION_SPRAY | Freq: Four times a day (QID) | RESPIRATORY_TRACT | Status: DC
  Administered 2019-10-28 – 2019-10-31 (×13): 1 via RESPIRATORY_TRACT
  Filled 2019-10-28: qty 4

## 2019-10-28 MED ORDER — DEXAMETHASONE 6 MG PO TABS
6.0000 mg | ORAL_TABLET | ORAL | Status: DC
  Administered 2019-10-28 – 2019-10-31 (×4): 6 mg via ORAL
  Filled 2019-10-28 (×4): qty 1

## 2019-10-28 MED ORDER — CALCIUM-VITAMIN D 500-200 MG-UNIT PO TABS
ORAL_TABLET | Freq: Two times a day (BID) | ORAL | Status: DC
  Administered 2019-10-28 – 2019-10-31 (×8): 1 via ORAL
  Filled 2019-10-28 (×8): qty 1

## 2019-10-28 NOTE — Progress Notes (Signed)
PROGRESS NOTE                                                                                                                                                                                                             Patient Demographics:    Nicole Baldwin, is a 79 y.o. female, DOB - Sep 21, 1941, [id number]  Admit date - 10/28/2019   Admitting Physician Truddie Hidden, MD  Outpatient Primary MD for the patient is Moon, Amy A, NP  LOS - 0   No chief complaint on file.      Brief Narrative    Is a no charge note as patient admitted earlier today by Dr. Andria Frames, chart, labs and imaging were reviewed  79 y.o. female with medical history significant of HTN, HLD, Chronic diastolic heart failure and Hypothyroidism who presents with SOB and cough since Saturday.  Patient reports she was in [place], no clear sick contacts, until Saturday when she and her son began to develop cough and sob.  She reports she continued to feel poorly throughout the week, had ambulatory testing confirming COVID, but due to progressive SOB, presented to the ER.  She denies fevers but reports chills.  She denies chest pain.  She states she has a loss of appetite but denies anosmia or dysgeusia.  She states she has not eaten for two days now.  She denies any abdominal pain, vomiting or diarrhea.  Non-smoker, no reported EtOH, denies drugs   Subjective:    Nicole Baldwin today for some dyspnea, cough, productive.    Assessment  & Plan :    Active Problems:   Pneumonia due to COVID-19 virus   Acute Hypoxemic Respiratory Failure due to  COVID 19 Pneumonia - patient with 4 L oxygen requirement at presentation, received dose of remdesivir at [facility], continue with Remdesivir and Dexamethasone - proning, ICS, inhalers, oxygen supplementation, supportive care - trend inflammatory markers, continue vitamins  AKI - per ER, Creatinine 1.4, awaiting labs - held lisinopril -  suspect pre-renal due to absent PO intake, thus held torsemide as well - monitor I/O  Chronic Diastolic Heart Failure - follows with cardiology - continue aspirin, coreg, held torsemide and lisinopril  Hyponatremia -Sodium of 125, most likely dehydration and volume depletion, continue to hold torsemide  HLD - continue statin  Hypothyroidism - continue synthroid  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, patient report she has clear margins  COVID-19 Labs  Recent Labs    10/28/19 0810  FERRITIN 112  LDH 216*    No results found for: SARSCOV2NAA   Code Status : Full  Family Communication  :   Disposition Plan  : Home  Barriers For Discharge : 4 L nasal cannula, IV remdesivir and steroids  Consults  : None  Procedures  : None  DVT Prophylaxis  : Lovenox  Lab Results  Component Value Date   PLT 254 10/28/2019    Antibiotics  :    Anti-infectives (From admission, onward)   Start     Dose/Rate Route Frequency Ordered Stop   10/28/19 1000  remdesivir 100 mg in sodium chloride 0.9 % 100 mL IVPB     100 mg 200 mL/hr over 30 Minutes Intravenous Daily 10/28/19 0818 11/01/19 0959        Objective:   Vitals:   10/28/19 0647 10/28/19 1125  BP: 136/71   Pulse: 70   Resp: 20   Temp:  98.2 F (36.8 C)  TempSrc:  Oral  SpO2: 95%   Weight: 98 kg   Height: 5\' 3"  (1.6 m)     Wt Readings from Last 3 Encounters:  10/28/19 98 kg  09/20/19 99.4 kg  04/06/19 98.6 kg    No intake or output data in the 24 hours ending 10/28/19 1431   Physical Exam  Awake Alert, Oriented X 3, No new F.N deficits, Normal affect  Symmetrical Chest wall movement, Good air movement bilaterally, Scattered rales RRR,No Gallops,Rubs or new Murmurs, No Parasternal Heave +ve B.Sounds, Abd Soft, No tenderness,  No rebound - guarding or rigidity. No Cyanosis, Clubbing or edema, No new Rash or bruise      Data Review:    CBC Recent Labs  Lab 10/28/19 0810  WBC  5.7  HGB 11.2*  HCT 33.9*  PLT 254  MCV 91.4  MCH 30.2  MCHC 33.0  RDW 13.0  LYMPHSABS 0.8  MONOABS 0.5  EOSABS 0.0  BASOSABS 0.0    Chemistries  Recent Labs  Lab 10/28/19 0810  NA 125*  K 3.9  CL 83*  CO2 32  GLUCOSE 109*  BUN 28*  CREATININE 1.40*  CALCIUM 8.4*  AST 33  ALT 26  ALKPHOS 39  BILITOT 0.7   ------------------------------------------------------------------------------------------------------------------ No results for input(s): CHOL, HDL, LDLCALC, TRIG, CHOLHDL, LDLDIRECT in the last 72 hours.  No results found for: HGBA1C ------------------------------------------------------------------------------------------------------------------ No results for input(s): TSH, T4TOTAL, T3FREE, THYROIDAB in the last 72 hours.  Invalid input(s): FREET3 ------------------------------------------------------------------------------------------------------------------ Recent Labs    10/28/19 0810  FERRITIN 112    Coagulation profile No results for input(s): INR, PROTIME in the last 168 hours.  No results for input(s): DDIMER in the last 72 hours.  Cardiac Enzymes No results for input(s): CKMB, TROPONINI, MYOGLOBIN in the last 168 hours.  Invalid input(s): CK ------------------------------------------------------------------------------------------------------------------ No results found for: BNP  Inpatient Medications  Scheduled Meds: . vitamin C  500 mg Oral Daily  . aspirin EC  81 mg Oral Daily  . calcium-vitamin D   Oral BID  . carvedilol  25 mg Oral BID WC  . dexamethasone  6 mg Oral Q24H  . enoxaparin (LOVENOX) injection  50 mg Subcutaneous Q24H  . insulin aspart  0-9 Units Subcutaneous Q4H  . Ipratropium-Albuterol  1 puff Inhalation Q6H  . levothyroxine  50 mcg Oral QAC breakfast  .  omega-3 acid ethyl esters  1 g Oral Daily  . raloxifene  60 mg Oral Daily  . simvastatin  20 mg Oral q1800  . zinc sulfate  220 mg Oral Daily   Continuous  Infusions: . remdesivir 100 mg in NS 100 mL 100 mg (10/28/19 1113)   PRN Meds:.acetaminophen, chlorpheniramine-HYDROcodone, guaiFENesin-dextromethorphan, ondansetron **OR** ondansetron (ZOFRAN) IV  Micro Results No results found for this or any previous visit (from the past 240 hour(s)).  Radiology Reports DG Chest Port 1 View  Result Date: 10/28/2019 CLINICAL DATA:  Acute respiratory disease due to COVID-19. EXAM: PORTABLE CHEST 1 VIEW COMPARISON:  CT 10/27/2019.  Chest x-ray 10/27/2019, 01/01/2018. FINDINGS: Mediastinum is stable. Stable cardiomegaly. Multifocal bilateral pulmonary infiltrates are again noted. Similar findings noted on prior exam. No prominent pleural effusion. No pneumothorax. No acute bony abnormality identified. IMPRESSION: Multifocal bilateral pulmonary infiltrates are again noted. Similar findings noted on prior exam. Electronically Signed   By: Marcello Moores  Register   On: 10/28/2019 10:48      Emeline Gins Lori Popowski M.D on 10/28/2019 at 2:31 PM  Between 7am to 7pm - Pager - [phone]  After 7pm go to [email or web address] - password [facility]  [facility] -  Office  [phone]

## 2019-10-28 NOTE — H&P (Signed)
History and Physical    Nicole Baldwin [id number] DOB: [birth year] DOA: 10/28/2019  PCP: Lowella Dandy, NP  Patient coming from: [facility]   I have personally briefly reviewed patient's old medical records in [place]  Chief Complaint: SOB  HPI: Nicole Baldwin is a 79 y.o. female with medical history significant of HTN, HLD, Chronic diastolic heart failure and Hypothyroidism who presents with SOB and cough since Saturday.  Patient reports she was in [place], no clear sick contacts, until Saturday when she and her son began to develop cough and sob.  She reports she continued to feel poorly throughout the week, had ambulatory testing confirming COVID, but due to progressive SOB, presented to the ER.  She denies fevers but reports chills.  She denies chest pain.  She states she has a loss of appetite but denies anosmia or dysgeusia.  She states she has not eaten for two days now.  She denies any abdominal pain, vomiting or diarrhea.  Non-smoker, no reported EtOH, denies drugs  Hesitant to answer code status, still very unsure   Review of Systems: As per HPI otherwise 10 point review of systems negative.    Past Medical History:  Diagnosis Date  . Abnormal echocardiogram 02/03/2018  . Bilateral edema of lower extremity 02/03/2018  . Depression 02/03/2018  . Dyslipidemia 02/03/2018  . Essential hypertension 02/03/2018  . Fatigue 02/03/2018  . Hyperglycemia 02/03/2018  . Hyperlipidemia   . Hypothyroidism 02/03/2018  . Trigeminal neuralgia 02/03/2018    Past Surgical History:  Procedure Laterality Date  . BREAST LUMPECTOMY Right 07/2019  . CARDIAC CATHETERIZATION    . DILATION AND CURETTAGE OF UTERUS    . TUBAL LIGATION       reports that she has never smoked. She has never used smokeless tobacco. She reports previous alcohol use. She reports previous drug use.  No Known Allergies  Family History  Problem Relation Age of Onset  . Breast cancer  Mother   . Hypertension Father   . Diabetes Brother   . Testicular cancer Son   . Colon cancer Son   . Stomach cancer Son     Prior to Admission medications   Medication Sig Start Date End Date Taking? Authorizing Provider  aspirin EC 81 MG tablet Take 81 mg by mouth daily.    [provider]  Calcium Carb-Cholecalciferol (CALCIUM 600+D3) 600-200 MG-UNIT TABS Take 1 tablet by mouth 2 (two) times daily.     [provider]  carvedilol (COREG) 25 MG tablet Take 25 mg by mouth 2 (two) times daily with a meal.    [provider]  levothyroxine (SYNTHROID, LEVOTHROID) 50 MCG tablet Take 50 mcg by mouth daily before breakfast.    [provider]  lisinopril (PRINIVIL,ZESTRIL) 40 MG tablet Take 40 mg by mouth daily.    [provider]  Omega-3 Fatty Acids (FISH OIL) 1200 MG CAPS Take 1 capsule by mouth daily.    [provider]  raloxifene (EVISTA) 60 MG tablet Take 1 tablet by mouth daily. 09/06/19   [provider]  simvastatin (ZOCOR) 20 MG tablet Take 20 mg by mouth daily.    [provider]  torsemide (DEMADEX) 20 MG tablet Take 1 tablet (20 mg) three times daily 08/04/19   Richardo Priest, MD    Physical Exam: Vitals:   10/28/19 0647  BP: 136/71  Pulse: 70  Resp: 20  SpO2: 95%  Weight: 98 kg  Height: 5\' 3"  (1.6 m)    Vitals:   10/28/19 0647  BP: 136/71  Pulse: 70  Resp: 20  SpO2: 95%  Weight: 98 kg  Height: 5\' 3"  (1.6 m)     Constitutional: NAD, calm, comfortable Eyes: EOMI, anicteric sclera ENMT: Mucous membranes are moist. Posterior pharynx clear of any exudate or lesions.Normal dentition. Appears to have a basal cell carcinoma. Neck: normal, supple, no masses, no thyromegaly Respiratory: clear to auscultation bilaterally, no wheezing, no crackles. Normal respiratory effort. No accessory muscle use.  Cardiovascular: Regular rate and rhythm, no murmurs / rubs / gallops. No extremity edema. 2+ pedal  pulses. No carotid bruits.  Abdomen: no tenderness, no masses palpated. No hepatosplenomegaly. Bowel sounds positive.  Musculoskeletal: no clubbing / cyanosis. No joint deformity upper and lower extremities. Good ROM, no contractures. Normal muscle tone.  Skin: no rashes, lesions, ulcers. No induration Neurologic: CN 2-12 grossly intact. Sensation and strength intact Psychiatric: Normal judgment and insight. Alert and oriented x 3. Normal mood.    Labs on Admission: I have personally reviewed following labs and imaging studies  CBC: No results for input(s): WBC, NEUTROABS, HGB, HCT, MCV, PLT in the last 168 hours. Basic Metabolic Panel: No results for input(s): NA, K, CL, CO2, GLUCOSE, BUN, CREATININE, CALCIUM, MG, PHOS in the last 168 hours. GFR: CrCl cannot be calculated (Patient's most recent lab result is older than the maximum 21 days allowed.). Liver Function Tests: No results for input(s): AST, ALT, ALKPHOS, BILITOT, PROT, ALBUMIN in the last 168 hours. No results for input(s): LIPASE, AMYLASE in the last 168 hours. No results for input(s): AMMONIA in the last 168 hours. Coagulation Profile: No results for input(s): INR, PROTIME in the last 168 hours. Cardiac Enzymes: No results for input(s): CKTOTAL, CKMB, CKMBINDEX, TROPONINI in the last 168 hours. BNP (last 3 results) No results for input(s): PROBNP in the last 8760 hours. HbA1C: No results for input(s): HGBA1C in the last 72 hours. CBG: No results for input(s): GLUCAP in the last 168 hours. Lipid Profile: No results for input(s): CHOL, HDL, LDLCALC, TRIG, CHOLHDL, LDLDIRECT in the last 72 hours. Thyroid Function Tests: No results for input(s): TSH, T4TOTAL, FREET4, T3FREE, THYROIDAB in the last 72 hours. Anemia Panel: No results for input(s): VITAMINB12, FOLATE, FERRITIN, TIBC, IRON, RETICCTPCT in the last 72 hours. Urine analysis: No results found for: COLORURINE, APPEARANCEUR, LABSPEC, PHURINE, GLUCOSEU, HGBUR,  BILIRUBINUR, KETONESUR, PROTEINUR, UROBILINOGEN, NITRITE, LEUKOCYTESUR  Radiological Exams on Admission: No results found.  EKG: Independently reviewed.  Assessment/Plan Nicole Baldwin is a 79 y.o. female with medical history significant of HTN, HLD, Chronic diastolic heart failure and Hypothyroidism who presents with SOB and cough since Saturday with acute hypoxemic resp failure 2/2 COVID pneumonia.  # Acute Hypoxemic Respiratory Failure # COVID 19 Pneumonia - patient with 4 L oxygen requirement at presentation, received dose of remdesivir at [facility] ER, continue with Remdesivir and Dexamethasone - proning, ICS, inhalers, oxygen supplementation, supportive care - trend inflammatory markers, continue vitamins  # AKI - per ER, Creatinine 1.4, awaiting labs - held lisinopril - suspect pre-renal due to absent PO intake, thus held torsemide as well - monitor I/O  # Chronic Diastolic Heart Failure - follows with cardiology - continue aspirin, coreg, held torsemide and lisinopril  # HLD - continue statin  # Hypothyroidism - continue synthroid  # Osteoporosis? - would clarify, but patient is on raloxifene  DVT prophylaxis: Lovenox Code Status: Full, patient difficult expressing wishes at this time  Admission status: Inpatient/telemetry   Truddie Hidden MD [facility] Pager [phone]  If 7PM-7AM, please contact night-coverage [email or web address] Password [facility]  10/28/2019, 6:57 AM

## 2019-10-29 LAB — CBC WITH DIFFERENTIAL/PLATELET
Abs Immature Granulocytes: 0.05 10*3/uL (ref 0.00–0.07)
Basophils Absolute: 0 10*3/uL (ref 0.0–0.1)
Basophils Relative: 0 %
Eosinophils Absolute: 0 10*3/uL (ref 0.0–0.5)
Eosinophils Relative: 0 %
HCT: 35 % — ABNORMAL LOW (ref 36.0–46.0)
Hemoglobin: 11.3 g/dL — ABNORMAL LOW (ref 12.0–15.0)
Immature Granulocytes: 1 %
Lymphocytes Relative: 15 %
Lymphs Abs: 0.8 10*3/uL (ref 0.7–4.0)
MCH: 29.8 pg (ref 26.0–34.0)
MCHC: 32.3 g/dL (ref 30.0–36.0)
MCV: 92.3 fL (ref 80.0–100.0)
Monocytes Absolute: 0.4 10*3/uL (ref 0.1–1.0)
Monocytes Relative: 7 %
Neutro Abs: 4.2 10*3/uL (ref 1.7–7.7)
Neutrophils Relative %: 77 %
Platelets: 302 10*3/uL (ref 150–400)
RBC: 3.79 MIL/uL — ABNORMAL LOW (ref 3.87–5.11)
RDW: 12.9 % (ref 11.5–15.5)
WBC: 5.4 10*3/uL (ref 4.0–10.5)
nRBC: 0.4 % — ABNORMAL HIGH (ref 0.0–0.2)

## 2019-10-29 LAB — COMPREHENSIVE METABOLIC PANEL
ALT: 26 U/L (ref 0–44)
AST: 30 U/L (ref 15–41)
Albumin: 3 g/dL — ABNORMAL LOW (ref 3.5–5.0)
Alkaline Phosphatase: 40 U/L (ref 38–126)
Anion gap: 9 (ref 5–15)
BUN: 23 mg/dL (ref 8–23)
CO2: 35 mmol/L — ABNORMAL HIGH (ref 22–32)
Calcium: 9 mg/dL (ref 8.9–10.3)
Chloride: 85 mmol/L — ABNORMAL LOW (ref 98–111)
Creatinine, Ser: 1.04 mg/dL — ABNORMAL HIGH (ref 0.44–1.00)
GFR calc Af Amer: 59 mL/min — ABNORMAL LOW (ref >60)
GFR calc non Af Amer: 51 mL/min — ABNORMAL LOW (ref >60)
Glucose, Bld: 109 mg/dL — ABNORMAL HIGH (ref 70–99)
Potassium: 3.6 mmol/L (ref 3.5–5.1)
Sodium: 129 mmol/L — ABNORMAL LOW (ref 135–145)
Total Bilirubin: 0.6 mg/dL (ref 0.3–1.2)
Total Protein: 6.8 g/dL (ref 6.5–8.1)

## 2019-10-29 LAB — D-DIMER, QUANTITATIVE: D-Dimer, Quant: 2.04 ug/mL-FEU — ABNORMAL HIGH (ref 0.00–0.50)

## 2019-10-29 LAB — PHOSPHORUS: Phosphorus: 2.9 mg/dL (ref 2.5–4.6)

## 2019-10-29 LAB — FERRITIN: Ferritin: 91 ng/mL (ref 11–307)

## 2019-10-29 LAB — MAGNESIUM: Magnesium: 2.4 mg/dL (ref 1.7–2.4)

## 2019-10-29 LAB — GLUCOSE, CAPILLARY
Glucose-Capillary: 115 mg/dL — ABNORMAL HIGH (ref 70–99)
Glucose-Capillary: 90 mg/dL (ref 70–99)
Glucose-Capillary: 117 mg/dL — ABNORMAL HIGH (ref 70–99)
Glucose-Capillary: 113 mg/dL — ABNORMAL HIGH (ref 70–99)
Glucose-Capillary: 112 mg/dL — ABNORMAL HIGH (ref 70–99)

## 2019-10-29 LAB — C-REACTIVE PROTEIN: CRP: 7.4 mg/dL — ABNORMAL HIGH (ref <1.0)

## 2019-10-29 NOTE — Plan of Care (Signed)

## 2019-10-29 NOTE — Progress Notes (Signed)
PROGRESS NOTE                                                                                                                                                                                                             Patient Demographics:    Nicole Baldwin, is a 79 y.o. female, DOB - [birth year], [id number]  Admit date - 10/28/2019   Admitting Physician Truddie Hidden, MD  Outpatient Primary MD for the patient is Moon, Amy A, NP  LOS - 1   No chief complaint on file.      Brief Narrative    79 y.o. female with medical history significant of HTN, HLD, Chronic diastolic heart failure and Hypothyroidism who presents with SOB and cough since Saturday.  Patient reports she was in [place], no clear sick contacts, until Saturday when she and her son began to develop cough and sob.  She reports she continued to feel poorly throughout the week, had ambulatory testing confirming COVID, but due to progressive SOB, presented to the ER.  She denies fevers but reports chills.  She denies chest pain.  She states she has a loss of appetite but denies anosmia or dysgeusia.  She states she has not eaten for two days now.  She denies any abdominal pain, vomiting or diarrhea.  Was transferred to [facility] for further management   Subjective:    [name] today for some dyspnea, cough, productive.    Assessment  & Plan :    Active Problems:   Pneumonia due to COVID-19 virus   Acute Hypoxemic Respiratory Failure due to  COVID 19 Pneumonia -Morning she appears to be on 3 L nasal cannula,. -Continue with IV remdesivir . -Continue with steroids . -Have encouraged her to use incentive spirometry, flutter valve, and stay out of bed to chair, and to prone once back in bed . -Continue to trend inflammatory markers closely . COVID-19 Labs  Recent Labs    10/28/19 0810 10/29/19 0303  DDIMER  --  2.04*  FERRITIN 112 91  LDH 216*  --   CRP  --  7.4*     AKI -Continue 1.4 on admission, improved today to 1.04, continue to hold torsemide and lisinopril,  Chronic Diastolic Heart Failure - continue aspirin, coreg,  -Continue to hold torsemide and lisinopril as she appears to be a  euvolemic  Hyponatremia -Due to volume depletion, improving with holding diuresis .  HLD - continue statin  Hypothyroidism - continue synthroid  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, patient report she has clear margins  COVID-19 Labs  Recent Labs    10/28/19 0810 10/29/19 0303  DDIMER  --  2.04*  FERRITIN 112 91  LDH 216*  --   CRP  --  7.4*    No results found for: SARSCOV2NAA   Code Status : Full  Family Communication  : Tried to call daughter, unable to leave a voicemail  Disposition Plan  : Home  Barriers For Discharge : 3 L nasal cannula, IV remdesivir and steroids  Consults  : None  Procedures  : None  DVT Prophylaxis  : Lovenox  Lab Results  Component Value Date   PLT 302 10/29/2019    Antibiotics  :    Anti-infectives (From admission, onward)   Start     Dose/Rate Route Frequency Ordered Stop   10/28/19 1000  remdesivir 100 mg in sodium chloride 0.9 % 100 mL IVPB     100 mg 200 mL/hr over 30 Minutes Intravenous Daily 10/28/19 0818 11/01/19 0959        Objective:   Vitals:   10/29/19 0000 10/29/19 0505 10/29/19 0523 10/29/19 0720  BP: 139/72 (!) 150/75    Pulse: 63 64    Resp: 20 13 20    Temp: 98.6 F (37 C) 98.6 F (37 C)  98.4 F (36.9 C)  TempSrc: Oral Oral  Oral  SpO2: 96% 100%    Weight:      Height:        Wt Readings from Last 3 Encounters:  10/28/19 98 kg  09/20/19 99.4 kg  04/06/19 98.6 kg     Intake/Output Summary (Last 24 hours) at 10/29/2019 1520 Last data filed at 10/29/2019 0500 Gross per 24 hour  Intake 960 ml  Output 2100 ml  Net -1140 ml     Physical Exam  Awake Alert, Oriented X 3, No new F.N deficits, Normal affect Symmetrical Chest wall movement,  Good air movement bilaterally, CTAB RRR,No Gallops,Rubs or new Murmurs, No Parasternal Heave +ve B.Sounds, Abd Soft, No tenderness, No rebound - guarding or rigidity. No Cyanosis, Clubbing or edema, No new Rash or bruise      Data Review:    CBC Recent Labs  Lab 10/28/19 0810 10/29/19 0303  WBC 5.7 5.4  HGB 11.2* 11.3*  HCT 33.9* 35.0*  PLT 254 302  MCV 91.4 92.3  MCH 30.2 29.8  MCHC 33.0 32.3  RDW 13.0 12.9  LYMPHSABS 0.8 0.8  MONOABS 0.5 0.4  EOSABS 0.0 0.0  BASOSABS 0.0 0.0    Chemistries  Recent Labs  Lab 10/28/19 0810 10/29/19 0303  NA 125* 129*  K 3.9 3.6  CL 83* 85*  CO2 32 35*  GLUCOSE 109* 109*  BUN 28* 23  CREATININE 1.40* 1.04*  CALCIUM 8.4* 9.0  MG  --  2.4  AST 33 30  ALT 26 26  ALKPHOS 39 40  BILITOT 0.7 0.6   ------------------------------------------------------------------------------------------------------------------ No results for input(s): CHOL, HDL, LDLCALC, TRIG, CHOLHDL, LDLDIRECT in the last 72 hours.  No results found for: HGBA1C ------------------------------------------------------------------------------------------------------------------ No results for input(s): TSH, T4TOTAL, T3FREE, THYROIDAB in the last 72 hours.  Invalid input(s): FREET3 ------------------------------------------------------------------------------------------------------------------ Recent Labs    10/28/19 0810 10/29/19 0303  FERRITIN 112 91    Coagulation profile No results for input(s): INR, PROTIME  in the last 168 hours.  Recent Labs    10/29/19 0303  DDIMER 2.04*    Cardiac Enzymes No results for input(s): CKMB, TROPONINI, MYOGLOBIN in the last 168 hours.  Invalid input(s): CK ------------------------------------------------------------------------------------------------------------------ No results found for: BNP  Inpatient Medications  Scheduled Meds: . vitamin C  500 mg Oral Daily  . aspirin EC  81 mg Oral Daily  .  calcium-vitamin D   Oral BID  . carvedilol  25 mg Oral BID WC  . dexamethasone  6 mg Oral Q24H  . enoxaparin (LOVENOX) injection  50 mg Subcutaneous Q24H  . famotidine  20 mg Oral BID  . insulin aspart  0-9 Units Subcutaneous Q4H  . Ipratropium-Albuterol  1 puff Inhalation Q6H  . levothyroxine  50 mcg Oral QAC breakfast  . omega-3 acid ethyl esters  1 g Oral Daily  . raloxifene  60 mg Oral Daily  . simvastatin  20 mg Oral q1800  . zinc sulfate  220 mg Oral Daily   Continuous Infusions: . remdesivir 100 mg in NS 100 mL 100 mg (10/29/19 0905)   PRN Meds:.acetaminophen, chlorpheniramine-HYDROcodone, guaiFENesin-dextromethorphan, ondansetron **OR** ondansetron (ZOFRAN) IV  Micro Results No results found for this or any previous visit (from the past 240 hour(s)).  Radiology Reports DG Chest Port 1 View  Result Date: 10/28/2019 CLINICAL DATA:  Acute respiratory disease due to COVID-19. EXAM: PORTABLE CHEST 1 VIEW COMPARISON:  CT 10/27/2019.  Chest x-ray 10/27/2019, 01/01/2018. FINDINGS: Mediastinum is stable. Stable cardiomegaly. Multifocal bilateral pulmonary infiltrates are again noted. Similar findings noted on prior exam. No prominent pleural effusion. No pneumothorax. No acute bony abnormality identified. IMPRESSION: Multifocal bilateral pulmonary infiltrates are again noted. Similar findings noted on prior exam. Electronically Signed   By: Marcello Moores  Register   On: 10/28/2019 10:48      Emeline Gins Chelsea Nusz M.D on 10/29/2019 at 3:20 PM  Between 7am to 7pm - Pager - [phone]  After 7pm go to [email or web address] - password [email or web address]  [facility] -  Office  [phone]

## 2019-10-30 LAB — CBC WITH DIFFERENTIAL/PLATELET
Abs Immature Granulocytes: 0.12 10*3/uL — ABNORMAL HIGH (ref 0.00–0.07)
Basophils Absolute: 0 10*3/uL (ref 0.0–0.1)
Basophils Relative: 0 %
Eosinophils Absolute: 0 10*3/uL (ref 0.0–0.5)
Eosinophils Relative: 0 %
HCT: 34.6 % — ABNORMAL LOW (ref 36.0–46.0)
Hemoglobin: 11.1 g/dL — ABNORMAL LOW (ref 12.0–15.0)
Immature Granulocytes: 2 %
Lymphocytes Relative: 15 %
Lymphs Abs: 1 10*3/uL (ref 0.7–4.0)
MCH: 29.8 pg (ref 26.0–34.0)
MCHC: 32.1 g/dL (ref 30.0–36.0)
MCV: 92.8 fL (ref 80.0–100.0)
Monocytes Absolute: 0.6 10*3/uL (ref 0.1–1.0)
Monocytes Relative: 9 %
Neutro Abs: 4.9 10*3/uL (ref 1.7–7.7)
Neutrophils Relative %: 74 %
Platelets: 320 10*3/uL (ref 150–400)
RBC: 3.73 MIL/uL — ABNORMAL LOW (ref 3.87–5.11)
RDW: 13.2 % (ref 11.5–15.5)
WBC: 6.7 10*3/uL (ref 4.0–10.5)
nRBC: 0 % (ref 0.0–0.2)

## 2019-10-30 LAB — FERRITIN: Ferritin: 64 ng/mL (ref 11–307)

## 2019-10-30 LAB — COMPREHENSIVE METABOLIC PANEL
ALT: 22 U/L (ref 0–44)
AST: 27 U/L (ref 15–41)
Albumin: 3 g/dL — ABNORMAL LOW (ref 3.5–5.0)
Alkaline Phosphatase: 39 U/L (ref 38–126)
Anion gap: 11 (ref 5–15)
BUN: 16 mg/dL (ref 8–23)
CO2: 35 mmol/L — ABNORMAL HIGH (ref 22–32)
Calcium: 9 mg/dL (ref 8.9–10.3)
Chloride: 87 mmol/L — ABNORMAL LOW (ref 98–111)
Creatinine, Ser: 0.91 mg/dL (ref 0.44–1.00)
GFR calc Af Amer: >60 mL/min (ref >60)
GFR calc non Af Amer: >60 mL/min (ref >60)
Glucose, Bld: 95 mg/dL (ref 70–99)
Potassium: 3.4 mmol/L — ABNORMAL LOW (ref 3.5–5.1)
Sodium: 133 mmol/L — ABNORMAL LOW (ref 135–145)
Total Bilirubin: 0.5 mg/dL (ref 0.3–1.2)
Total Protein: 6.4 g/dL — ABNORMAL LOW (ref 6.5–8.1)

## 2019-10-30 LAB — GLUCOSE, CAPILLARY
Glucose-Capillary: 125 mg/dL — ABNORMAL HIGH (ref 70–99)
Glucose-Capillary: 130 mg/dL — ABNORMAL HIGH (ref 70–99)
Glucose-Capillary: 86 mg/dL (ref 70–99)
Glucose-Capillary: 87 mg/dL (ref 70–99)
Glucose-Capillary: 93 mg/dL (ref 70–99)
Glucose-Capillary: 99 mg/dL (ref 70–99)

## 2019-10-30 LAB — C-REACTIVE PROTEIN: CRP: 3.9 mg/dL — ABNORMAL HIGH (ref <1.0)

## 2019-10-30 LAB — D-DIMER, QUANTITATIVE: D-Dimer, Quant: 1.57 ug/mL-FEU — ABNORMAL HIGH (ref 0.00–0.50)

## 2019-10-30 LAB — PHOSPHORUS: Phosphorus: 2.5 mg/dL (ref 2.5–4.6)

## 2019-10-30 LAB — MAGNESIUM: Magnesium: 2.3 mg/dL (ref 1.7–2.4)

## 2019-10-30 MED ORDER — BISACODYL 5 MG PO TBEC
10.0000 mg | DELAYED_RELEASE_TABLET | Freq: Once | ORAL | Status: AC
  Administered 2019-10-30: 10 mg via ORAL
  Filled 2019-10-30: qty 2

## 2019-10-30 MED ORDER — POLYETHYLENE GLYCOL 3350 17 G PO PACK
34.0000 g | PACK | Freq: Once | ORAL | Status: DC
  Filled 2019-10-30: qty 2

## 2019-10-30 MED ORDER — LIP MEDEX EX OINT
TOPICAL_OINTMENT | CUTANEOUS | Status: DC | PRN
  Filled 2019-10-30: qty 7

## 2019-10-30 MED ORDER — TRAZODONE HCL 50 MG PO TABS
50.0000 mg | ORAL_TABLET | Freq: Every day | ORAL | Status: DC
  Administered 2019-10-30: 50 mg via ORAL
  Filled 2019-10-30: qty 1

## 2019-10-30 MED ORDER — POLYETHYLENE GLYCOL 3350 17 G PO PACK
34.0000 g | PACK | Freq: Once | ORAL | Status: AC
  Administered 2019-10-31: 17 g via ORAL
  Filled 2019-10-30: qty 2

## 2019-10-30 MED ORDER — POTASSIUM CHLORIDE CRYS ER 20 MEQ PO TBCR
40.0000 meq | EXTENDED_RELEASE_TABLET | Freq: Four times a day (QID) | ORAL | Status: AC
  Administered 2019-10-30 (×2): 40 meq via ORAL
  Filled 2019-10-30 (×2): qty 2

## 2019-10-30 NOTE — Progress Notes (Signed)
SATURATION QUALIFICATIONS: (This note is used to comply with regulatory documentation for home oxygen)  Patient Saturations on Room Air at Rest = SPO2 at 94%  Patient Saturations on Room Air while Ambulating = SPO2 at 86%  Patient Saturations on 2 Liters of oxygen while Ambulating =  SPO2 at 92%

## 2019-10-30 NOTE — Progress Notes (Signed)
PROGRESS NOTE                                                                                                                                                                                                             Patient Demographics:    Nicole Baldwin, is a 79 y.o. female, DOB - [birth year], [id number]  Admit date - 10/28/2019   Admitting Physician Truddie Hidden, MD  Outpatient Primary MD for the patient is Moon, Amy A, NP  LOS - 2   No chief complaint on file.      Brief Narrative    79 y.o. female with medical history significant of HTN, HLD, Chronic diastolic heart failure and Hypothyroidism who presents with SOB and cough since Saturday.  Patient reports she was in [place], no clear sick contacts, until Saturday when she and her son began to develop cough and sob.  She reports she continued to feel poorly throughout the week, had ambulatory testing confirming COVID, but due to progressive SOB, presented to the ER.  She denies fevers but reports chills.  She denies chest pain.  She states she has a loss of appetite but denies anosmia or dysgeusia.  She states she has not eaten for two days now.  She denies any abdominal pain, vomiting or diarrhea.  Was transferred to [facility] for further management   Subjective:    Nicole Baldwin today reports she had a good night sleep, dyspnea has improved, reports some cough, less productive today, denies any chest pain or fever .    Assessment  & Plan :    Active Problems:   Pneumonia due to COVID-19 virus   Acute HypoxIc Respiratory Failure due to  COVID 19 Pneumonia -She is on 2 L nasal cannula this morning -Continue with IV remdesivir . -Continue with steroids . -Have encouraged her to use incentive spirometry, flutter valve, and stay out of bed to chair, and to prone once back in bed . -Continue to trend inflammatory markers closely . COVID-19 Labs  Recent Labs    10/28/19 0810 10/29/19 0303  10/30/19 0240  DDIMER  --  2.04* 1.57*  FERRITIN 112 91 64  LDH 216*  --   --   CRP  --  7.4* 3.9*    AKI -Continue 1.4 on admission, resolved, continue to hold torsemide and lisinopril,  Chronic Diastolic Heart Failure - continue aspirin, coreg,  -Continue to hold torsemide and lisinopril as she appears to be a euvolemic  Hyponatremia -Due to volume depletion, improving with holding diuresis .  HLD - continue statin  Hypothyroidism - continue synthroid  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, patient report she has clear margins  COVID-19 Labs  Recent Labs    10/28/19 0810 10/29/19 0303 10/30/19 0240  DDIMER  --  2.04* 1.57*  FERRITIN 112 91 64  LDH 216*  --   --   CRP  --  7.4* 3.9*    No results found for: SARSCOV2NAA   Code Status : Full  Family Communication  : Tried to call daughter 1/16, unable to leave a voicemail  Disposition Plan  : Home  Consults  : None  Procedures  : None  DVT Prophylaxis  : Lovenox  Lab Results  Component Value Date   PLT 320 10/30/2019    Antibiotics  :    Anti-infectives (From admission, onward)   Start     Dose/Rate Route Frequency Ordered Stop   10/28/19 1000  remdesivir 100 mg in sodium chloride 0.9 % 100 mL IVPB     100 mg 200 mL/hr over 30 Minutes Intravenous Daily 10/28/19 0818 11/01/19 0959        Objective:   Vitals:   10/30/19 0444 10/30/19 0453 10/30/19 0705 10/30/19 1148  BP: (!) 112/57     Pulse: 63 75    Resp: 15 18    Temp: 98.2 F (36.8 C)  98.3 F (36.8 C) 98.4 F (36.9 C)  TempSrc: Oral  Oral Oral  SpO2: 96% 93%    Weight:      Height:        Wt Readings from Last 3 Encounters:  10/28/19 98 kg  09/20/19 99.4 kg  04/06/19 98.6 kg     Intake/Output Summary (Last 24 hours) at 10/30/2019 1345 Last data filed at 10/30/2019 0915 Gross per 24 hour  Intake 960 ml  Output 500 ml  Net 460 ml     Physical Exam  Awake Alert, Oriented X 3, No new F.N deficits,  Normal affect Symmetrical Chest wall movement, Good air movement bilaterally, CTAB RRR,No Gallops,Rubs or new Murmurs, No Parasternal Heave +ve B.Sounds, Abd Soft, No tenderness, No rebound - guarding or rigidity. No Cyanosis, Clubbing or edema, No new Rash or bruise       Data Review:    CBC Recent Labs  Lab 10/28/19 0810 10/29/19 0303 10/30/19 0240  WBC 5.7 5.4 6.7  HGB 11.2* 11.3* 11.1*  HCT 33.9* 35.0* 34.6*  PLT 254 302 320  MCV 91.4 92.3 92.8  MCH 30.2 29.8 29.8  MCHC 33.0 32.3 32.1  RDW 13.0 12.9 13.2  LYMPHSABS 0.8 0.8 1.0  MONOABS 0.5 0.4 0.6  EOSABS 0.0 0.0 0.0  BASOSABS 0.0 0.0 0.0    Chemistries  Recent Labs  Lab 10/28/19 0810 10/29/19 0303 10/30/19 0240  NA 125* 129* 133*  K 3.9 3.6 3.4*  CL 83* 85* 87*  CO2 32 35* 35*  GLUCOSE 109* 109* 95  BUN 28* 23 16  CREATININE 1.40* 1.04* 0.91  CALCIUM 8.4* 9.0 9.0  MG  --  2.4 2.3  AST 33 30 27  ALT 26 26 22   ALKPHOS 39 40 39  BILITOT 0.7 0.6 0.5   ------------------------------------------------------------------------------------------------------------------ No results for input(s): CHOL, HDL, LDLCALC, TRIG, CHOLHDL, LDLDIRECT in the last 72 hours.  No  results found for: HGBA1C ------------------------------------------------------------------------------------------------------------------ No results for input(s): TSH, T4TOTAL, T3FREE, THYROIDAB in the last 72 hours.  Invalid input(s): FREET3 ------------------------------------------------------------------------------------------------------------------ Recent Labs    10/29/19 0303 10/30/19 0240  FERRITIN 91 64    Coagulation profile No results for input(s): INR, PROTIME in the last 168 hours.  Recent Labs    10/29/19 0303 10/30/19 0240  DDIMER 2.04* 1.57*    Cardiac Enzymes No results for input(s): CKMB, TROPONINI, MYOGLOBIN in the last 168 hours.  Invalid input(s):  CK ------------------------------------------------------------------------------------------------------------------ No results found for: BNP  Inpatient Medications  Scheduled Meds: . vitamin C  500 mg Oral Daily  . aspirin EC  81 mg Oral Daily  . calcium-vitamin D   Oral BID  . carvedilol  25 mg Oral BID WC  . dexamethasone  6 mg Oral Q24H  . enoxaparin (LOVENOX) injection  50 mg Subcutaneous Q24H  . famotidine  20 mg Oral BID  . insulin aspart  0-9 Units Subcutaneous Q4H  . Ipratropium-Albuterol  1 puff Inhalation Q6H  . levothyroxine  50 mcg Oral QAC breakfast  . omega-3 acid ethyl esters  1 g Oral Daily  . potassium chloride  40 mEq Oral Q6H  . raloxifene  60 mg Oral Daily  . simvastatin  20 mg Oral q1800  . traZODone  50 mg Oral QHS  . zinc sulfate  220 mg Oral Daily   Continuous Infusions: . remdesivir 100 mg in NS 100 mL Stopped (10/30/19 1051)   PRN Meds:.acetaminophen, chlorpheniramine-HYDROcodone, guaiFENesin-dextromethorphan, ondansetron **OR** ondansetron (ZOFRAN) IV  Micro Results No results found for this or any previous visit (from the past 240 hour(s)).  Radiology Reports DG Chest Port 1 View  Result Date: 10/28/2019 CLINICAL DATA:  Acute respiratory disease due to COVID-19. EXAM: PORTABLE CHEST 1 VIEW COMPARISON:  CT 10/27/2019.  Chest x-ray 10/27/2019, 01/01/2018. FINDINGS: Mediastinum is stable. Stable cardiomegaly. Multifocal bilateral pulmonary infiltrates are again noted. Similar findings noted on prior exam. No prominent pleural effusion. No pneumothorax. No acute bony abnormality identified. IMPRESSION: Multifocal bilateral pulmonary infiltrates are again noted. Similar findings noted on prior exam. Electronically Signed   By: Marcello Moores  Register   On: 10/28/2019 10:48      Emeline Gins Larysa Pall M.D on 10/30/2019 at 1:45 PM  Between 7am to 7pm - Pager - [phone]  After 7pm go to [email or web address] - password [email or web address]  [facility] -  Office   [phone]

## 2019-10-31 LAB — CBC WITH DIFFERENTIAL/PLATELET
Abs Immature Granulocytes: 0.09 10*3/uL — ABNORMAL HIGH (ref 0.00–0.07)
Basophils Absolute: 0 10*3/uL (ref 0.0–0.1)
Basophils Relative: 0 %
Eosinophils Absolute: 0 10*3/uL (ref 0.0–0.5)
Eosinophils Relative: 0 %
HCT: 34.5 % — ABNORMAL LOW (ref 36.0–46.0)
Hemoglobin: 10.9 g/dL — ABNORMAL LOW (ref 12.0–15.0)
Immature Granulocytes: 1 %
Lymphocytes Relative: 18 %
Lymphs Abs: 1.3 10*3/uL (ref 0.7–4.0)
MCH: 29.4 pg (ref 26.0–34.0)
MCHC: 31.6 g/dL (ref 30.0–36.0)
MCV: 93 fL (ref 80.0–100.0)
Monocytes Absolute: 1 10*3/uL (ref 0.1–1.0)
Monocytes Relative: 13 %
Neutro Abs: 4.8 10*3/uL (ref 1.7–7.7)
Neutrophils Relative %: 68 %
Platelets: 323 10*3/uL (ref 150–400)
RBC: 3.71 MIL/uL — ABNORMAL LOW (ref 3.87–5.11)
RDW: 13.6 % (ref 11.5–15.5)
WBC: 7.2 10*3/uL (ref 4.0–10.5)
nRBC: 0 % (ref 0.0–0.2)

## 2019-10-31 LAB — COMPREHENSIVE METABOLIC PANEL
ALT: 22 U/L (ref 0–44)
AST: 27 U/L (ref 15–41)
Albumin: 3 g/dL — ABNORMAL LOW (ref 3.5–5.0)
Alkaline Phosphatase: 37 U/L — ABNORMAL LOW (ref 38–126)
Anion gap: 7 (ref 5–15)
BUN: 13 mg/dL (ref 8–23)
CO2: 33 mmol/L — ABNORMAL HIGH (ref 22–32)
Calcium: 8.8 mg/dL — ABNORMAL LOW (ref 8.9–10.3)
Chloride: 96 mmol/L — ABNORMAL LOW (ref 98–111)
Creatinine, Ser: 0.79 mg/dL (ref 0.44–1.00)
GFR calc Af Amer: >60 mL/min (ref >60)
GFR calc non Af Amer: >60 mL/min (ref >60)
Glucose, Bld: 89 mg/dL (ref 70–99)
Potassium: 4.1 mmol/L (ref 3.5–5.1)
Sodium: 136 mmol/L (ref 135–145)
Total Bilirubin: 0.8 mg/dL (ref 0.3–1.2)
Total Protein: 6.1 g/dL — ABNORMAL LOW (ref 6.5–8.1)

## 2019-10-31 LAB — GLUCOSE, CAPILLARY
Glucose-Capillary: 110 mg/dL — ABNORMAL HIGH (ref 70–99)
Glucose-Capillary: 116 mg/dL — ABNORMAL HIGH (ref 70–99)
Glucose-Capillary: 116 mg/dL — ABNORMAL HIGH (ref 70–99)
Glucose-Capillary: 128 mg/dL — ABNORMAL HIGH (ref 70–99)
Glucose-Capillary: 135 mg/dL — ABNORMAL HIGH (ref 70–99)
Glucose-Capillary: 80 mg/dL (ref 70–99)
Glucose-Capillary: 90 mg/dL (ref 70–99)

## 2019-10-31 LAB — C-REACTIVE PROTEIN: CRP: 2.3 mg/dL — ABNORMAL HIGH (ref <1.0)

## 2019-10-31 LAB — D-DIMER, QUANTITATIVE: D-Dimer, Quant: 1.44 ug/mL-FEU — ABNORMAL HIGH (ref 0.00–0.50)

## 2019-10-31 LAB — MAGNESIUM: Magnesium: 2.4 mg/dL (ref 1.7–2.4)

## 2019-10-31 LAB — PHOSPHORUS: Phosphorus: 2.5 mg/dL (ref 2.5–4.6)

## 2019-10-31 LAB — FERRITIN: Ferritin: 40 ng/mL (ref 11–307)

## 2019-10-31 MED ORDER — DEXAMETHASONE 6 MG PO TABS: 6.0000 mg | ORAL_TABLET | Freq: Every day | ORAL | 0 refills | Status: AC

## 2019-10-31 MED ORDER — ACETAMINOPHEN 325 MG PO TABS: 650.0000 mg | ORAL_TABLET | Freq: Four times a day (QID) | ORAL | Status: DC | PRN

## 2019-10-31 MED ORDER — TORSEMIDE 20 MG PO TABS: ORAL_TABLET | ORAL | 0 refills | Status: DC

## 2019-10-31 NOTE — Progress Notes (Signed)
Patient was provided with discharge information with also educate son when he comes to pick patient up. Education with teach back provided on pulse oximeter and home oxygen equipment. Patient is now sitting up on recliner chair waiting for son to arrive.

## 2019-10-31 NOTE — Discharge Summary (Signed)
Nicole Baldwin, is a 79 y.o. female  DOB 1941-06-02  MRN [id number].  Admission date:  10/28/2019  Admitting Physician  Truddie Hidden, MD  Discharge Date:  10/31/2019   Primary MD  Lowella Dandy, NP  Recommendations for primary care physician for things to follow:  - please check CBC, CMP during next visit   Admission Diagnosis  COVID-19 [U07.1] Hypoxia [R09.02] Pneumonia [J18.9] Pneumonia due to COVID-19 virus [U07.1, J12.82]   Discharge Diagnosis  COVID-19 [U07.1] Hypoxia [R09.02] Pneumonia [J18.9] Pneumonia due to COVID-19 virus [U07.1, J12.82]     Active Problems:   Pneumonia due to COVID-19 virus      Past Medical History:  Diagnosis Date  . Abnormal echocardiogram 02/03/2018  . Bilateral edema of lower extremity 02/03/2018  . Depression 02/03/2018  . Dyslipidemia 02/03/2018  . Essential hypertension 02/03/2018  . Fatigue 02/03/2018  . Hyperglycemia 02/03/2018  . Hyperlipidemia   . Hypothyroidism 02/03/2018  . Trigeminal neuralgia 02/03/2018    Past Surgical History:  Procedure Laterality Date  . BREAST LUMPECTOMY Right 07/2019  . CARDIAC CATHETERIZATION    . DILATION AND CURETTAGE OF UTERUS    . TUBAL LIGATION         History of present illness and  Hospital Course:     Kindly see H&P for history of present illness and admission details, please review complete Labs, Consult reports and Test reports for all details in brief  HPI  from the history and physical done on the day of admission 10/28/2019  HPI: Nicole Baldwin is a 79 y.o. female with medical history significant of HTN, HLD, Chronic diastolic heart failure and Hypothyroidism who presents with SOB and cough since Saturday.  Patient reports she was in [place], no clear sick contacts, until Saturday when she and her son began to develop cough and sob.  She reports she continued to feel poorly throughout the  week, had ambulatory testing confirming COVID, but due to progressive SOB, presented to the ER.  She denies fevers but reports chills.  She denies chest pain.  She states she has a loss of appetite but denies anosmia or dysgeusia.  She states she has not eaten for two days now.  She denies any abdominal pain, vomiting or diarrhea.  Non-smoker, no reported EtOH, denies drugs  Hesitant to answer code status, still very unsure   Hospital Course    Acute Hypoxemic Respiratory Failure due to  COVID 19 Pneumonia -M patient initially on 3 to 4 L oxygen requirement, this has improved, currently requiring oxygen 2 L mainly on ambulation, dyspnea significantly improved, cough has subsided as well, she finished total of 5 days of IV remdesivir, and 5 days of steroids, to finish another 5 days of oral Decadron as an outpatient for total of 10 days treatment, she was encouraged to take her incentive spirometry, flutter valve and to keep using them at home. COVID-19 Labs  Recent Labs    10/29/19 0303 10/30/19 0240 10/31/19 0340  DDIMER 2.04* 1.57*  1.44*  FERRITIN 91 64 40  CRP 7.4* 3.9* 2.3*    No results found for: SARSCOV2NAA   AKI -Continue 1.4 on admission, this has resolved, renal function back to baseline, she was instructed to resume lisinopril on discharge, given elevated blood pressure once holding it, and to resume torsemide after 3 days  Chronic Diastolic Heart Failure - continue aspirin, coreg,  and lisinopril, appears euvolemic at time of discharge, instructed to resume her home dose torsemide after 3 days  Hyponatremia -Due to volume depletion, solved with holding diuresis  HLD - continue statin  Hypothyroidism - continue synthroid  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   Specialty: Internal  Medicine Contact information: [place] [id number] [phone]             Discharge Instructions  and  Discharge Medications     Discharge Instructions    Discharge instructions   Complete by: As directed    Follow with Primary MD Manson Allan, Amy A, NP in 14 days   Get CBC, CMP, 2 view checked  by Primary MD next visit.    Activity: As tolerated with Full fall precautions use walker/cane & assistance as needed   Disposition Home    Diet: Heart Healthy ** , with feeding assistance and aspiration precautions.  For Heart failure patients - Check your Weight same time everyday, if you gain over 2 pounds, or you develop in leg swelling, experience more shortness of breath or chest pain, call your Primary MD immediately. Follow Cardiac Low Salt Diet and 1.5 lit/day fluid restriction.   On your next visit with your primary care physician please Get Medicines reviewed and adjusted.   Please request your Prim.MD to go over all Hospital Tests and Procedure/Radiological results at the follow up, please get all Hospital records sent to your Prim MD by signing hospital release before you go home.   If you experience worsening of your admission symptoms, develop shortness of breath, life threatening emergency, suicidal or homicidal thoughts you must seek medical attention immediately by calling 911 or calling your MD immediately  if symptoms less severe.  You Must read complete instructions/literature along with all the possible adverse reactions/side effects for all the Medicines you take and that have been prescribed to you. Take any new Medicines after you have completely understood and accpet all the possible adverse reactions/side effects.   Do not drive, operating heavy machinery, perform activities at heights, swimming or participation in water activities or provide baby sitting services if your were admitted for syncope or siezures until you have seen by  Primary MD or a Neurologist and advised to do so again.  Do not drive when taking Pain medications.    Do not take more than prescribed Pain, Sleep and Anxiety Medications  Special Instructions: If you have smoked or chewed Tobacco  in the last 2 yrs please stop smoking, stop any regular Alcohol  and or any Recreational drug use.  Wear Seat belts while driving.   Please note  You were cared for by a hospitalist during your hospital stay. If you have any questions about your discharge medications or the care you received while you were in the hospital after you are discharged, you can call the unit and  asked to speak with the hospitalist on call if the hospitalist that took care of you is not available. Once you are discharged, your primary care physician will handle any further medical issues. Please note that NO REFILLS for any discharge medications will be authorized once you are discharged, as it is imperative that you return to your primary care physician (or establish a relationship with a primary care physician if you do not have one) for your aftercare needs so that they can reassess your need for medications and monitor your lab values.   Increase activity slowly   Complete by: As directed      Allergies as of 10/31/2019   No Known Allergies     Medication List    TAKE these medications   acetaminophen 325 MG tablet Commonly known as: TYLENOL Take 2 tablets (650 mg total) by mouth every 6 (six) hours as needed for mild pain or headache (fever >/= 101).   aspirin EC 81 MG tablet Take 81 mg by mouth daily.   Calcium 600+D3 600-200 MG-UNIT Tabs Generic drug: Calcium Carb-Cholecalciferol Take 1 tablet by mouth 2 (two) times daily.   carvedilol 25 MG tablet Commonly known as: COREG Take 25 mg by mouth 2 (two) times daily with a meal.   dexamethasone 6 MG tablet Commonly known as: DECADRON Take 1 tablet (6 mg total) by mouth daily for 5 days. Start taking on: November 01, 2019   Fish Oil 1200 MG Caps Take 1 capsule by mouth daily.   levothyroxine 50 MCG tablet Commonly known as: SYNTHROID Take 50 mcg by mouth daily before breakfast.   lisinopril 40 MG tablet Commonly known as: ZESTRIL Take 40 mg by mouth daily.   ondansetron 4 MG tablet Commonly known as: ZOFRAN Take 4 mg by mouth every 4 (four) hours as needed for nausea/vomiting.   polyvinyl alcohol 1.4 % ophthalmic solution Commonly known as: LIQUIFILM TEARS Place 1 drop into both eyes every 6 (six) hours as needed for dry eyes.   raloxifene 60 MG tablet Commonly known as: EVISTA Take 60 mg by mouth daily.   simvastatin 20 MG tablet Commonly known as: ZOCOR Take 20 mg by mouth daily.   torsemide 20 MG tablet Commonly known as: DEMADEX Take 1 tablet (20 mg) three times daily Resume after 3 days from discharge on 11/04/2019 What changed: additional instructions            Durable Medical Equipment  (From admission, onward)         Start     Ordered   10/31/19 1221  DME 3-in-1  Once     10/31/19 1221   10/30/19 1350  DME Oxygen  (Discharge Planning)  Once    Question Answer Comment  Length of Need 6 Months   Mode or (Route) Nasal cannula   Liters per Minute 2   Frequency Continuous (stationary and portable oxygen unit needed)   Oxygen conserving device Yes   Oxygen delivery system Gas      10/30/19 1350            Diet and Activity recommendation: See Discharge Instructions above   Consults obtained -  None   Major procedures and Radiology Reports - PLEASE review detailed and final reports for all details, in brief -      DG Chest Port 1 View  Result Date: 10/28/2019 CLINICAL DATA:  Acute respiratory disease due to COVID-19. EXAM: PORTABLE CHEST 1 VIEW COMPARISON:  CT 10/27/2019.  Chest  x-ray 10/27/2019, 01/01/2018. FINDINGS: Mediastinum is stable. Stable cardiomegaly. Multifocal bilateral pulmonary infiltrates are again noted. Similar findings noted on  prior exam. No prominent pleural effusion. No pneumothorax. No acute bony abnormality identified. IMPRESSION: Multifocal bilateral pulmonary infiltrates are again noted. Similar findings noted on prior exam. Electronically Signed   By: Pen Mar   On: 10/28/2019 10:48    Micro Results    No results found for this or any previous visit (from the past 240 hour(s)).     Today   Subjective:   Nicole Baldwin today has no headache,no chest or abdominal pain,no new weakness tingling or numbness, feels much better wants to go home today.   Objective:   Blood pressure (!) 146/71, pulse 65, temperature 98.4 F (36.9 C), temperature source Oral, resp. rate 18, height 5\' 3"  (1.6 m), weight 98 kg, SpO2 92 %.   Intake/Output Summary (Last 24 hours) at 10/31/2019 1223 Last data filed at 10/31/2019 0800 Gross per 24 hour  Intake 1080 ml  Output 2 ml  Net 1078 ml    Exam Awake Alert, Oriented x 3, No new F.N deficits, Normal affect Symmetrical Chest wall movement, Good air movement bilaterally, CTAB RRR,No Gallops,Rubs or new Murmurs, No Parasternal Heave +ve B.Sounds, Abd Soft, Non tender, No rebound -guarding or rigidity. No Cyanosis, Clubbing or edema, No new Rash or bruise  Data Review   CBC w Diff:  Lab Results  Component Value Date   WBC 7.2 10/31/2019   HGB 10.9 (L) 10/31/2019   HCT 34.5 (L) 10/31/2019   PLT 323 10/31/2019   LYMPHOPCT 18 10/31/2019   MONOPCT 13 10/31/2019   EOSPCT 0 10/31/2019   BASOPCT 0 10/31/2019    CMP:  Lab Results  Component Value Date   NA 136 10/31/2019   NA 139 10/07/2018   K 4.1 10/31/2019   CL 96 (L) 10/31/2019   CO2 33 (H) 10/31/2019   BUN 13 10/31/2019   BUN 12 10/07/2018   CREATININE 0.79 10/31/2019   PROT 6.1 (L) 10/31/2019   ALBUMIN 3.0 (L) 10/31/2019   BILITOT 0.8 10/31/2019   ALKPHOS 37 (L) 10/31/2019   AST 27 10/31/2019   ALT 22 10/31/2019  .   Total Time in preparing paper work, data evaluation and todays exam -  17 minutes  Phillips Climes M.D on 10/31/2019 at 12:23 PM  [facility]   Office  [phone]

## 2019-10-31 NOTE — TOC Transition Note (Signed)
Transition of Care [facility]) - CM/SW Discharge Note   Patient Details  Name: Nicole Baldwin MRN: [id number] Date of Birth: November 01, 1940  [facility] CM/SW Contact:  Shade Flood, LCSW Phone Number: 10/31/2019, 12:51 PM   Clinical Narrative:     Pt stable for dc home today per MD. Pt will need Home O2 and 3in1 for dc. Arranged with Thedore Mins at [name]. AC to bring 3in1 and portable tank to pt's room.  [employer] to deliver [facility] portable concentrator to pt at the hospital and she can take it with her home.   Updated pt's RN. Family will transport home once DME has arrived.  There are no other TOC needs for dc.  Final next level of care: Home/Self Care Barriers to Discharge: Barriers Resolved   Patient Goals and CMS Choice        Discharge Placement                       Discharge Plan and Services                DME Arranged: 3-N-1, Oxygen DME Agency: [employer] Date DME Agency Contacted: 10/31/19   Representative spoke with at DME Agency: Thedore Mins            Social Determinants of Health ([place]) Interventions     Readmission Risk Interventions No flowsheet data found.

## 2019-10-31 NOTE — Discharge Instructions (Signed)
Person Under Monitoring Name: Nicole Baldwin  Location: [place] [address]   Infection Prevention Recommendations for Individuals Confirmed to have, or Being Evaluated for, 2019 Novel Coronavirus (COVID-19) Infection Who Receive Care at Home  Individuals who are confirmed to have, or are being evaluated for, COVID-19 should follow the prevention steps below until a healthcare provider or local or state health department says they can return to normal activities.  Stay home except to get medical care You should restrict activities outside your home, except for getting medical care. Do not go to work, school, or public areas, and do not use public transportation or taxis.  Call ahead before visiting your doctor Before your medical appointment, call the healthcare provider and tell them that you have, or are being evaluated for, COVID-19 infection. This will help the healthcare provider's office take steps to keep other people from getting infected. Ask your healthcare provider to call the local or state health department.  Monitor your symptoms Seek prompt medical attention if your illness is worsening (e.g., difficulty breathing). Before going to your medical appointment, call the healthcare provider and tell them that you have, or are being evaluated for, COVID-19 infection. Ask your healthcare provider to call the local or state health department.  Wear a facemask You should wear a facemask that covers your nose and mouth when you are in the same room with other people and when you visit a healthcare provider. People who live with or visit you should also wear a facemask while they are in the same room with you.  Separate yourself from other people in your home As much as possible, you should stay in a different room from other people in your home. Also, you should use a separate bathroom, if available.  Avoid sharing household items You  should not share dishes, drinking glasses, cups, eating utensils, towels, bedding, or other items with other people in your home. After using these items, you should wash them thoroughly with soap and water.  Cover your coughs and sneezes Cover your mouth and nose with a tissue when you cough or sneeze, or you can cough or sneeze into your sleeve. Throw used tissues in a lined trash can, and immediately wash your hands with soap and water for at least 20 seconds or use an alcohol-based hand rub.  Wash your [facility] your hands often and thoroughly with soap and water for at least 20 seconds. You can use an alcohol-based hand sanitizer if soap and water are not available and if your hands are not visibly dirty. Avoid touching your eyes, nose, and mouth with unwashed hands.   Prevention Steps for Caregivers and Household Members of Individuals Confirmed to have, or Being Evaluated for, COVID-19 Infection Being Cared for in the Home  If you live with, or provide care at home for, a person confirmed to have, or being evaluated for, COVID-19 infection please follow these guidelines to prevent infection:  Follow healthcare provider's instructions Make sure that you understand and can help the patient follow any healthcare provider instructions for all care.  Provide for the patient's basic needs You should help the patient with basic needs in the home and provide support for getting groceries, prescriptions, and other personal needs.  Monitor the patient's symptoms If they are getting sicker, call his or her medical provider and tell them that the patient has, or is being evaluated for, COVID-19 infection. This will help the healthcare  provider's office take steps to keep other people from getting infected. Ask the healthcare provider to call the local or state health department.  Limit the number of people who have contact with the patient  If possible, have only one caregiver for the  patient.  Other household members should stay in another home or place of residence. If this is not possible, they should stay  in another room, or be separated from the patient as much as possible. Use a separate bathroom, if available.  Restrict visitors who do not have an essential need to be in the home.  Keep older adults, very young children, and other sick people away from the patient Keep older adults, very young children, and those who have compromised immune systems or chronic health conditions away from the patient. This includes people with chronic heart, lung, or kidney conditions, diabetes, and cancer.  Ensure good ventilation Make sure that shared spaces in the home have good air flow, such as from an air conditioner or an opened window, weather permitting.  Wash your hands often  Wash your hands often and thoroughly with soap and water for at least 20 seconds. You can use an alcohol based hand sanitizer if soap and water are not available and if your hands are not visibly dirty.  Avoid touching your eyes, nose, and mouth with unwashed hands.  Use disposable paper towels to dry your hands. If not available, use dedicated cloth towels and replace them when they become wet.  Wear a facemask and gloves  Wear a disposable facemask at all times in the room and gloves when you touch or have contact with the patient's blood, body fluids, and/or secretions or excretions, such as sweat, saliva, sputum, nasal mucus, vomit, urine, or feces.  Ensure the mask fits over your nose and mouth tightly, and do not touch it during use.  Throw out disposable facemasks and gloves after using them. Do not reuse.  Wash your hands immediately after removing your facemask and gloves.  If your personal clothing becomes contaminated, carefully remove clothing and launder. Wash your hands after handling contaminated clothing.  Place all used disposable facemasks, gloves, and other waste in a lined  container before disposing them with other household waste.  Remove gloves and wash your hands immediately after handling these items.  Do not share dishes, glasses, or other household items with the patient  Avoid sharing household items. You should not share dishes, drinking glasses, cups, eating utensils, towels, bedding, or other items with a patient who is confirmed to have, or being evaluated for, COVID-19 infection.  After the person uses these items, you should wash them thoroughly with soap and water.  Wash laundry thoroughly  Immediately remove and wash clothes or bedding that have blood, body fluids, and/or secretions or excretions, such as sweat, saliva, sputum, nasal mucus, vomit, urine, or feces, on them.  Wear gloves when handling laundry from the patient.  Read and follow directions on labels of laundry or clothing items and detergent. In general, wash and dry with the warmest temperatures recommended on the label.  Clean all areas the individual has used often  Clean all touchable surfaces, such as counters, tabletops, doorknobs, bathroom fixtures, toilets, phones, keyboards, tablets, and bedside tables, every day. Also, clean any surfaces that may have blood, body fluids, and/or secretions or excretions on them.  Wear gloves when cleaning surfaces the patient has come in contact with.  Use a diluted bleach solution (e.g., dilute bleach with  1 part bleach and 10 parts water) or a household disinfectant with a label that says EPA-registered for coronaviruses. To make a bleach solution at home, add 1 tablespoon of bleach to 1 quart (4 cups) of water. For a larger supply, add  cup of bleach to 1 gallon (16 cups) of water.  Read labels of cleaning products and follow recommendations provided on product labels. Labels contain instructions for safe and effective use of the cleaning product including precautions you should take when applying the product, such as wearing gloves or  eye protection and making sure you have good ventilation during use of the product.  Remove gloves and wash hands immediately after cleaning.  Monitor yourself for signs and symptoms of illness Caregivers and household members are considered close contacts, should monitor their health, and will be asked to limit movement outside of the home to the extent possible. Follow the monitoring steps for close contacts listed on the symptom monitoring form.   ? If you have additional questions, contact your local health department or call the epidemiologist on call at [phone] (available 24/7). ? This guidance is subject to change. For the most up-to-date guidance from [facility], please refer to their website: YouBlogs.pl

## 2019-11-02 ENCOUNTER — Other Ambulatory Visit: Payer: Self-pay | Admitting: Cardiology

## 2019-11-04 ENCOUNTER — Telehealth: Payer: Self-pay | Admitting: Cardiology

## 2019-11-04 ENCOUNTER — Other Ambulatory Visit: Payer: Self-pay

## 2019-11-04 MED ORDER — TORSEMIDE 20 MG PO TABS: ORAL_TABLET | ORAL | 0 refills | Status: DC

## 2019-11-04 NOTE — Progress Notes (Signed)
Sent torsemide to pharmacy.

## 2019-11-04 NOTE — Telephone Encounter (Signed)
*  STAT* If patient is at the pharmacy, call can be transferred to refill team.   1. Which medications need to be refilled? (please list name of each medication and dose if known) torsemide (DEMADEX) 20 MG tablet  2. Which pharmacy/location (including street and city if local pharmacy) is medication to be sent to? [place], [place] [address]  3. Do they need a 30 day or 90 day supply? 90 day   Patient is out of medication

## 2019-11-04 NOTE — Telephone Encounter (Signed)
Sent Torsemide to pharmacy.

## 2019-11-05 DIAGNOSIS — R040 Epistaxis: Secondary | ICD-10-CM | POA: Diagnosis not present

## 2019-11-07 DIAGNOSIS — Z6838 Body mass index (BMI) 38.0-38.9, adult: Secondary | ICD-10-CM | POA: Diagnosis not present

## 2019-11-07 DIAGNOSIS — E538 Deficiency of other specified B group vitamins: Secondary | ICD-10-CM | POA: Diagnosis not present

## 2019-11-07 DIAGNOSIS — U071 COVID-19: Secondary | ICD-10-CM | POA: Diagnosis not present

## 2019-11-07 DIAGNOSIS — J9601 Acute respiratory failure with hypoxia: Secondary | ICD-10-CM | POA: Diagnosis not present

## 2019-11-07 DIAGNOSIS — R531 Weakness: Secondary | ICD-10-CM | POA: Diagnosis not present

## 2019-11-07 DIAGNOSIS — F419 Anxiety disorder, unspecified: Secondary | ICD-10-CM | POA: Diagnosis not present

## 2019-11-11 DIAGNOSIS — Z6837 Body mass index (BMI) 37.0-37.9, adult: Secondary | ICD-10-CM | POA: Diagnosis not present

## 2019-11-11 DIAGNOSIS — R531 Weakness: Secondary | ICD-10-CM | POA: Diagnosis not present

## 2019-11-11 DIAGNOSIS — J9601 Acute respiratory failure with hypoxia: Secondary | ICD-10-CM | POA: Diagnosis not present

## 2019-11-11 DIAGNOSIS — S0120XS Unspecified open wound of nose, sequela: Secondary | ICD-10-CM | POA: Diagnosis not present

## 2019-11-11 DIAGNOSIS — U071 COVID-19: Secondary | ICD-10-CM | POA: Diagnosis not present

## 2019-11-11 DIAGNOSIS — I5032 Chronic diastolic (congestive) heart failure: Secondary | ICD-10-CM | POA: Diagnosis not present

## 2019-11-11 DIAGNOSIS — I10 Essential (primary) hypertension: Secondary | ICD-10-CM | POA: Diagnosis not present

## 2019-11-11 DIAGNOSIS — F419 Anxiety disorder, unspecified: Secondary | ICD-10-CM | POA: Diagnosis not present

## 2019-11-14 DIAGNOSIS — F419 Anxiety disorder, unspecified: Secondary | ICD-10-CM | POA: Diagnosis not present

## 2019-11-14 DIAGNOSIS — E538 Deficiency of other specified B group vitamins: Secondary | ICD-10-CM | POA: Diagnosis not present

## 2019-11-14 DIAGNOSIS — G4700 Insomnia, unspecified: Secondary | ICD-10-CM | POA: Diagnosis not present

## 2019-11-14 DIAGNOSIS — U071 COVID-19: Secondary | ICD-10-CM | POA: Diagnosis not present

## 2019-11-14 DIAGNOSIS — J1282 Pneumonia due to coronavirus disease 2019: Secondary | ICD-10-CM | POA: Diagnosis not present

## 2019-11-14 DIAGNOSIS — G500 Trigeminal neuralgia: Secondary | ICD-10-CM | POA: Diagnosis not present

## 2019-11-14 DIAGNOSIS — J9601 Acute respiratory failure with hypoxia: Secondary | ICD-10-CM | POA: Diagnosis not present

## 2019-11-14 DIAGNOSIS — H6500 Acute serous otitis media, unspecified ear: Secondary | ICD-10-CM | POA: Diagnosis not present

## 2019-11-14 DIAGNOSIS — F329 Major depressive disorder, single episode, unspecified: Secondary | ICD-10-CM | POA: Diagnosis not present

## 2019-11-15 DIAGNOSIS — F329 Major depressive disorder, single episode, unspecified: Secondary | ICD-10-CM | POA: Diagnosis not present

## 2019-11-15 DIAGNOSIS — F419 Anxiety disorder, unspecified: Secondary | ICD-10-CM | POA: Diagnosis not present

## 2019-11-15 DIAGNOSIS — J9601 Acute respiratory failure with hypoxia: Secondary | ICD-10-CM | POA: Diagnosis not present

## 2019-11-15 DIAGNOSIS — G4700 Insomnia, unspecified: Secondary | ICD-10-CM | POA: Diagnosis not present

## 2019-11-15 DIAGNOSIS — E538 Deficiency of other specified B group vitamins: Secondary | ICD-10-CM | POA: Diagnosis not present

## 2019-11-15 DIAGNOSIS — H6500 Acute serous otitis media, unspecified ear: Secondary | ICD-10-CM | POA: Diagnosis not present

## 2019-11-15 DIAGNOSIS — U071 COVID-19: Secondary | ICD-10-CM | POA: Diagnosis not present

## 2019-11-15 DIAGNOSIS — J1282 Pneumonia due to coronavirus disease 2019: Secondary | ICD-10-CM | POA: Diagnosis not present

## 2019-11-15 DIAGNOSIS — G500 Trigeminal neuralgia: Secondary | ICD-10-CM | POA: Diagnosis not present

## 2019-11-16 DIAGNOSIS — J9601 Acute respiratory failure with hypoxia: Secondary | ICD-10-CM | POA: Diagnosis not present

## 2019-11-16 DIAGNOSIS — F419 Anxiety disorder, unspecified: Secondary | ICD-10-CM | POA: Diagnosis not present

## 2019-11-16 DIAGNOSIS — U071 COVID-19: Secondary | ICD-10-CM | POA: Diagnosis not present

## 2019-11-16 DIAGNOSIS — G500 Trigeminal neuralgia: Secondary | ICD-10-CM | POA: Diagnosis not present

## 2019-11-16 DIAGNOSIS — E538 Deficiency of other specified B group vitamins: Secondary | ICD-10-CM | POA: Diagnosis not present

## 2019-11-16 DIAGNOSIS — J1282 Pneumonia due to coronavirus disease 2019: Secondary | ICD-10-CM | POA: Diagnosis not present

## 2019-11-16 DIAGNOSIS — H6500 Acute serous otitis media, unspecified ear: Secondary | ICD-10-CM | POA: Diagnosis not present

## 2019-11-16 DIAGNOSIS — G4700 Insomnia, unspecified: Secondary | ICD-10-CM | POA: Diagnosis not present

## 2019-11-16 DIAGNOSIS — F329 Major depressive disorder, single episode, unspecified: Secondary | ICD-10-CM | POA: Diagnosis not present

## 2019-11-19 DIAGNOSIS — F329 Major depressive disorder, single episode, unspecified: Secondary | ICD-10-CM | POA: Diagnosis not present

## 2019-11-19 DIAGNOSIS — F419 Anxiety disorder, unspecified: Secondary | ICD-10-CM | POA: Diagnosis not present

## 2019-11-19 DIAGNOSIS — J1282 Pneumonia due to coronavirus disease 2019: Secondary | ICD-10-CM | POA: Diagnosis not present

## 2019-11-19 DIAGNOSIS — E538 Deficiency of other specified B group vitamins: Secondary | ICD-10-CM | POA: Diagnosis not present

## 2019-11-19 DIAGNOSIS — H6500 Acute serous otitis media, unspecified ear: Secondary | ICD-10-CM | POA: Diagnosis not present

## 2019-11-19 DIAGNOSIS — J9601 Acute respiratory failure with hypoxia: Secondary | ICD-10-CM | POA: Diagnosis not present

## 2019-11-19 DIAGNOSIS — G4700 Insomnia, unspecified: Secondary | ICD-10-CM | POA: Diagnosis not present

## 2019-11-19 DIAGNOSIS — U071 COVID-19: Secondary | ICD-10-CM | POA: Diagnosis not present

## 2019-11-19 DIAGNOSIS — G500 Trigeminal neuralgia: Secondary | ICD-10-CM | POA: Diagnosis not present

## 2019-11-21 DIAGNOSIS — F419 Anxiety disorder, unspecified: Secondary | ICD-10-CM | POA: Diagnosis not present

## 2019-11-21 DIAGNOSIS — J9601 Acute respiratory failure with hypoxia: Secondary | ICD-10-CM | POA: Diagnosis not present

## 2019-11-21 DIAGNOSIS — J1282 Pneumonia due to coronavirus disease 2019: Secondary | ICD-10-CM | POA: Diagnosis not present

## 2019-11-21 DIAGNOSIS — U071 COVID-19: Secondary | ICD-10-CM | POA: Diagnosis not present

## 2019-11-21 DIAGNOSIS — E538 Deficiency of other specified B group vitamins: Secondary | ICD-10-CM | POA: Diagnosis not present

## 2019-11-21 DIAGNOSIS — F329 Major depressive disorder, single episode, unspecified: Secondary | ICD-10-CM | POA: Diagnosis not present

## 2019-11-21 DIAGNOSIS — G4700 Insomnia, unspecified: Secondary | ICD-10-CM | POA: Diagnosis not present

## 2019-11-21 DIAGNOSIS — G500 Trigeminal neuralgia: Secondary | ICD-10-CM | POA: Diagnosis not present

## 2019-11-21 DIAGNOSIS — H6500 Acute serous otitis media, unspecified ear: Secondary | ICD-10-CM | POA: Diagnosis not present

## 2019-11-22 ENCOUNTER — Other Ambulatory Visit: Payer: Self-pay

## 2019-11-22 DIAGNOSIS — J9601 Acute respiratory failure with hypoxia: Secondary | ICD-10-CM | POA: Diagnosis not present

## 2019-11-22 DIAGNOSIS — K5900 Constipation, unspecified: Secondary | ICD-10-CM | POA: Diagnosis not present

## 2019-11-22 DIAGNOSIS — Z6836 Body mass index (BMI) 36.0-36.9, adult: Secondary | ICD-10-CM | POA: Diagnosis not present

## 2019-11-22 DIAGNOSIS — R531 Weakness: Secondary | ICD-10-CM | POA: Diagnosis not present

## 2019-11-22 DIAGNOSIS — R300 Dysuria: Secondary | ICD-10-CM | POA: Diagnosis not present

## 2019-11-23 DIAGNOSIS — E538 Deficiency of other specified B group vitamins: Secondary | ICD-10-CM | POA: Diagnosis not present

## 2019-11-23 DIAGNOSIS — F329 Major depressive disorder, single episode, unspecified: Secondary | ICD-10-CM | POA: Diagnosis not present

## 2019-11-23 DIAGNOSIS — J1282 Pneumonia due to coronavirus disease 2019: Secondary | ICD-10-CM | POA: Diagnosis not present

## 2019-11-23 DIAGNOSIS — G500 Trigeminal neuralgia: Secondary | ICD-10-CM | POA: Diagnosis not present

## 2019-11-23 DIAGNOSIS — J9601 Acute respiratory failure with hypoxia: Secondary | ICD-10-CM | POA: Diagnosis not present

## 2019-11-23 DIAGNOSIS — G4700 Insomnia, unspecified: Secondary | ICD-10-CM | POA: Diagnosis not present

## 2019-11-23 DIAGNOSIS — U071 COVID-19: Secondary | ICD-10-CM | POA: Diagnosis not present

## 2019-11-23 DIAGNOSIS — H6500 Acute serous otitis media, unspecified ear: Secondary | ICD-10-CM | POA: Diagnosis not present

## 2019-11-23 DIAGNOSIS — F419 Anxiety disorder, unspecified: Secondary | ICD-10-CM | POA: Diagnosis not present

## 2019-11-25 DIAGNOSIS — E538 Deficiency of other specified B group vitamins: Secondary | ICD-10-CM | POA: Diagnosis not present

## 2019-11-25 DIAGNOSIS — H6500 Acute serous otitis media, unspecified ear: Secondary | ICD-10-CM | POA: Diagnosis not present

## 2019-11-25 DIAGNOSIS — F329 Major depressive disorder, single episode, unspecified: Secondary | ICD-10-CM | POA: Diagnosis not present

## 2019-11-25 DIAGNOSIS — J9601 Acute respiratory failure with hypoxia: Secondary | ICD-10-CM | POA: Diagnosis not present

## 2019-11-25 DIAGNOSIS — F419 Anxiety disorder, unspecified: Secondary | ICD-10-CM | POA: Diagnosis not present

## 2019-11-25 DIAGNOSIS — J1282 Pneumonia due to coronavirus disease 2019: Secondary | ICD-10-CM | POA: Diagnosis not present

## 2019-11-25 DIAGNOSIS — U071 COVID-19: Secondary | ICD-10-CM | POA: Diagnosis not present

## 2019-11-25 DIAGNOSIS — G500 Trigeminal neuralgia: Secondary | ICD-10-CM | POA: Diagnosis not present

## 2019-11-25 DIAGNOSIS — G4700 Insomnia, unspecified: Secondary | ICD-10-CM | POA: Diagnosis not present

## 2019-11-28 DIAGNOSIS — H6500 Acute serous otitis media, unspecified ear: Secondary | ICD-10-CM | POA: Diagnosis not present

## 2019-11-28 DIAGNOSIS — J1282 Pneumonia due to coronavirus disease 2019: Secondary | ICD-10-CM | POA: Diagnosis not present

## 2019-11-28 DIAGNOSIS — F419 Anxiety disorder, unspecified: Secondary | ICD-10-CM | POA: Diagnosis not present

## 2019-11-28 DIAGNOSIS — G4700 Insomnia, unspecified: Secondary | ICD-10-CM | POA: Diagnosis not present

## 2019-11-28 DIAGNOSIS — G500 Trigeminal neuralgia: Secondary | ICD-10-CM | POA: Diagnosis not present

## 2019-11-28 DIAGNOSIS — U071 COVID-19: Secondary | ICD-10-CM | POA: Diagnosis not present

## 2019-11-28 DIAGNOSIS — E538 Deficiency of other specified B group vitamins: Secondary | ICD-10-CM | POA: Diagnosis not present

## 2019-11-28 DIAGNOSIS — F329 Major depressive disorder, single episode, unspecified: Secondary | ICD-10-CM | POA: Diagnosis not present

## 2019-11-28 DIAGNOSIS — J9601 Acute respiratory failure with hypoxia: Secondary | ICD-10-CM | POA: Diagnosis not present

## 2019-12-01 DIAGNOSIS — U071 COVID-19: Secondary | ICD-10-CM | POA: Diagnosis not present

## 2019-12-02 DIAGNOSIS — U071 COVID-19: Secondary | ICD-10-CM | POA: Diagnosis not present

## 2019-12-02 DIAGNOSIS — G4700 Insomnia, unspecified: Secondary | ICD-10-CM | POA: Diagnosis not present

## 2019-12-02 DIAGNOSIS — G500 Trigeminal neuralgia: Secondary | ICD-10-CM | POA: Diagnosis not present

## 2019-12-02 DIAGNOSIS — H6500 Acute serous otitis media, unspecified ear: Secondary | ICD-10-CM | POA: Diagnosis not present

## 2019-12-02 DIAGNOSIS — E538 Deficiency of other specified B group vitamins: Secondary | ICD-10-CM | POA: Diagnosis not present

## 2019-12-02 DIAGNOSIS — F329 Major depressive disorder, single episode, unspecified: Secondary | ICD-10-CM | POA: Diagnosis not present

## 2019-12-02 DIAGNOSIS — J9601 Acute respiratory failure with hypoxia: Secondary | ICD-10-CM | POA: Diagnosis not present

## 2019-12-02 DIAGNOSIS — F419 Anxiety disorder, unspecified: Secondary | ICD-10-CM | POA: Diagnosis not present

## 2019-12-02 DIAGNOSIS — J1282 Pneumonia due to coronavirus disease 2019: Secondary | ICD-10-CM | POA: Diagnosis not present

## 2019-12-05 DIAGNOSIS — E538 Deficiency of other specified B group vitamins: Secondary | ICD-10-CM | POA: Diagnosis not present

## 2019-12-05 DIAGNOSIS — U071 COVID-19: Secondary | ICD-10-CM | POA: Diagnosis not present

## 2019-12-05 DIAGNOSIS — J1282 Pneumonia due to coronavirus disease 2019: Secondary | ICD-10-CM | POA: Diagnosis not present

## 2019-12-05 DIAGNOSIS — H6500 Acute serous otitis media, unspecified ear: Secondary | ICD-10-CM | POA: Diagnosis not present

## 2019-12-05 DIAGNOSIS — F329 Major depressive disorder, single episode, unspecified: Secondary | ICD-10-CM | POA: Diagnosis not present

## 2019-12-05 DIAGNOSIS — G4700 Insomnia, unspecified: Secondary | ICD-10-CM | POA: Diagnosis not present

## 2019-12-05 DIAGNOSIS — F419 Anxiety disorder, unspecified: Secondary | ICD-10-CM | POA: Diagnosis not present

## 2019-12-05 DIAGNOSIS — G500 Trigeminal neuralgia: Secondary | ICD-10-CM | POA: Diagnosis not present

## 2019-12-05 DIAGNOSIS — J9601 Acute respiratory failure with hypoxia: Secondary | ICD-10-CM | POA: Diagnosis not present

## 2019-12-07 DIAGNOSIS — E538 Deficiency of other specified B group vitamins: Secondary | ICD-10-CM | POA: Diagnosis not present

## 2019-12-07 DIAGNOSIS — G4700 Insomnia, unspecified: Secondary | ICD-10-CM | POA: Diagnosis not present

## 2019-12-07 DIAGNOSIS — F419 Anxiety disorder, unspecified: Secondary | ICD-10-CM | POA: Diagnosis not present

## 2019-12-07 DIAGNOSIS — U071 COVID-19: Secondary | ICD-10-CM | POA: Diagnosis not present

## 2019-12-07 DIAGNOSIS — J1282 Pneumonia due to coronavirus disease 2019: Secondary | ICD-10-CM | POA: Diagnosis not present

## 2019-12-07 DIAGNOSIS — H6500 Acute serous otitis media, unspecified ear: Secondary | ICD-10-CM | POA: Diagnosis not present

## 2019-12-07 DIAGNOSIS — G500 Trigeminal neuralgia: Secondary | ICD-10-CM | POA: Diagnosis not present

## 2019-12-07 DIAGNOSIS — J9601 Acute respiratory failure with hypoxia: Secondary | ICD-10-CM | POA: Diagnosis not present

## 2019-12-07 DIAGNOSIS — F329 Major depressive disorder, single episode, unspecified: Secondary | ICD-10-CM | POA: Diagnosis not present

## 2019-12-09 DIAGNOSIS — H6500 Acute serous otitis media, unspecified ear: Secondary | ICD-10-CM | POA: Diagnosis not present

## 2019-12-09 DIAGNOSIS — F329 Major depressive disorder, single episode, unspecified: Secondary | ICD-10-CM | POA: Diagnosis not present

## 2019-12-09 DIAGNOSIS — U071 COVID-19: Secondary | ICD-10-CM | POA: Diagnosis not present

## 2019-12-09 DIAGNOSIS — E538 Deficiency of other specified B group vitamins: Secondary | ICD-10-CM | POA: Diagnosis not present

## 2019-12-09 DIAGNOSIS — G4700 Insomnia, unspecified: Secondary | ICD-10-CM | POA: Diagnosis not present

## 2019-12-09 DIAGNOSIS — G500 Trigeminal neuralgia: Secondary | ICD-10-CM | POA: Diagnosis not present

## 2019-12-09 DIAGNOSIS — J9601 Acute respiratory failure with hypoxia: Secondary | ICD-10-CM | POA: Diagnosis not present

## 2019-12-09 DIAGNOSIS — F419 Anxiety disorder, unspecified: Secondary | ICD-10-CM | POA: Diagnosis not present

## 2019-12-09 DIAGNOSIS — J1282 Pneumonia due to coronavirus disease 2019: Secondary | ICD-10-CM | POA: Diagnosis not present

## 2019-12-13 DIAGNOSIS — J1282 Pneumonia due to coronavirus disease 2019: Secondary | ICD-10-CM | POA: Diagnosis not present

## 2019-12-13 DIAGNOSIS — E538 Deficiency of other specified B group vitamins: Secondary | ICD-10-CM | POA: Diagnosis not present

## 2019-12-13 DIAGNOSIS — F419 Anxiety disorder, unspecified: Secondary | ICD-10-CM | POA: Diagnosis not present

## 2019-12-13 DIAGNOSIS — G500 Trigeminal neuralgia: Secondary | ICD-10-CM | POA: Diagnosis not present

## 2019-12-13 DIAGNOSIS — U071 COVID-19: Secondary | ICD-10-CM | POA: Diagnosis not present

## 2019-12-13 DIAGNOSIS — F329 Major depressive disorder, single episode, unspecified: Secondary | ICD-10-CM | POA: Diagnosis not present

## 2019-12-13 DIAGNOSIS — G4700 Insomnia, unspecified: Secondary | ICD-10-CM | POA: Diagnosis not present

## 2019-12-13 DIAGNOSIS — H6500 Acute serous otitis media, unspecified ear: Secondary | ICD-10-CM | POA: Diagnosis not present

## 2019-12-13 DIAGNOSIS — J9601 Acute respiratory failure with hypoxia: Secondary | ICD-10-CM | POA: Diagnosis not present

## 2019-12-14 DIAGNOSIS — F329 Major depressive disorder, single episode, unspecified: Secondary | ICD-10-CM | POA: Diagnosis not present

## 2019-12-14 DIAGNOSIS — F419 Anxiety disorder, unspecified: Secondary | ICD-10-CM | POA: Diagnosis not present

## 2019-12-14 DIAGNOSIS — H6500 Acute serous otitis media, unspecified ear: Secondary | ICD-10-CM | POA: Diagnosis not present

## 2019-12-14 DIAGNOSIS — G4700 Insomnia, unspecified: Secondary | ICD-10-CM | POA: Diagnosis not present

## 2019-12-14 DIAGNOSIS — U071 COVID-19: Secondary | ICD-10-CM | POA: Diagnosis not present

## 2019-12-14 DIAGNOSIS — J1282 Pneumonia due to coronavirus disease 2019: Secondary | ICD-10-CM | POA: Diagnosis not present

## 2019-12-14 DIAGNOSIS — E538 Deficiency of other specified B group vitamins: Secondary | ICD-10-CM | POA: Diagnosis not present

## 2019-12-14 DIAGNOSIS — J9601 Acute respiratory failure with hypoxia: Secondary | ICD-10-CM | POA: Diagnosis not present

## 2019-12-14 DIAGNOSIS — G500 Trigeminal neuralgia: Secondary | ICD-10-CM | POA: Diagnosis not present

## 2019-12-16 DIAGNOSIS — H6500 Acute serous otitis media, unspecified ear: Secondary | ICD-10-CM | POA: Diagnosis not present

## 2019-12-16 DIAGNOSIS — J1282 Pneumonia due to coronavirus disease 2019: Secondary | ICD-10-CM | POA: Diagnosis not present

## 2019-12-16 DIAGNOSIS — F329 Major depressive disorder, single episode, unspecified: Secondary | ICD-10-CM | POA: Diagnosis not present

## 2019-12-16 DIAGNOSIS — E538 Deficiency of other specified B group vitamins: Secondary | ICD-10-CM | POA: Diagnosis not present

## 2019-12-16 DIAGNOSIS — J9601 Acute respiratory failure with hypoxia: Secondary | ICD-10-CM | POA: Diagnosis not present

## 2019-12-16 DIAGNOSIS — G4700 Insomnia, unspecified: Secondary | ICD-10-CM | POA: Diagnosis not present

## 2019-12-16 DIAGNOSIS — G500 Trigeminal neuralgia: Secondary | ICD-10-CM | POA: Diagnosis not present

## 2019-12-16 DIAGNOSIS — F419 Anxiety disorder, unspecified: Secondary | ICD-10-CM | POA: Diagnosis not present

## 2019-12-16 DIAGNOSIS — U071 COVID-19: Secondary | ICD-10-CM | POA: Diagnosis not present

## 2019-12-20 DIAGNOSIS — I10 Essential (primary) hypertension: Secondary | ICD-10-CM | POA: Diagnosis not present

## 2019-12-20 DIAGNOSIS — I5032 Chronic diastolic (congestive) heart failure: Secondary | ICD-10-CM | POA: Diagnosis not present

## 2019-12-20 DIAGNOSIS — E538 Deficiency of other specified B group vitamins: Secondary | ICD-10-CM | POA: Diagnosis not present

## 2019-12-20 DIAGNOSIS — H6500 Acute serous otitis media, unspecified ear: Secondary | ICD-10-CM | POA: Diagnosis not present

## 2019-12-20 DIAGNOSIS — J9601 Acute respiratory failure with hypoxia: Secondary | ICD-10-CM | POA: Diagnosis not present

## 2019-12-20 DIAGNOSIS — Z6839 Body mass index (BMI) 39.0-39.9, adult: Secondary | ICD-10-CM | POA: Diagnosis not present

## 2019-12-20 DIAGNOSIS — R531 Weakness: Secondary | ICD-10-CM | POA: Diagnosis not present

## 2019-12-20 DIAGNOSIS — F329 Major depressive disorder, single episode, unspecified: Secondary | ICD-10-CM | POA: Diagnosis not present

## 2019-12-20 DIAGNOSIS — G500 Trigeminal neuralgia: Secondary | ICD-10-CM | POA: Diagnosis not present

## 2019-12-20 DIAGNOSIS — F419 Anxiety disorder, unspecified: Secondary | ICD-10-CM | POA: Diagnosis not present

## 2019-12-20 DIAGNOSIS — E039 Hypothyroidism, unspecified: Secondary | ICD-10-CM | POA: Diagnosis not present

## 2019-12-20 DIAGNOSIS — U071 COVID-19: Secondary | ICD-10-CM | POA: Diagnosis not present

## 2019-12-20 DIAGNOSIS — J1282 Pneumonia due to coronavirus disease 2019: Secondary | ICD-10-CM | POA: Diagnosis not present

## 2019-12-20 DIAGNOSIS — G4700 Insomnia, unspecified: Secondary | ICD-10-CM | POA: Diagnosis not present

## 2019-12-22 DIAGNOSIS — M8589 Other specified disorders of bone density and structure, multiple sites: Secondary | ICD-10-CM | POA: Diagnosis not present

## 2019-12-22 DIAGNOSIS — Z79810 Long term (current) use of selective estrogen receptor modulators (SERMs): Secondary | ICD-10-CM | POA: Diagnosis not present

## 2019-12-22 DIAGNOSIS — C50211 Malignant neoplasm of upper-inner quadrant of right female breast: Secondary | ICD-10-CM | POA: Diagnosis not present

## 2019-12-22 DIAGNOSIS — Z86000 Personal history of in-situ neoplasm of breast: Secondary | ICD-10-CM | POA: Diagnosis not present

## 2019-12-23 DIAGNOSIS — G500 Trigeminal neuralgia: Secondary | ICD-10-CM | POA: Diagnosis not present

## 2019-12-23 DIAGNOSIS — F419 Anxiety disorder, unspecified: Secondary | ICD-10-CM | POA: Diagnosis not present

## 2019-12-23 DIAGNOSIS — U071 COVID-19: Secondary | ICD-10-CM | POA: Diagnosis not present

## 2019-12-23 DIAGNOSIS — J9601 Acute respiratory failure with hypoxia: Secondary | ICD-10-CM | POA: Diagnosis not present

## 2019-12-23 DIAGNOSIS — J1282 Pneumonia due to coronavirus disease 2019: Secondary | ICD-10-CM | POA: Diagnosis not present

## 2019-12-23 DIAGNOSIS — G4700 Insomnia, unspecified: Secondary | ICD-10-CM | POA: Diagnosis not present

## 2019-12-23 DIAGNOSIS — F329 Major depressive disorder, single episode, unspecified: Secondary | ICD-10-CM | POA: Diagnosis not present

## 2019-12-23 DIAGNOSIS — E538 Deficiency of other specified B group vitamins: Secondary | ICD-10-CM | POA: Diagnosis not present

## 2019-12-23 DIAGNOSIS — H6500 Acute serous otitis media, unspecified ear: Secondary | ICD-10-CM | POA: Diagnosis not present

## 2019-12-29 DIAGNOSIS — U071 COVID-19: Secondary | ICD-10-CM | POA: Diagnosis not present

## 2020-01-16 DIAGNOSIS — E785 Hyperlipidemia, unspecified: Secondary | ICD-10-CM | POA: Diagnosis not present

## 2020-01-16 DIAGNOSIS — Z9181 History of falling: Secondary | ICD-10-CM | POA: Diagnosis not present

## 2020-01-16 DIAGNOSIS — Z0000 Encounter for general adult medical examination without abnormal findings: Secondary | ICD-10-CM | POA: Diagnosis not present

## 2020-01-16 DIAGNOSIS — Z6839 Body mass index (BMI) 39.0-39.9, adult: Secondary | ICD-10-CM | POA: Diagnosis not present

## 2020-01-16 DIAGNOSIS — E669 Obesity, unspecified: Secondary | ICD-10-CM | POA: Diagnosis not present

## 2020-01-16 DIAGNOSIS — Z1331 Encounter for screening for depression: Secondary | ICD-10-CM | POA: Diagnosis not present

## 2020-01-27 ENCOUNTER — Other Ambulatory Visit: Payer: Self-pay | Admitting: *Deleted

## 2020-01-27 MED ORDER — TORSEMIDE 20 MG PO TABS: ORAL_TABLET | ORAL | 0 refills | Status: DC

## 2020-01-27 NOTE — Telephone Encounter (Signed)
Received fax request for Torsemide from [facility]. Rx sent to pharmacy

## 2020-01-29 DIAGNOSIS — U071 COVID-19: Secondary | ICD-10-CM | POA: Diagnosis not present

## 2020-02-14 DIAGNOSIS — Z6841 Body Mass Index (BMI) 40.0 and over, adult: Secondary | ICD-10-CM | POA: Diagnosis not present

## 2020-02-14 DIAGNOSIS — I10 Essential (primary) hypertension: Secondary | ICD-10-CM | POA: Diagnosis not present

## 2020-02-14 DIAGNOSIS — E785 Hyperlipidemia, unspecified: Secondary | ICD-10-CM | POA: Diagnosis not present

## 2020-02-14 DIAGNOSIS — I5032 Chronic diastolic (congestive) heart failure: Secondary | ICD-10-CM | POA: Diagnosis not present

## 2020-02-14 DIAGNOSIS — R739 Hyperglycemia, unspecified: Secondary | ICD-10-CM | POA: Diagnosis not present

## 2020-02-14 DIAGNOSIS — E039 Hypothyroidism, unspecified: Secondary | ICD-10-CM | POA: Diagnosis not present

## 2020-02-14 DIAGNOSIS — D0591 Unspecified type of carcinoma in situ of right breast: Secondary | ICD-10-CM | POA: Diagnosis not present

## 2020-02-28 DIAGNOSIS — U071 COVID-19: Secondary | ICD-10-CM | POA: Diagnosis not present

## 2020-03-13 DIAGNOSIS — I10 Essential (primary) hypertension: Secondary | ICD-10-CM | POA: Diagnosis not present

## 2020-03-13 DIAGNOSIS — E669 Obesity, unspecified: Secondary | ICD-10-CM | POA: Diagnosis not present

## 2020-03-13 DIAGNOSIS — G4734 Idiopathic sleep related nonobstructive alveolar hypoventilation: Secondary | ICD-10-CM | POA: Diagnosis not present

## 2020-03-13 DIAGNOSIS — Z6841 Body Mass Index (BMI) 40.0 and over, adult: Secondary | ICD-10-CM | POA: Diagnosis not present

## 2020-03-13 DIAGNOSIS — R531 Weakness: Secondary | ICD-10-CM | POA: Diagnosis not present

## 2020-03-13 DIAGNOSIS — I5032 Chronic diastolic (congestive) heart failure: Secondary | ICD-10-CM | POA: Diagnosis not present

## 2020-03-27 DIAGNOSIS — R0902 Hypoxemia: Secondary | ICD-10-CM | POA: Diagnosis not present

## 2020-03-27 DIAGNOSIS — J449 Chronic obstructive pulmonary disease, unspecified: Secondary | ICD-10-CM | POA: Diagnosis not present

## 2020-03-30 DIAGNOSIS — U071 COVID-19: Secondary | ICD-10-CM | POA: Diagnosis not present

## 2020-04-06 DIAGNOSIS — R0902 Hypoxemia: Secondary | ICD-10-CM | POA: Diagnosis not present

## 2020-04-06 DIAGNOSIS — G4734 Idiopathic sleep related nonobstructive alveolar hypoventilation: Secondary | ICD-10-CM | POA: Diagnosis not present

## 2020-04-06 DIAGNOSIS — I5032 Chronic diastolic (congestive) heart failure: Secondary | ICD-10-CM | POA: Diagnosis not present

## 2020-04-11 ENCOUNTER — Other Ambulatory Visit: Payer: Self-pay

## 2020-04-11 NOTE — Progress Notes (Signed)
Cardiology Office Note:    Date:  04/12/2020   ID:  Nicole Baldwin, DOB 1941/09/01, MRN [id number]  PCP:  Lowella Dandy, NP  Cardiologist:  Shirlee More, MD    Referring MD: Lowella Dandy, NP    ASSESSMENT:    1. Hypertensive heart disease with heart failure ([place])   2. Chronic diastolic heart failure ([place])   3. Left bundle branch block   4. Aortic stenosis, mild   5. Nonrheumatic mitral valve stenosis   6. COVID-19 virus infection    PLAN:    In order of problems listed above:  1. Although her weight hasn't changed and she didn't realize she is edematous she obviously is in decompensated heart failure. The differential diagnosis also includes venous thromboembolism and pseudoheart failure from obstructive sleep apnea. Symptomatically she is improved with oxygen. Further evaluation D-dimer duplex lower extremities proBNP level and increase her diuretics I'll see her back in a few weeks to assess response. At this time I don't think she requires a repeat EKG and she has stable left bundle branch block aortic and mitral stenosis. Differential diagnosis also includes chronic lung scarring related to COVID-19 pneumonia and will also do a chest x-ray.   Next appointment: 3 to 4 weeks   Medication Adjustments/Labs and Tests Ordered: Current medicines are reviewed at length with the patient today.  Concerns regarding medicines are outlined above.  No orders of the defined types were placed in this encounter.  No orders of the defined types were placed in this encounter.   Chief Complaint  Patient presents with  . Follow-up  . Congestive Heart Failure    History of Present Illness:    Nicole Baldwin is a 79 y.o. female with a hx of hypertension, edema, heart failure, left bundle branch block  mild aortic and mitral stenosis and mild MR last seen 09/20/1999. Compliance with diet, lifestyle and medications: Yes  Echocardiogram 09/15/2019 showed ejection fraction 55 to  60% with mild hypertrophy.  Doppler showed mild mitral regurgitation mild mitral stenosis nonrheumatic mild aortic valve stenosis.  At that time blood pressure is at target and heart failure is compensated.  Her EKG 10/28/2019 admitted with COVID-19 [facility] showed multifocal bilateral pulmonary infiltrates that were persistent. Her EKG 10/28/2019 showed sinus rhythm left bundle branch block  She was admitted to the [place] January COVID-19 pneumonia stayed 5 days. Came home on oxygen did well oxygen discontinued. Recently increasingly short of breath had a sleep study done was found to have nocturnal hypoxia was placed on nasal oxygen currently using 1 L at bedtime. Prior to the oxygen she was having orthopnea and PND this is resolved she is short of breath outdoors when she climbs stairs. She is at increased risk of venous thromboembolism with her breast cancer and hormone deprivation as well as COVID-19 pneumonia. She has had no leg pain pleuritic chest pain or shortness of breath is not paroxysmal. No cough or wheezing. Viewed her testing during that hospitalization Past Medical History:  Diagnosis Date  . Abdominal distention   . Abnormal echocardiogram 02/03/2018  . Abnormal weight gain   . Acute bronchitis   . Adult BMI 37.0-37.9 kg/sq m   . Anxiety   . At moderate risk for fall   . Bilateral edema of lower extremity 02/03/2018  . Body mass index 40.0-44.9, adult ([place])   . Chest pain   . Chronic diastolic heart failure ([place])   . Depression  02/03/2018  . 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 Stage 0 (Tis, N0, M0)  . Dyslipidemia 02/03/2018  . Dyspnea on effort   . Essential hypertension 02/03/2018  . Fatigue 02/03/2018  . General weakness   . Generalized obesity   . Hematoma of lower extremity, left, initial encounter   . Hyperglycemia 02/03/2018  . Hyperlipidemia   . Hypertensive  heart disease with heart failure ([place]) 02/11/2018  . Hypothyroidism 02/03/2018  . Hypoxemia   . Insomnia   . Left bundle branch block 04/06/2019  . Malaise and fatigue   . 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 Mitral regurgitation 04/06/2019  . Mitral stenosis 10/12/2018  . Nocturnal hypoxia   . Otitis media, acute serous   . 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 Pneumonia due to COVID-19 virus 10/28/2019  . Postoperative examination 09/21/2019  . Trigeminal neuralgia 02/03/2018  . Vitamin D deficiency     Past Surgical History:  Procedure Laterality Date  . BREAST LUMPECTOMY Right 07/2019  . CARDIAC CATHETERIZATION    . DILATION AND CURETTAGE OF UTERUS    . TUBAL LIGATION      Current Medications: Current Meds  Medication Sig  . aspirin EC 81 MG tablet Take 81 mg by mouth daily.  . Calcium Carb-Cholecalciferol (CALCIUM 600+D3) 600-200 MG-UNIT TABS Take 1 tablet by mouth 2 (two) times daily.   . carvedilol (COREG) 25 MG tablet Take 25 mg by mouth 2 (two) times daily with a meal.  . levothyroxine (SYNTHROID, LEVOTHROID) 50 MCG tablet Take 50 mcg by mouth daily before breakfast.  . lisinopril (PRINIVIL,ZESTRIL) 40 MG tablet Take 40 mg by mouth daily.  . Omega-3 Fatty Acids (FISH OIL) 1200 MG CAPS Take 1 capsule by mouth daily.  . polyvinyl alcohol (LIQUIFILM TEARS) 1.4 % ophthalmic solution Place 1 drop into both eyes every 6 (six) hours as needed for dry eyes.  . raloxifene (EVISTA) 60 MG tablet Take 60 mg by mouth daily.   . simvastatin (ZOCOR) 20 MG tablet Take 20 mg by mouth daily.  . [DISCONTINUED] torsemide (DEMADEX) 20 MG tablet Take 1 tablet (20 mg) three times daily     Allergies:   Patient has no known allergies.   Social History   Socioeconomic History  . Marital status: Widowed    Spouse name: Not on file  . Number of children: Not on file  . Years of education: Not on file  . Highest education level: Not on file  Occupational History  . Not  on file  Tobacco Use  . Smoking status: Never Smoker  . Smokeless tobacco: Never Used  Vaping Use  . Vaping Use: Never used  Substance and Sexual Activity  . Alcohol use: Not Currently  . Drug use: Not Currently  . Sexual activity: Not on file  Other Topics Concern  . Not on file  Social History Narrative  . Not on file   Social Determinants of Health   Financial Resource Strain:   . Difficulty of Paying Living Expenses:   Food Insecurity:   . Worried About Charity fundraiser in the Last Year:   . Arboriculturist in the Last Year:   Transportation Needs:   . Film/video editor (Medical):   Marland Kitchen Lack of Transportation (Non-Medical):   Physical Activity:   . Days of Exercise per Week:   . Minutes of Exercise  per Session:   Stress:   . Feeling of Stress :   Social Connections:   . Frequency of Communication with Friends and Family:   . Frequency of Social Gatherings with Friends and Family:   . Attends Religious Services:   . Active Member of Clubs or Organizations:   . Attends Archivist Meetings:   Marland Kitchen Marital Status:      Family History: The patient's family history includes Breast cancer in her mother; Colon cancer in her son; Diabetes in her brother; Hypertension in her father; Stomach cancer in her son; Testicular cancer in her son. ROS:   Please see the history of present illness.    All other systems reviewed and are negative.  EKGs/Labs/Other Studies Reviewed:    The following studies were reviewed today:    Recent Labs: 10/31/2019: ALT 22; BUN 13; Creatinine, Ser 0.79; Hemoglobin 10.9; Magnesium 2.4; Platelets 323; Potassium 4.1; Sodium 136  Recent Lipid Panel No results found for: CHOL, TRIG, HDL, CHOLHDL, VLDL, LDLCALC, LDLDIRECT  Physical Exam:    VS:  BP 140/72 (BP Location: Left Arm, Patient Position: Sitting, Cuff Size: Normal)   Pulse 64   Ht 5\' 3"  (1.6 m)   Wt 220 lb (99.8 kg)   SpO2 93%   BMI 38.97 kg/m     Wt Readings from  Last 3 Encounters:  04/12/20 220 lb (99.8 kg)  10/28/19 216 lb (98 kg)  09/20/19 219 lb 3.2 oz (99.4 kg)     GEN: Obese BMI approaches 40 well nourished, well developed in no acute distress HEENT: Normal NECK: No JVD; No carotid bruits LYMPHATICS: No lymphadenopathy CARDIAC: Six systolic ejection murmur doesn't radiate to the carotids doesn't encompass S2 no AR no S3 S2 is paradoxical RESPIRATORY:  Clear to auscultation without rales, wheezing or rhonchi  ABDOMEN: Soft, non-tender, non-distended MUSCULOSKELETAL: She has bilateral 2-3+ pitting edema ankles to knees edema; No deformity  SKIN: Warm and dry NEUROLOGIC:  Alert and oriented x 3 PSYCHIATRIC:  Normal affect    Signed, Shirlee More, MD  04/12/2020 2:49 PM    [facility]

## 2020-04-12 ENCOUNTER — Ambulatory Visit: Payer: Medicare HMO | Admitting: Cardiology

## 2020-04-12 ENCOUNTER — Other Ambulatory Visit: Payer: Self-pay

## 2020-04-12 ENCOUNTER — Encounter: Payer: Self-pay | Admitting: Cardiology

## 2020-04-12 VITALS — BP 140/72 | HR 64 | Ht 63.0 in | Wt 220.0 lb

## 2020-04-12 DIAGNOSIS — R0602 Shortness of breath: Secondary | ICD-10-CM | POA: Diagnosis not present

## 2020-04-12 DIAGNOSIS — I350 Nonrheumatic aortic (valve) stenosis: Secondary | ICD-10-CM | POA: Diagnosis not present

## 2020-04-12 DIAGNOSIS — I110 Hypertensive heart disease with heart failure: Secondary | ICD-10-CM | POA: Diagnosis not present

## 2020-04-12 DIAGNOSIS — I342 Nonrheumatic mitral (valve) stenosis: Secondary | ICD-10-CM

## 2020-04-12 DIAGNOSIS — U071 COVID-19: Secondary | ICD-10-CM | POA: Diagnosis not present

## 2020-04-12 DIAGNOSIS — I5032 Chronic diastolic (congestive) heart failure: Secondary | ICD-10-CM

## 2020-04-12 DIAGNOSIS — I447 Left bundle-branch block, unspecified: Secondary | ICD-10-CM

## 2020-04-12 MED ORDER — TORSEMIDE 100 MG PO TABS: 50.0000 mg | ORAL_TABLET | Freq: Every day | ORAL | 3 refills | Status: DC

## 2020-04-12 NOTE — Patient Instructions (Signed)
Medication Instructions:  Your physician has recommended you make the following change in your medication:  CHANGE: Torsemide 100 mg take 0.5 tablet by mouth twice daily.   *If you need a refill on your cardiac medications before your next appointment, please call your pharmacy*   Lab Work: Your physician recommends that you return for lab work in: [place], BMP, D-dimer If you have labs (blood work) drawn today and your tests are completely normal, you will receive your results only by: Marland Kitchen MyChart Message (if you have MyChart) OR . A paper copy in the mail If you have any lab test that is abnormal or we need to change your treatment, we will call you to review the results.   Testing/Procedures: Your physician has requested that you have a lower or upper extremity venous duplex. This test is an ultrasound of the veins in the legs or arms. It looks at venous blood flow that carries blood from the heart to the legs or arms. Allow one hour for a Lower Venous exam. Allow thirty minutes for an Upper Venous exam. There are no restrictions or special instructions.   We have given you the order form to go and have a chest x-ray completed at [facility].    Follow-Up: At [facility], you and your health needs are our priority.  As part of our continuing mission to provide you with exceptional heart care, we have created designated Provider Care Teams.  These Care Teams include your primary Cardiologist (physician) and Advanced Practice Providers (APPs -  Physician Assistants and Nurse Practitioners) who all work together to provide you with the care you need, when you need it.  We recommend signing up for the patient portal called "MyChart".  Sign up information is provided on this After Visit Summary.  MyChart is used to connect with patients for Virtual Visits (Telemedicine).  Patients are able to view lab/test results, encounter notes, upcoming appointments, etc.  Non-urgent messages  can be sent to your provider as well.   To learn more about what you can do with MyChart, go to [email or web address].    Your next appointment:   3-4 week(s)  The format for your next appointment:   In Person  Provider:   Shirlee More, MD   Other Instructions

## 2020-04-13 LAB — BASIC METABOLIC PANEL
BUN/Creatinine Ratio: 14 (ref 12–28)
BUN: 14 mg/dL (ref 8–27)
CO2: 28 mmol/L (ref 20–29)
Calcium: 9.6 mg/dL (ref 8.7–10.3)
Chloride: 95 mmol/L — ABNORMAL LOW (ref 96–106)
Creatinine, Ser: 0.99 mg/dL (ref 0.57–1.00)
GFR calc Af Amer: 63 mL/min/{1.73_m2} (ref >59)
GFR calc non Af Amer: 54 mL/min/{1.73_m2} — ABNORMAL LOW (ref >59)
Glucose: 84 mg/dL (ref 65–99)
Potassium: 4.5 mmol/L (ref 3.5–5.2)
Sodium: 137 mmol/L (ref 134–144)

## 2020-04-13 LAB — PRO B NATRIURETIC PEPTIDE: NT-Pro BNP: 95 pg/mL (ref 0–738)

## 2020-04-13 LAB — D-DIMER, QUANTITATIVE: D-DIMER: 2.13 mg/L FEU — ABNORMAL HIGH (ref 0.00–0.49)

## 2020-04-17 ENCOUNTER — Telehealth: Payer: Self-pay | Admitting: Cardiology

## 2020-04-17 MED ORDER — TORSEMIDE 100 MG PO TABS: 50.0000 mg | ORAL_TABLET | Freq: Two times a day (BID) | ORAL | 3 refills | Status: DC

## 2020-04-17 NOTE — Telephone Encounter (Signed)
Patient states that Dr. Bettina Gavia changed her torsemide ([place]) 100 MG tablet at her visit with him on 7/1. She states that when [facility] brought her medicine, it says to take half a pill a day but she says that Dr. Bettina Gavia told her to take 1 pill twice a day. Please call.

## 2020-04-17 NOTE — Telephone Encounter (Signed)
Spoke with the patient just now and let her know that per her last office visit note she should be taking torsemide 50 mg per 0.5 tablet twice daily. She verbalizes understanding and thanks me for the call back.    Encouraged patient to call back with any questions or concerns.

## 2020-04-19 DIAGNOSIS — Z86000 Personal history of in-situ neoplasm of breast: Secondary | ICD-10-CM | POA: Diagnosis not present

## 2020-04-19 DIAGNOSIS — R600 Localized edema: Secondary | ICD-10-CM | POA: Diagnosis not present

## 2020-04-19 DIAGNOSIS — Z79811 Long term (current) use of aromatase inhibitors: Secondary | ICD-10-CM | POA: Diagnosis not present

## 2020-04-19 DIAGNOSIS — R635 Abnormal weight gain: Secondary | ICD-10-CM | POA: Diagnosis not present

## 2020-04-20 ENCOUNTER — Encounter: Payer: Self-pay | Admitting: Cardiology

## 2020-04-20 DIAGNOSIS — I517 Cardiomegaly: Secondary | ICD-10-CM | POA: Diagnosis not present

## 2020-04-20 DIAGNOSIS — I82402 Acute embolism and thrombosis of unspecified deep veins of left lower extremity: Secondary | ICD-10-CM | POA: Diagnosis not present

## 2020-04-20 DIAGNOSIS — C50919 Malignant neoplasm of unspecified site of unspecified female breast: Secondary | ICD-10-CM | POA: Diagnosis not present

## 2020-04-20 DIAGNOSIS — R600 Localized edema: Secondary | ICD-10-CM | POA: Diagnosis not present

## 2020-04-20 DIAGNOSIS — R0602 Shortness of breath: Secondary | ICD-10-CM | POA: Diagnosis not present

## 2020-04-26 ENCOUNTER — Telehealth: Payer: Self-pay

## 2020-04-26 NOTE — Telephone Encounter (Signed)
Left message on patients voicemail to please return our call.   

## 2020-04-26 NOTE — Telephone Encounter (Signed)
Patient returned call to [facility]. Call successfully transferred.

## 2020-04-26 NOTE — Telephone Encounter (Signed)
-----   Message from Richardo Priest, MD sent at 04/26/2020  1:42 PM EDT ----- Her chest x-ray at [facility] is good pneumonia cleared  Her lower extremity venous duplex was also normal no DVT

## 2020-04-26 NOTE — Telephone Encounter (Signed)
Spoke with patient regarding results and recommendation.  Patient verbalizes understanding and is agreeable to plan of care. Advised patient to call back with any issues or concerns.  

## 2020-05-01 DIAGNOSIS — R0600 Dyspnea, unspecified: Secondary | ICD-10-CM | POA: Diagnosis not present

## 2020-05-01 DIAGNOSIS — I5032 Chronic diastolic (congestive) heart failure: Secondary | ICD-10-CM | POA: Diagnosis not present

## 2020-05-01 DIAGNOSIS — I10 Essential (primary) hypertension: Secondary | ICD-10-CM | POA: Diagnosis not present

## 2020-05-01 DIAGNOSIS — R0902 Hypoxemia: Secondary | ICD-10-CM | POA: Diagnosis not present

## 2020-05-01 DIAGNOSIS — Z6841 Body Mass Index (BMI) 40.0 and over, adult: Secondary | ICD-10-CM | POA: Diagnosis not present

## 2020-05-01 DIAGNOSIS — U071 COVID-19: Secondary | ICD-10-CM | POA: Diagnosis not present

## 2020-05-06 DIAGNOSIS — R0902 Hypoxemia: Secondary | ICD-10-CM | POA: Diagnosis not present

## 2020-05-06 DIAGNOSIS — G4734 Idiopathic sleep related nonobstructive alveolar hypoventilation: Secondary | ICD-10-CM | POA: Diagnosis not present

## 2020-05-06 DIAGNOSIS — I5032 Chronic diastolic (congestive) heart failure: Secondary | ICD-10-CM | POA: Diagnosis not present

## 2020-05-07 NOTE — Progress Notes (Signed)
Cardiology Office Note:    Date:  05/08/2020   ID:  Nicole Baldwin, DOB [birth year], MRN [id number]  PCP:  Lowella Dandy, NP  Cardiologist:  Shirlee More, MD    Referring MD: Lowella Dandy, NP    ASSESSMENT:    1. Chronic diastolic heart failure ([place])   2. Hypertensive heart disease with heart failure ([place])   3. COVID-19 virus infection   4. Aortic stenosis, mild   5. Nonrheumatic mitral valve stenosis   6. Muscle weakness    PLAN:    In order of problems listed above:  1. Although her weight is unchanged she is resolved her edema is less short of breath and I continue her current loop diuretic.  She is hesitant for further evaluation at this time. 2. BP at target without orthostatic shift continue current treatment including her diuretic beta-blocker ACE inhibitor 3. Improved clearing of infiltrates on chest x-ray 4. Stable not severe 5. Stable most recent echocardiogram reviewed and she has mild stenosis with a mean gradient of 4 mm and a valve area of 2.1 cm 6. Discontinue her statin she may have myopathy   Next appointment: 3 months   Medication Adjustments/Labs and Tests Ordered: Current medicines are reviewed at length with the patient today.  Concerns regarding medicines are outlined above.  Orders Placed This Encounter  Procedures  . Basic Metabolic Panel (BMET)   No orders of the defined types were placed in this encounter.   Chief Complaint  Patient presents with  . Follow-up  . Congestive Heart Failure    History of Present Illness:    Nicole Baldwin is a 79 y.o. female with a hx of hypertension heart failure left bundle branch block mild aortic and mitral stenosis last seen 04/12/2020 with decompensated heart failure.  proBNP level was low not indicating heart failure D-dimer wass severely elevated and  I advised duplex lower extremity venous and chest x-ray both performed at [facility].  She had no evidence of deep vein thrombosis  and her chest x-ray showed resolution of her pneumonia..  She had COVID-19 pneumonia in January 2021 chest x-ray at that time showed multifocal infiltrates. Compliance with diet, lifestyle and medications: Yes  Despite higher dose diuretic her weight has not changed although she has Kirshenbaum or no edema on physical examination she is less short of breath she is walking outdoors every day and her predominant complaint is lower extremity muscle weakness she has no drop in blood pressure when she stands she does take a statin may have acquired myopathy and I will ask her to discontinue.  With higher dose diuretic we will recheck electrolytes in the office today.  No cough wheezing or chest pain   Ref Range & Units 3 wk ago  D-DIMER 0.00 - 0.49 mg/L FEU 2.13    Ref Range & Units 3 wk ago 1 yr ago 2 yr ago  NT-Pro BNP 0 - 738 pg/mL 95  166 CM  136   Echo 10/11/2020: Shows normal ejection fraction mild LVH elevated filling pressure mild mitral regurgitation and mild aortic valve stenosis. 1. Left ventricular ejection fraction, by visual estimation, is 55 to  60%. The left ventricle has normal function. Left ventricular septal wall  thickness was mildly increased. Mildly increased left ventricular  posterior wall thickness. There is mildly  increased left ventricular hypertrophy.  2. Elevated left ventricular end-diastolic pressure.  3. Left ventricular diastolic parameters are consistent with Grade I  diastolic dysfunction (impaired relaxation).  4. The left ventricle has no regional wall motion abnormalities.  5. Global right ventricle has normal systolic function.The right  ventricular size is normal. No increase in right ventricular wall  thickness.  6. Left atrial size was normal.  7. Right atrial size was normal.  8. Mild mitral annular calcification.  9. The mitral valve is degenerative. Mild mitral valve regurgitation. No  evidence of mitral stenosis.  10. The tricuspid valve is  normal in structure. Tricuspid valve  regurgitation is mild.  11. The aortic valve is tricuspid. Aortic valve regurgitation is not  visualized. Mild aortic valve stenosis.  12. The pulmonic valve was normal in structure. Pulmonic valve  regurgitation is not visualized.  13. 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 Abdominal distention   . Abnormal echocardiogram 02/03/2018  . Abnormal weight gain   . Acute bronchitis   . Adult BMI 37.0-37.9 kg/sq m   . Anxiety   . At moderate risk for fall   . Bilateral edema of lower extremity 02/03/2018  . Body mass index 40.0-44.9, adult ([place])   . Chest pain   . Chronic diastolic heart failure ([place])   . Depression 02/03/2018  . 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 Stage 0 ([place], N0, M0)  . Dyslipidemia 02/03/2018  . Dyspnea on effort   . Essential hypertension 02/03/2018  . Fatigue 02/03/2018  . General weakness   . Generalized obesity   . Hematoma of lower extremity, left, initial encounter   . Hyperglycemia 02/03/2018  . Hyperlipidemia   . Hypertensive heart disease with heart failure ([place]) 02/11/2018  . Hypothyroidism 02/03/2018  . Hypoxemia   . Insomnia   . Left bundle branch block 04/06/2019  . Malaise and fatigue   . 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 Mitral regurgitation 04/06/2019  . Mitral stenosis 10/12/2018  . Nocturnal hypoxia   . Otitis media, acute serous   . 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 Pneumonia due to COVID-19 virus 10/28/2019  . Postoperative examination 09/21/2019  . Trigeminal neuralgia 02/03/2018  . Vitamin D deficiency     Past Surgical History:  Procedure Laterality Date  . BREAST LUMPECTOMY Right 07/2019  . CARDIAC CATHETERIZATION    . DILATION AND CURETTAGE OF UTERUS    . TUBAL LIGATION      Current Medications: Current Meds    Medication Sig  . aspirin EC 81 MG tablet Take 81 mg by mouth daily.  . Calcium Carb-Cholecalciferol (CALCIUM 600+D3) 600-200 MG-UNIT TABS Take 1 tablet by mouth 2 (two) times daily.   . carvedilol (COREG) 25 MG tablet Take 25 mg by mouth 2 (two) times daily with a meal.  . levothyroxine (SYNTHROID, LEVOTHROID) 50 MCG tablet Take 50 mcg by mouth daily before breakfast.  . lisinopril (PRINIVIL,ZESTRIL) 40 MG tablet Take 40 mg by mouth daily.  . Omega-3 Fatty Acids (FISH OIL) 1200 MG CAPS Take 1 capsule by mouth daily.  . polyvinyl alcohol (LIQUIFILM TEARS) 1.4 % ophthalmic solution Place 1 drop into both eyes every 6 (six) hours as needed for dry eyes.  . raloxifene (EVISTA) 60 MG tablet Take 60 mg by mouth daily.   Marland Kitchen torsemide (DEMADEX) 100 MG tablet Take 0.5 tablets (50 mg total) by mouth 2 (two) times daily.  . [DISCONTINUED] simvastatin (ZOCOR) 20  MG tablet Take 20 mg by mouth daily.     Allergies:   Patient has no known allergies.   Social History   Socioeconomic History  . Marital status: Widowed    Spouse name: Not on file  . Number of children: Not on file  . Years of education: Not on file  . Highest education level: Not on file  Occupational History  . Not on file  Tobacco Use  . Smoking status: Never Smoker  . Smokeless tobacco: Never Used  Vaping Use  . Vaping Use: Never used  Substance and Sexual Activity  . Alcohol use: Not Currently  . Drug use: Not Currently  . Sexual activity: Not on file  Other Topics Concern  . Not on file  Social History Narrative  . Not on file   Social Determinants of Health   Financial Resource Strain:   . Difficulty of Paying Living Expenses:   Food Insecurity:   . Worried About Charity fundraiser in the Last Year:   . Arboriculturist in the Last Year:   Transportation Needs:   . Film/video editor (Medical):   Marland Kitchen Lack of Transportation (Non-Medical):   Physical Activity:   . Days of Exercise per Week:   . Minutes of  Exercise per Session:   Stress:   . Feeling of Stress :   Social Connections:   . Frequency of Communication with Friends and Family:   . Frequency of Social Gatherings with Friends and Family:   . Attends Religious Services:   . Active Member of Clubs or Organizations:   . Attends Archivist Meetings:   Marland Kitchen Marital Status:      Family History: The patient's family history includes Breast cancer in her mother; Colon cancer in her son; Diabetes in her brother; Hypertension in her father; Stomach cancer in her son; Testicular cancer in her son. ROS:   Please see the history of present illness.    All other systems reviewed and are negative.  EKGs/Labs/Other Studies Reviewed:    The following studies were reviewed today:    Recent Labs: 10/31/2019: ALT 22; Hemoglobin 10.9; Magnesium 2.4; Platelets 323 04/12/2020: BUN 14; Creatinine, Ser 0.99; NT-Pro BNP 95; Potassium 4.5; Sodium 137  Recent Lipid Panel No results found for: CHOL, TRIG, HDL, CHOLHDL, VLDL, LDLCALC, LDLDIRECT  Physical Exam:    VS:  BP (!) 118/62 (BP Location: Left Arm, Patient Position: Sitting, Cuff Size: Normal)   Pulse 62   Ht 5\' 3"  (1.6 m)   Wt (!) 222 lb 12.8 oz (101.1 kg)   SpO2 93%   BMI 39.47 kg/m     Wt Readings from Last 3 Encounters:  05/08/20 (!) 222 lb 12.8 oz (101.1 kg)  04/12/20 220 lb (99.8 kg)  10/28/19 216 lb (98 kg)     GEN:  Well nourished, well developed in no acute distress HEENT: Normal NECK: No JVD; No carotid bruits LYMPHATICS: No lymphadenopathy CARDIAC: RRR, no murmurs, rubs, gallops RESPIRATORY:  Clear to auscultation without rales, wheezing or rhonchi  ABDOMEN: Soft, non-tender, non-distended MUSCULOSKELETAL:  No edema; No deformity  SKIN: Warm and dry NEUROLOGIC:  Alert and oriented x 3 PSYCHIATRIC:  Normal affect    Signed, Shirlee More, MD  05/08/2020 4:06 PM    [facility]

## 2020-05-08 ENCOUNTER — Other Ambulatory Visit: Payer: Self-pay

## 2020-05-08 ENCOUNTER — Encounter: Payer: Self-pay | Admitting: Cardiology

## 2020-05-08 ENCOUNTER — Ambulatory Visit: Payer: Medicare HMO | Admitting: Cardiology

## 2020-05-08 VITALS — BP 118/62 | HR 62 | Ht 63.0 in | Wt 222.8 lb

## 2020-05-08 DIAGNOSIS — I342 Nonrheumatic mitral (valve) stenosis: Secondary | ICD-10-CM | POA: Diagnosis not present

## 2020-05-08 DIAGNOSIS — I110 Hypertensive heart disease with heart failure: Secondary | ICD-10-CM | POA: Diagnosis not present

## 2020-05-08 DIAGNOSIS — M6281 Muscle weakness (generalized): Secondary | ICD-10-CM

## 2020-05-08 DIAGNOSIS — I5032 Chronic diastolic (congestive) heart failure: Secondary | ICD-10-CM

## 2020-05-08 DIAGNOSIS — U071 COVID-19: Secondary | ICD-10-CM | POA: Diagnosis not present

## 2020-05-08 DIAGNOSIS — I350 Nonrheumatic aortic (valve) stenosis: Secondary | ICD-10-CM

## 2020-05-08 NOTE — Patient Instructions (Signed)
Medication Instructions:  Your physician has recommended you make the following change in your medication:  STOP: Simvastatin *If you need a refill on your cardiac medications before your next appointment, please call your pharmacy*   Lab Work: Your physician recommends that you return for lab work in: TODAY BMP If you have labs (blood work) drawn today and your tests are completely normal, you will receive your results only by: Marland Kitchen MyChart Message (if you have MyChart) OR . A paper copy in the mail If you have any lab test that is abnormal or we need to change your treatment, we will call you to review the results.   Testing/Procedures: nONE   Follow-Up: At [facility], you and your health needs are our priority.  As part of our continuing mission to provide you with exceptional heart care, we have created designated Provider Care Teams.  These Care Teams include your primary Cardiologist (physician) and Advanced Practice Providers (APPs -  Physician Assistants and Nurse Practitioners) who all work together to provide you with the care you need, when you need it.  We recommend signing up for the patient portal called "MyChart".  Sign up information is provided on this After Visit Summary.  MyChart is used to connect with patients for Virtual Visits (Telemedicine).  Patients are able to view lab/test results, encounter notes, upcoming appointments, etc.  Non-urgent messages can be sent to your provider as well.   To learn more about what you can do with MyChart, go to [email or web address].    Your next appointment:   3 month(s)  The format for your next appointment:   In Person  Provider:   Shirlee More, MD   Other Instructions

## 2020-05-09 LAB — BASIC METABOLIC PANEL
BUN/Creatinine Ratio: 16 (ref 12–28)
BUN: 15 mg/dL (ref 8–27)
CO2: 30 mmol/L — ABNORMAL HIGH (ref 20–29)
Calcium: 9.2 mg/dL (ref 8.7–10.3)
Chloride: 98 mmol/L (ref 96–106)
Creatinine, Ser: 0.96 mg/dL (ref 0.57–1.00)
GFR calc Af Amer: 65 mL/min/{1.73_m2} (ref >59)
GFR calc non Af Amer: 56 mL/min/{1.73_m2} — ABNORMAL LOW (ref >59)
Glucose: 93 mg/dL (ref 65–99)
Potassium: 4 mmol/L (ref 3.5–5.2)
Sodium: 141 mmol/L (ref 134–144)

## 2020-05-17 ENCOUNTER — Ambulatory Visit: Payer: Medicare HMO | Admitting: Cardiology

## 2020-05-17 ENCOUNTER — Telehealth: Payer: Self-pay

## 2020-05-17 NOTE — Telephone Encounter (Signed)
Clarification needed for patients Rx Torsemide 100 mg tablet.  Instructions faxed back to [facility] [phone] .  Torsemide 100 mg tablet, take 1/2 tablet (50 mg) twice a day.

## 2020-06-06 DIAGNOSIS — G4734 Idiopathic sleep related nonobstructive alveolar hypoventilation: Secondary | ICD-10-CM | POA: Diagnosis not present

## 2020-06-06 DIAGNOSIS — R0902 Hypoxemia: Secondary | ICD-10-CM | POA: Diagnosis not present

## 2020-06-06 DIAGNOSIS — I5032 Chronic diastolic (congestive) heart failure: Secondary | ICD-10-CM | POA: Diagnosis not present

## 2020-06-08 DIAGNOSIS — D0511 Intraductal carcinoma in situ of right breast: Secondary | ICD-10-CM | POA: Diagnosis not present

## 2020-06-08 DIAGNOSIS — R928 Other abnormal and inconclusive findings on diagnostic imaging of breast: Secondary | ICD-10-CM | POA: Diagnosis not present

## 2020-06-08 DIAGNOSIS — Z853 Personal history of malignant neoplasm of breast: Secondary | ICD-10-CM | POA: Diagnosis not present

## 2020-06-08 DIAGNOSIS — M8589 Other specified disorders of bone density and structure, multiple sites: Secondary | ICD-10-CM | POA: Diagnosis not present

## 2020-06-12 DIAGNOSIS — M25561 Pain in right knee: Secondary | ICD-10-CM | POA: Diagnosis not present

## 2020-06-12 DIAGNOSIS — E669 Obesity, unspecified: Secondary | ICD-10-CM | POA: Diagnosis not present

## 2020-06-12 DIAGNOSIS — J9611 Chronic respiratory failure with hypoxia: Secondary | ICD-10-CM | POA: Diagnosis not present

## 2020-06-12 DIAGNOSIS — I10 Essential (primary) hypertension: Secondary | ICD-10-CM | POA: Diagnosis not present

## 2020-06-12 DIAGNOSIS — R262 Difficulty in walking, not elsewhere classified: Secondary | ICD-10-CM | POA: Diagnosis not present

## 2020-06-12 DIAGNOSIS — I5032 Chronic diastolic (congestive) heart failure: Secondary | ICD-10-CM | POA: Diagnosis not present

## 2020-06-12 DIAGNOSIS — M25461 Effusion, right knee: Secondary | ICD-10-CM | POA: Diagnosis not present

## 2020-06-12 DIAGNOSIS — Z9981 Dependence on supplemental oxygen: Secondary | ICD-10-CM | POA: Diagnosis not present

## 2020-06-19 ENCOUNTER — Other Ambulatory Visit: Payer: Self-pay

## 2020-06-19 ENCOUNTER — Ambulatory Visit: Payer: Medicare HMO | Admitting: Pulmonary Disease

## 2020-06-19 ENCOUNTER — Encounter: Payer: Self-pay | Admitting: Pulmonary Disease

## 2020-06-19 VITALS — BP 138/72 | HR 66 | Temp 99.4°F | Ht 61.0 in | Wt 226.0 lb

## 2020-06-19 DIAGNOSIS — M25561 Pain in right knee: Secondary | ICD-10-CM | POA: Diagnosis not present

## 2020-06-19 DIAGNOSIS — J9611 Chronic respiratory failure with hypoxia: Secondary | ICD-10-CM

## 2020-06-19 DIAGNOSIS — R0600 Dyspnea, unspecified: Secondary | ICD-10-CM

## 2020-06-19 DIAGNOSIS — R0609 Other forms of dyspnea: Secondary | ICD-10-CM

## 2020-06-19 NOTE — Progress Notes (Signed)
@Patient  ID: Nicole Baldwin, female    DOB: 1941-01-06, 79 y.o.   MRN: [id number]  Chief Complaint  Patient presents with  . Consult    SOB and Covid follow up    Referring provider: Lowella Dandy, NP  HPI:  Ms. Nicole Baldwin is a 79 year old woman with past medical history of mitral regurg, aortic stenosis, and COVID-19 infection with acute hypoxic respiratory failure 10/2019 who we are seeing in consultation at the request of Nicole Peace NP for evaluation of hypoxemia and dyspnea on exertion.  She does not symptoms really started shortly after hospitalization for COVID-19 in 10/2019.  Notes reviewed from hospitalization.  She was hypoxemic to 80s on room air so EMS was called.  She was kept in the hospital for a few days on supplemental oxygen.  Her oxygen requirement slowly improved to 1 to 2 L during the day.  She was discharged to complete oral medicines to treat COVID-19.  In interim, she has had waxing and waning of her symptoms.  She and son state that when she was about 10 to 15 pounds lighter, she came completely off oxygen both during the day and at night.  In addition, her dyspnea on exertion seemed improved.  She was walking 5 "laps" up and down the driveway at the time up to 20 laps during the day.  Over the last several months she is slowly regaining weight and is less active walking less during the day.  She checks her oxygen saturations during the day and gets as low as 90-91 with exertion without supplemental oxygen.  At night, her son is checking her oxygen and she is wearing 2 L nasal cannula to maintain saturations between 93 and 97%.  He states that oxygen saturations are better when she is propped up sitting up in bed as opposed to when she slides down and is more supine.  She sleeps with 2 pillows.  There is a chest x-ray dated 04/20/2020 with report scanned into EMR (cannot review images) with redness and improvement in bilateral airspace opacities compared to 10/2019 at time of  hospitalization.  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 Surgical history: Breast cancer resection Family history: No history of breathing issues or lung disease, breast cancer in mother, son with cancers Social history: Lives in [place], retired, never smoker,   Licensed conveyancer / Pulmonary Flowsheets:   ACT:  No flowsheet data found.  MMRC: No flowsheet data found.  Epworth:  No flowsheet data found.  Tests:   FENO:  No results found for: NITRICOXIDE  PFT: No flowsheet data found.  WALK:  No flowsheet data found.  Imaging: Chest x-ray 10/2019 with my interpretation as follows: Good inspiration, bilateral infiltrates in lower to mid lung fields  Lab Results: Personally reviewed CBC    Component Value Date/Time   WBC 7.2 10/31/2019 0340   RBC 3.71 (L) 10/31/2019 0340   HGB 10.9 (L) 10/31/2019 0340   HCT 34.5 (L) 10/31/2019 0340   PLT 323 10/31/2019 0340   MCV 93.0 10/31/2019 0340   MCH 29.4 10/31/2019 0340   MCHC 31.6 10/31/2019 0340   RDW 13.6 10/31/2019 0340   LYMPHSABS 1.3 10/31/2019 0340   MONOABS 1.0 10/31/2019 0340   EOSABS 0.0 10/31/2019 0340   BASOSABS 0.0 10/31/2019 0340    BMET    Component Value Date/Time   NA 141 05/08/2020 1608   K 4.0 05/08/2020 1608   CL 98 05/08/2020 1608   CO2  30 (H) 05/08/2020 1608   GLUCOSE 93 05/08/2020 1608   GLUCOSE 89 10/31/2019 0340   BUN 15 05/08/2020 1608   CREATININE 0.96 05/08/2020 1608   CALCIUM 9.2 05/08/2020 1608   GFRNONAA 56 (L) 05/08/2020 1608   GFRAA 65 05/08/2020 1608    BNP No results found for: BNP  ProBNP    Component Value Date/Time   PROBNP 95 04/12/2020 1507    Specialty Problems    None      No Known Allergies   There is no immunization history on file for this patient.  Past Medical History:  Diagnosis Date  . Abdominal distention   . Abnormal echocardiogram 02/03/2018  . Abnormal weight gain   . Acute bronchitis   . Adult  BMI 37.0-37.9 kg/sq m   . Anxiety   . At moderate risk for fall   . Bilateral edema of lower extremity 02/03/2018  . Body mass index 40.0-44.9, adult ([place])   . Chest pain   . Chronic diastolic heart failure ([place])   . Depression 02/03/2018  . 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 Stage 0 (Tis, N0, M0)  . Dyslipidemia 02/03/2018  . Dyspnea on effort   . Essential hypertension 02/03/2018  . Fatigue 02/03/2018  . General weakness   . Generalized obesity   . Hematoma of lower extremity, left, initial encounter   . Hyperglycemia 02/03/2018  . Hyperlipidemia   . Hypertensive heart disease with heart failure ([place]) 02/11/2018  . Hypothyroidism 02/03/2018  . Hypoxemia   . Insomnia   . Left bundle branch block 04/06/2019  . Malaise and fatigue   . 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 Mitral regurgitation 04/06/2019  . Mitral stenosis 10/12/2018  . Nocturnal hypoxia   . Otitis media, acute serous   . 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 Pneumonia due to COVID-19 virus 10/28/2019  . Postoperative examination 09/21/2019  . Trigeminal neuralgia 02/03/2018  . Vitamin D deficiency     Tobacco History: Social History   Tobacco Use  Smoking Status Never Smoker  Smokeless Tobacco Never Used   Counseling given: Not Answered    Outpatient Encounter Medications as of 06/19/2020  Medication Sig  . aspirin EC 81 MG tablet Take 81 mg by mouth daily.  . Calcium Carb-Cholecalciferol (CALCIUM 600+D3) 600-200 MG-UNIT TABS Take 1 tablet by mouth 2 (two) times daily.   . carvedilol (COREG) 25 MG tablet Take 25 mg by mouth 2 (two) times daily with a meal.  . levothyroxine (SYNTHROID, LEVOTHROID) 50 MCG tablet Take 50 mcg by mouth daily before breakfast.  . lisinopril (PRINIVIL,ZESTRIL) 40 MG tablet Take 40 mg by mouth daily.  . Omega-3 Fatty Acids (FISH OIL) 1200 MG CAPS Take 1 capsule by mouth daily.   . polyvinyl alcohol (LIQUIFILM TEARS) 1.4 % ophthalmic solution Place 1 drop into both eyes every 6 (six) hours as needed for dry eyes.  . raloxifene (EVISTA) 60 MG tablet Take 60 mg by mouth daily.   Marland Kitchen torsemide (DEMADEX) 100 MG tablet Take 0.5 tablets (50 mg total) by mouth 2 (two) times daily.   No facility-administered encounter medications on file as of 06/19/2020.     Review of Systems  Review of Systems  No chest pain with exertion, no orthopnea or PND.  No reported witnessed apneas.  Comprehensive review of systems otherwise negative.  Physical Exam  BP 138/72 (BP Location:  Left Arm, Cuff Size: Normal)   Pulse 66   Temp 99.4 F (37.4 C) (Oral)   Ht 5\' 1"  (1.549 m)   Wt 226 lb (102.5 kg)   SpO2 95%   BMI 42.70 kg/m   Wt Readings from Last 5 Encounters:  06/19/20 226 lb (102.5 kg)  05/08/20 (!) 222 lb 12.8 oz (101.1 kg)  04/12/20 220 lb (99.8 kg)  10/28/19 216 lb (98 kg)  09/20/19 219 lb 3.2 oz (99.4 kg)    BMI Readings from Last 5 Encounters:  06/19/20 42.70 kg/m  05/08/20 39.47 kg/m  04/12/20 38.97 kg/m  10/28/19 38.26 kg/m  09/20/19 40.09 kg/m     Physical Exam General: Well-appearing, sitting up in exam table Eyes: EOMI, no icterus Neck: Supple, no JVP appreciated Respiratory: Clear to auscultation bilaterally, no wheezes or crackles Cardiovascular: Regular rate and rhythm, no murmurs, no to trace lower extremity edema at ankles Abdomen: Soft, bowel sounds present MSK: No synovitis, no joint effusion Neuro: Normal gait, no weakness Psych: Normal mood, full affect    Assessment & Plan:   Ms. Steinmetz is a 79 year old woman with past medical history of mitral regurg, aortic stenosis, and COVID-19 infection with acute hypoxic respiratory failure 10/2019 who we are seeing in consultation at the request of Amy Moon NP for evaluation of hypoxemia and dyspnea on exertion.  Suspect nocturnal hypoxemia and dyspnea on exertion are largely related to  obesity given she was off oxygen and symptomatically improved at a lower weight and given elevated bicarb on recent BMP suggestive of possible OHS.  Recommend weight loss with increased physical activity.  Reassured the report of most recent chest x-ray demonstrated improvement in bilateral infiltrates compared to chest x-ray during admission for Covid in January 2021.  She is never smoker.  Low suspicion for parenchymal lung disease.  Home sleep study ordered to evaluate for OSA that if present would likely benefit from positive pressure ventilation as opposed to just nocturnal oxygen via nasal cannula.  Lastly, will order PFTs to evaluate for other respiratory causes of her symptoms.   Return in about 3 months (around 09/18/2020).   Lanier Clam, MD 06/19/2020

## 2020-06-19 NOTE — Patient Instructions (Addendum)
Nice to meet you!  We will get breathing tests to see the function of your lungs in the next 2-4 weeks. I also ordered a sleep study to be done at home in the coming weeks to see if sleep apnea is there reason you need oxygen at night.   We will see you back in 3 months with Dr. Silas Flood. Please schedule PFTs in the next few weeks.

## 2020-07-05 DIAGNOSIS — Z86000 Personal history of in-situ neoplasm of breast: Secondary | ICD-10-CM | POA: Insufficient documentation

## 2020-07-07 DIAGNOSIS — G4734 Idiopathic sleep related nonobstructive alveolar hypoventilation: Secondary | ICD-10-CM | POA: Diagnosis not present

## 2020-07-07 DIAGNOSIS — R0902 Hypoxemia: Secondary | ICD-10-CM | POA: Diagnosis not present

## 2020-07-07 DIAGNOSIS — I5032 Chronic diastolic (congestive) heart failure: Secondary | ICD-10-CM | POA: Diagnosis not present

## 2020-07-10 DIAGNOSIS — H25811 Combined forms of age-related cataract, right eye: Secondary | ICD-10-CM | POA: Diagnosis not present

## 2020-07-10 DIAGNOSIS — H25812 Combined forms of age-related cataract, left eye: Secondary | ICD-10-CM | POA: Diagnosis not present

## 2020-07-10 DIAGNOSIS — H353220 Exudative age-related macular degeneration, left eye, stage unspecified: Secondary | ICD-10-CM | POA: Diagnosis not present

## 2020-07-10 DIAGNOSIS — Z01818 Encounter for other preprocedural examination: Secondary | ICD-10-CM | POA: Diagnosis not present

## 2020-07-10 DIAGNOSIS — H25813 Combined forms of age-related cataract, bilateral: Secondary | ICD-10-CM | POA: Diagnosis not present

## 2020-07-20 ENCOUNTER — Telehealth: Payer: Self-pay | Admitting: Pulmonary Disease

## 2020-07-20 DIAGNOSIS — G4733 Obstructive sleep apnea (adult) (pediatric): Secondary | ICD-10-CM

## 2020-07-20 DIAGNOSIS — C50211 Malignant neoplasm of upper-inner quadrant of right female breast: Secondary | ICD-10-CM | POA: Diagnosis not present

## 2020-07-20 NOTE — Telephone Encounter (Signed)
Order was placed for hst on 9/7.  I called pt to schedule study and she states she is on daytime and nighttime O2.  I asked pt if she uses O2 every night and she states she does.  Order needed for pt to have inlab study due to on O2 at night.

## 2020-07-23 NOTE — Telephone Encounter (Signed)
In lab sleep study ordered per request.

## 2020-07-24 DIAGNOSIS — M25561 Pain in right knee: Secondary | ICD-10-CM | POA: Diagnosis not present

## 2020-07-24 DIAGNOSIS — Z9981 Dependence on supplemental oxygen: Secondary | ICD-10-CM | POA: Diagnosis not present

## 2020-07-24 DIAGNOSIS — J9611 Chronic respiratory failure with hypoxia: Secondary | ICD-10-CM | POA: Diagnosis not present

## 2020-07-24 DIAGNOSIS — I10 Essential (primary) hypertension: Secondary | ICD-10-CM | POA: Diagnosis not present

## 2020-07-24 DIAGNOSIS — R739 Hyperglycemia, unspecified: Secondary | ICD-10-CM | POA: Diagnosis not present

## 2020-07-24 DIAGNOSIS — I5032 Chronic diastolic (congestive) heart failure: Secondary | ICD-10-CM | POA: Diagnosis not present

## 2020-07-24 DIAGNOSIS — Z6841 Body Mass Index (BMI) 40.0 and over, adult: Secondary | ICD-10-CM | POA: Diagnosis not present

## 2020-07-24 DIAGNOSIS — E785 Hyperlipidemia, unspecified: Secondary | ICD-10-CM | POA: Diagnosis not present

## 2020-07-24 DIAGNOSIS — E039 Hypothyroidism, unspecified: Secondary | ICD-10-CM | POA: Diagnosis not present

## 2020-07-26 DIAGNOSIS — H2512 Age-related nuclear cataract, left eye: Secondary | ICD-10-CM | POA: Diagnosis not present

## 2020-07-26 DIAGNOSIS — H25812 Combined forms of age-related cataract, left eye: Secondary | ICD-10-CM | POA: Diagnosis not present

## 2020-08-06 DIAGNOSIS — G4734 Idiopathic sleep related nonobstructive alveolar hypoventilation: Secondary | ICD-10-CM | POA: Diagnosis not present

## 2020-08-06 DIAGNOSIS — R0902 Hypoxemia: Secondary | ICD-10-CM | POA: Diagnosis not present

## 2020-08-06 DIAGNOSIS — I5032 Chronic diastolic (congestive) heart failure: Secondary | ICD-10-CM | POA: Diagnosis not present

## 2020-08-09 DIAGNOSIS — H25811 Combined forms of age-related cataract, right eye: Secondary | ICD-10-CM | POA: Diagnosis not present

## 2020-08-09 DIAGNOSIS — H2511 Age-related nuclear cataract, right eye: Secondary | ICD-10-CM | POA: Diagnosis not present

## 2020-08-14 ENCOUNTER — Ambulatory Visit: Payer: Medicare HMO | Admitting: Cardiology

## 2020-08-16 DIAGNOSIS — H25811 Combined forms of age-related cataract, right eye: Secondary | ICD-10-CM | POA: Diagnosis not present

## 2020-08-31 DIAGNOSIS — R635 Abnormal weight gain: Secondary | ICD-10-CM | POA: Insufficient documentation

## 2020-08-31 DIAGNOSIS — R5383 Other fatigue: Secondary | ICD-10-CM | POA: Insufficient documentation

## 2020-08-31 DIAGNOSIS — G4734 Idiopathic sleep related nonobstructive alveolar hypoventilation: Secondary | ICD-10-CM | POA: Insufficient documentation

## 2020-08-31 DIAGNOSIS — R5381 Other malaise: Secondary | ICD-10-CM | POA: Insufficient documentation

## 2020-08-31 DIAGNOSIS — E669 Obesity, unspecified: Secondary | ICD-10-CM | POA: Insufficient documentation

## 2020-08-31 DIAGNOSIS — Z6841 Body Mass Index (BMI) 40.0 and over, adult: Secondary | ICD-10-CM | POA: Insufficient documentation

## 2020-08-31 DIAGNOSIS — R079 Chest pain, unspecified: Secondary | ICD-10-CM | POA: Insufficient documentation

## 2020-08-31 DIAGNOSIS — R0609 Other forms of dyspnea: Secondary | ICD-10-CM | POA: Insufficient documentation

## 2020-08-31 DIAGNOSIS — E785 Hyperlipidemia, unspecified: Secondary | ICD-10-CM | POA: Insufficient documentation

## 2020-08-31 DIAGNOSIS — G4700 Insomnia, unspecified: Secondary | ICD-10-CM | POA: Insufficient documentation

## 2020-08-31 DIAGNOSIS — R140 Abdominal distension (gaseous): Secondary | ICD-10-CM | POA: Insufficient documentation

## 2020-08-31 DIAGNOSIS — F419 Anxiety disorder, unspecified: Secondary | ICD-10-CM | POA: Insufficient documentation

## 2020-08-31 DIAGNOSIS — S8012XA Contusion of left lower leg, initial encounter: Secondary | ICD-10-CM | POA: Insufficient documentation

## 2020-08-31 DIAGNOSIS — Z9181 History of falling: Secondary | ICD-10-CM | POA: Insufficient documentation

## 2020-08-31 DIAGNOSIS — J209 Acute bronchitis, unspecified: Secondary | ICD-10-CM | POA: Insufficient documentation

## 2020-08-31 DIAGNOSIS — Z6837 Body mass index (BMI) 37.0-37.9, adult: Secondary | ICD-10-CM | POA: Insufficient documentation

## 2020-08-31 DIAGNOSIS — R0600 Dyspnea, unspecified: Secondary | ICD-10-CM | POA: Insufficient documentation

## 2020-08-31 DIAGNOSIS — R0902 Hypoxemia: Secondary | ICD-10-CM | POA: Insufficient documentation

## 2020-08-31 DIAGNOSIS — H6500 Acute serous otitis media, unspecified ear: Secondary | ICD-10-CM | POA: Insufficient documentation

## 2020-08-31 DIAGNOSIS — R531 Weakness: Secondary | ICD-10-CM | POA: Insufficient documentation

## 2020-08-31 DIAGNOSIS — E559 Vitamin D deficiency, unspecified: Secondary | ICD-10-CM | POA: Insufficient documentation

## 2020-09-06 DIAGNOSIS — R0902 Hypoxemia: Secondary | ICD-10-CM | POA: Diagnosis not present

## 2020-09-06 DIAGNOSIS — G4734 Idiopathic sleep related nonobstructive alveolar hypoventilation: Secondary | ICD-10-CM | POA: Diagnosis not present

## 2020-09-06 DIAGNOSIS — I5032 Chronic diastolic (congestive) heart failure: Secondary | ICD-10-CM | POA: Diagnosis not present

## 2020-09-09 NOTE — Progress Notes (Signed)
Cardiology Office Note:    Date:  09/10/2020   ID:  Nicole Baldwin, DOB November 09, 1940, MRN [id number]  PCP:  Lowella Dandy, NP  Cardiologist:  Shirlee More, MD    Referring MD: Lowella Dandy, NP    ASSESSMENT:    1. Hypertensive heart disease with heart failure ([place])   2. Aortic stenosis, mild   3. Nonrheumatic mitral valve stenosis    PLAN:    In order of problems listed above:  1. Improved appears compensated continue her current diuretics and I agree that she would benefit from a sleep study. 2. Stable valvular heart disease plan a repeat echocardiogram in a few years   Next appointment: 6 months   Medication Adjustments/Labs and Tests Ordered: Current medicines are reviewed at length with the patient today.  Concerns regarding medicines are outlined above.  No orders of the defined types were placed in this encounter.  No orders of the defined types were placed in this encounter.   Chief Complaint  Patient presents with  . Follow-up  . Congestive Heart Failure    History of Present Illness:    Nicole Baldwin is a 79 y.o. female with a hx of hypertensive heart disease with chronic diastolic heart failure left bundle branch block mild aortic and mild mitral stenosis and COVID-19 pneumonia in January 2021 last seen 05/08/2020 in follow-up to a visit with decompensated heart failure.  Repeat echocardiogram confirmed mild aortic stenosis mild mitral stenosis and mild mitral regurgitation. Compliance with diet, lifestyle and medications: Yes  In general she is improved and better on present diuretic although her weight is up a few pounds at home and we set a weight limit for taking extra tablet of torsemide daily at 224 pounds.  Is not having edema orthopnea chest pain palpitation or syncope she is just short of breath with more than usual activity.  She is pending a sleep test to be done in January.  She uses nocturnal oxygen.  Most recent labs 07/24/2020  cholesterol 223 LDL 147 triglycerides 186 HDL 42 creatinine 1.08.  She plans to have lab work done in January in her PCP office Past Medical History:  Diagnosis Date  . Abdominal distention   . Abnormal echocardiogram 02/03/2018  . Abnormal weight gain   . Acute bronchitis   . Adult BMI 37.0-37.9 kg/sq m   . Anxiety   . At moderate risk for fall   . Bilateral edema of lower extremity 02/03/2018  . Body mass index 40.0-44.9, adult ([place])   . Chest pain   . Chronic diastolic heart failure ([place])   . Depression 02/03/2018  . 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 Stage 0 (Tis, N0, M0)  . Dyslipidemia 02/03/2018  . Dyspnea on effort   . Essential hypertension 02/03/2018  . Fatigue 02/03/2018  . General weakness   . Generalized obesity   . Hematoma of lower extremity, left, initial encounter   . Hyperglycemia 02/03/2018  . Hyperlipidemia   . Hypertensive heart disease with heart failure ([place]) 02/11/2018  . Hypothyroidism 02/03/2018  . Hypoxemia   . Insomnia   . Left bundle branch block 04/06/2019  . Malaise and fatigue   . 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 Mitral regurgitation 04/06/2019  . Mitral stenosis 10/12/2018  . Nocturnal hypoxia   . Otitis media, acute serous   . Papillary carcinoma in situ of right  breast 07/07/2019  . Pneumonia due to COVID-19 virus 10/28/2019  . Postoperative examination 09/21/2019  . Trigeminal neuralgia 02/03/2018  . Vitamin D deficiency     Past Surgical History:  Procedure Laterality Date  . BREAST LUMPECTOMY Right 07/2019  . CARDIAC CATHETERIZATION    . DILATION AND CURETTAGE OF UTERUS    . TUBAL LIGATION      Current Medications: Current Meds  Medication Sig  . aspirin EC 81 MG tablet Take 81 mg by mouth daily.  . Calcium Carb-Cholecalciferol (CALCIUM 600+D3) 600-200 MG-UNIT TABS Take 1 tablet by mouth 2 (two) times daily.   . carvedilol (COREG) 25 MG tablet  Take 25 mg by mouth 2 (two) times daily with a meal.  . levothyroxine (SYNTHROID, LEVOTHROID) 50 MCG tablet Take 50 mcg by mouth daily before breakfast.  . lisinopril (PRINIVIL,ZESTRIL) 40 MG tablet Take 40 mg by mouth daily.  . Omega-3 Fatty Acids (FISH OIL) 1200 MG CAPS Take 1 capsule by mouth daily.  . polyvinyl alcohol (LIQUIFILM TEARS) 1.4 % ophthalmic solution Place 1 drop into both eyes every 6 (six) hours as needed for dry eyes.  . raloxifene (EVISTA) 60 MG tablet Take 60 mg by mouth daily.   . simvastatin (ZOCOR) 20 MG tablet Take 20 mg by mouth daily.  Marland Kitchen torsemide (DEMADEX) 100 MG tablet Take 0.5 tablets (50 mg total) by mouth 2 (two) times daily.     Allergies:   Patient has no known allergies.   Social History   Socioeconomic History  . Marital status: Widowed    Spouse name: Not on file  . Number of children: Not on file  . Years of education: Not on file  . Highest education level: Not on file  Occupational History  . Not on file  Tobacco Use  . Smoking status: Never Smoker  . Smokeless tobacco: Never Used  Vaping Use  . Vaping Use: Never used  Substance and Sexual Activity  . Alcohol use: Not Currently  . Drug use: Not Currently  . Sexual activity: Not on file  Other Topics Concern  . Not on file  Social History Narrative  . Not on file   Social Determinants of Health   Financial Resource Strain:   . Difficulty of Paying Living Expenses: Not on file  Food Insecurity:   . Worried About Charity fundraiser in the Last Year: Not on file  . Ran Out of Food in the Last Year: Not on file  Transportation Needs:   . Lack of Transportation (Medical): Not on file  . Lack of Transportation (Non-Medical): Not on file  Physical Activity:   . Days of Exercise per Week: Not on file  . Minutes of Exercise per Session: Not on file  Stress:   . Feeling of Stress : Not on file  Social Connections:   . Frequency of Communication with Friends and Family: Not on file  .  Frequency of Social Gatherings with Friends and Family: Not on file  . Attends Religious Services: Not on file  . Active Member of Clubs or Organizations: Not on file  . Attends Archivist Meetings: Not on file  . Marital Status: Not on file     Family History: The patient's family history includes Breast cancer in her mother; Colon cancer in her son; Diabetes in her brother; Hypertension in her father; Stomach cancer in her son; Testicular cancer in her son. ROS:   Please see the history of present illness.  All other systems reviewed and are negative.  EKGs/Labs/Other Studies Reviewed:    The following studies were reviewed today:   Recent Labs: 10/31/2019: ALT 22; Hemoglobin 10.9; Magnesium 2.4; Platelets 323 04/12/2020: NT-Pro BNP 95 05/08/2020: BUN 15; Creatinine, Ser 0.96; Potassium 4.0; Sodium 141    Physical Exam:    VS:  BP 133/85   Pulse 68   Ht 5\' 1"  (1.549 m)   Wt 224 lb 9.6 oz (101.9 kg)   SpO2 95%   BMI 42.44 kg/m     Wt Readings from Last 3 Encounters:  09/10/20 224 lb 9.6 oz (101.9 kg)  06/19/20 226 lb (102.5 kg)  05/08/20 (!) 222 lb 12.8 oz (101.1 kg)     GEN: Obese certainly has a sleep apnea parents well nourished, well developed in no acute distress HEENT: Normal NECK: No JVD; No carotid bruits LYMPHATICS: No lymphadenopathy CARDIAC: Distant RRR, no murmurs, rubs, gallops RESPIRATORY:  Clear to auscultation without rales, wheezing or rhonchi  ABDOMEN: Soft, non-tender, non-distended MUSCULOSKELETAL:  No edema; No deformity  SKIN: Warm and dry NEUROLOGIC:  Alert and oriented x 3 PSYCHIATRIC:  Normal affect    Signed, Shirlee More, MD  09/10/2020 3:31 PM    [facility]

## 2020-09-10 ENCOUNTER — Other Ambulatory Visit: Payer: Self-pay

## 2020-09-10 ENCOUNTER — Encounter: Payer: Self-pay | Admitting: Cardiology

## 2020-09-10 ENCOUNTER — Ambulatory Visit: Payer: Medicare HMO | Admitting: Cardiology

## 2020-09-10 ENCOUNTER — Encounter (HOSPITAL_BASED_OUTPATIENT_CLINIC_OR_DEPARTMENT_OTHER): Payer: Medicare HMO | Admitting: Pulmonary Disease

## 2020-09-10 VITALS — BP 133/85 | HR 68 | Ht 61.0 in | Wt 224.6 lb

## 2020-09-10 DIAGNOSIS — I110 Hypertensive heart disease with heart failure: Secondary | ICD-10-CM

## 2020-09-10 DIAGNOSIS — I350 Nonrheumatic aortic (valve) stenosis: Secondary | ICD-10-CM | POA: Diagnosis not present

## 2020-09-10 DIAGNOSIS — I342 Nonrheumatic mitral (valve) stenosis: Secondary | ICD-10-CM

## 2020-09-10 NOTE — Patient Instructions (Signed)
Medication Instructions:  Your physician recommends that you continue on your current medications as directed. Please refer to the Current Medication list given to you today.  If your weight is above 224 lbs take an extra tablet of your torsemide daily  *If you need a refill on your cardiac medications before your next appointment, please call your pharmacy*   Lab Work: None If you have labs (blood work) drawn today and your tests are completely normal, you will receive your results only by: Marland Kitchen MyChart Message (if you have MyChart) OR . A paper copy in the mail If you have any lab test that is abnormal or we need to change your treatment, we will call you to review the results.   Testing/Procedures: None   Follow-Up: At [facility], you and your health needs are our priority.  As part of our continuing mission to provide you with exceptional heart care, we have created designated Provider Care Teams.  These Care Teams include your primary Cardiologist (physician) and Advanced Practice Providers (APPs -  Physician Assistants and Nurse Practitioners) who all work together to provide you with the care you need, when you need it.  We recommend signing up for the patient portal called "MyChart".  Sign up information is provided on this After Visit Summary.  MyChart is used to connect with patients for Virtual Visits (Telemedicine).  Patients are able to view lab/test results, encounter notes, upcoming appointments, etc.  Non-urgent messages can be sent to your provider as well.   To learn more about what you can do with MyChart, go to [email or web address].    Your next appointment:   6 month(s)  The format for your next appointment:   In Person  Provider:   Shirlee More, MD   Other Instructions

## 2020-09-19 DIAGNOSIS — Z0100 Encounter for examination of eyes and vision without abnormal findings: Secondary | ICD-10-CM | POA: Diagnosis not present

## 2020-10-06 DIAGNOSIS — G4734 Idiopathic sleep related nonobstructive alveolar hypoventilation: Secondary | ICD-10-CM | POA: Diagnosis not present

## 2020-10-06 DIAGNOSIS — I5032 Chronic diastolic (congestive) heart failure: Secondary | ICD-10-CM | POA: Diagnosis not present

## 2020-10-06 DIAGNOSIS — R0902 Hypoxemia: Secondary | ICD-10-CM | POA: Diagnosis not present

## 2020-10-23 ENCOUNTER — Encounter (HOSPITAL_BASED_OUTPATIENT_CLINIC_OR_DEPARTMENT_OTHER): Payer: Medicare HMO | Admitting: Pulmonary Disease

## 2020-10-23 DIAGNOSIS — Z6841 Body Mass Index (BMI) 40.0 and over, adult: Secondary | ICD-10-CM | POA: Diagnosis not present

## 2020-10-23 DIAGNOSIS — I10 Essential (primary) hypertension: Secondary | ICD-10-CM | POA: Diagnosis not present

## 2020-10-23 DIAGNOSIS — I5032 Chronic diastolic (congestive) heart failure: Secondary | ICD-10-CM | POA: Diagnosis not present

## 2020-10-23 DIAGNOSIS — M25561 Pain in right knee: Secondary | ICD-10-CM | POA: Diagnosis not present

## 2020-10-23 DIAGNOSIS — Z9981 Dependence on supplemental oxygen: Secondary | ICD-10-CM | POA: Diagnosis not present

## 2020-10-23 DIAGNOSIS — J9611 Chronic respiratory failure with hypoxia: Secondary | ICD-10-CM | POA: Diagnosis not present

## 2020-10-23 DIAGNOSIS — R739 Hyperglycemia, unspecified: Secondary | ICD-10-CM | POA: Diagnosis not present

## 2020-10-23 DIAGNOSIS — E039 Hypothyroidism, unspecified: Secondary | ICD-10-CM | POA: Diagnosis not present

## 2020-10-23 DIAGNOSIS — E785 Hyperlipidemia, unspecified: Secondary | ICD-10-CM | POA: Diagnosis not present

## 2020-11-06 DIAGNOSIS — R0902 Hypoxemia: Secondary | ICD-10-CM | POA: Diagnosis not present

## 2020-11-06 DIAGNOSIS — I5032 Chronic diastolic (congestive) heart failure: Secondary | ICD-10-CM | POA: Diagnosis not present

## 2020-11-06 DIAGNOSIS — G4734 Idiopathic sleep related nonobstructive alveolar hypoventilation: Secondary | ICD-10-CM | POA: Diagnosis not present

## 2020-11-12 ENCOUNTER — Other Ambulatory Visit: Payer: Self-pay | Admitting: Oncology

## 2020-11-19 ENCOUNTER — Other Ambulatory Visit: Payer: Self-pay

## 2020-11-19 ENCOUNTER — Encounter: Payer: Self-pay | Admitting: Hematology and Oncology

## 2020-11-19 ENCOUNTER — Inpatient Hospital Stay: Payer: Medicare HMO | Attending: Hematology and Oncology | Admitting: Hematology and Oncology

## 2020-11-19 VITALS — BP 144/67 | HR 66 | Temp 98.9°F | Resp 18 | Ht 61.0 in | Wt 233.2 lb

## 2020-11-19 DIAGNOSIS — D0511 Intraductal carcinoma in situ of right breast: Secondary | ICD-10-CM

## 2020-11-19 NOTE — Progress Notes (Incomplete)
[facility]  [address] 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  Annual screening mammogram in August 2020 revealed a suspicious possible mass of the right breast.  Diagnostic right mammogram and ultrasound confirmed a hypoechoic oval mass with indistinct margins measuring 5 x 4 x 4 mm at 1 o'clock in the right breast, 6 cm from the nipple.  Ultrasound guided biopsy in September revealed this to be a papillary carcinoma, at least in situ.  Estrogen receptor was 100% positive, and progesterone receptor was 70% positive.  She underwent lumpectomy in October.  Pathology revealed 0.9 cm, intermediate grade, ductal carcinoma in situ with negative margin.  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  She was placed on chemoprevention with raloxifene 60 mg daily in November 2020 and is planned for 5 years.   She contracted COVID in January 2021 and was hospitalized for 4 days.  She was sent home on oxygen, but was able to be weaned off of this.  Annual bilateral mammogram from August did not reveal any evidence of malignancy.  Bone density scan revealed worsening osteopenia with a T-score of -1.9 of the left femur neck, previously -1.1, a T-score of -0.3 int the total femur, which is normal, and a T-score of -1.2 in the spine. which was stable.  She is taking calcium 600 mg and vitamin D3 1000 international units daily.  INTERVAL HISTORY:  Nicole Baldwin is here today for repeat  clinical assessment and states she continues raloxifene daily without significant difficulty. She denies any changes in her breasts.  She is concerned as she has gained weight since her last visit.She states she is back on oxygen at night.  She also hurt her left knee, well trying to remove a sock about 3 weeks ago.  The pain seems to be improving with ice and Arthritis Strength Tylenol.  She is able to ambulate without significant difficulty. She denies fevers or chills. Her appetite is good. Her weight has increased 8 pounds over last 4 months.  She asks about something for weight loss. She is on levothyroxine for hypothyroidism and has her thyroid checked regularly, in addition to her other labs, at [facility] office She is not up-to-date on screening colonoscopy.  Her son has history of colon cancer.  REVIEW OF SYSTEMS:  Review of Systems  Constitutional: Negative for appetite change, chills, fatigue, fever and unexpected weight change.  HENT:   Negative for lump/mass, mouth sores and sore throat.   Respiratory: Negative for cough and shortness of breath.   Cardiovascular: Negative for chest pain and leg swelling.  Gastrointestinal: Negative for abdominal pain, constipation, diarrhea, nausea and vomiting.  Genitourinary: Negative for difficulty urinating, dysuria, frequency and hematuria.   Musculoskeletal: Positive for arthralgias (left knee pain after injury). Negative for back pain and myalgias.  Skin: Negative for rash.  Neurological: Negative for dizziness and headaches.  Psychiatric/Behavioral: Negative for depression and sleep disturbance. The patient is not nervous/anxious.  VITALS:  Blood pressure (!) 144/67, pulse 66, temperature 98.9 F (37.2 C), temperature source Oral, resp. rate 18, height 5\' 1"  (1.549 m), weight 233 lb 3.2 oz (105.8 kg), SpO2 94 %.  Wt Readings from Last 3 Encounters:  11/19/20 233 lb 3.2 oz (105.8 kg)  09/10/20 224 lb 9.6 oz (101.9 kg)  06/19/20 226 lb  (102.5 kg)    Body mass index is 44.06 kg/m.  Performance status (ECOG): 1 - Symptomatic but completely ambulatory  PHYSICAL EXAM:  Physical Exam Vitals and nursing note reviewed.  Constitutional:      General: She is not in acute distress.    Appearance: Normal appearance.  HENT:     Mouth/Throat:     Mouth: Mucous membranes are moist.     Pharynx: Oropharynx is clear. No oropharyngeal exudate or posterior oropharyngeal erythema.  Eyes:     General: No scleral icterus.    Extraocular Movements: Extraocular movements intact.     Conjunctiva/sclera: Conjunctivae normal.     Pupils: Pupils are equal, round, and reactive to light.  Cardiovascular:     Rate and Rhythm: Normal rate and regular rhythm.     Heart sounds: Heart sounds are distant. No murmur heard. No friction rub. No gallop.   Pulmonary:     Effort: Pulmonary effort is normal. No respiratory distress.     Breath sounds: Normal breath sounds. No stridor. No wheezing, rhonchi or rales.  Chest:  Breasts:     Right: Inverted nipple (since surgery) present. No swelling, bleeding, mass, nipple discharge, skin change, tenderness, axillary adenopathy or supraclavicular adenopathy.     Left: Normal. No swelling, bleeding, inverted nipple, mass, nipple discharge, skin change, tenderness, axillary adenopathy or supraclavicular adenopathy.    Abdominal:     General: Abdomen is protuberant. There is no distension.     Palpations: Abdomen is soft. There is no hepatomegaly, splenomegaly or mass.     Tenderness: There is no abdominal tenderness. There is no guarding.     Hernia: No hernia is present.  Musculoskeletal:        General: Normal range of motion.     Cervical back: Normal range of motion. No tenderness.     Left knee: Normal. No swelling, deformity, effusion, bony tenderness or crepitus. Normal range of motion. No tenderness. No LCL laxity, MCL laxity, ACL laxity or PCL laxity.    Right lower leg: No edema.     Left  lower leg: No edema.  Lymphadenopathy:     Cervical: No cervical adenopathy.     Upper Body:     Right upper body: No supraclavicular or axillary adenopathy.     Left upper body: No supraclavicular or axillary adenopathy.     Lower Body: No right inguinal adenopathy. No left inguinal adenopathy.  Skin:    General: Skin is warm and dry.     Coloration: Skin is not jaundiced.     Findings: No rash.  Neurological:     Mental Status: She is alert and oriented to person, place, and time.     Cranial Nerves: No cranial nerve deficit.  Psychiatric:        Mood and Affect: Mood normal.        Behavior: Behavior normal.        Thought Content: Thought content normal.     LABS:   CBC Latest Ref Rng & Units 10/31/2019 10/30/2019 10/29/2019  WBC 4.0 - 10.5 K/uL 7.2 6.7 5.4  Hemoglobin 12.0 - 15.0  g/dL 10.9(L) 11.1(L) 11.3(L)  Hematocrit 36.0 - 46.0 % 34.5(L) 34.6(L) 35.0(L)  Platelets 150 - 400 K/uL 323 320 302   CMP Latest Ref Rng & Units 05/08/2020 04/12/2020 10/31/2019  Glucose 65 - 99 mg/dL 93 84 89  BUN 8 - 27 mg/dL 15 14 13   Creatinine 0.57 - 1.00 mg/dL 0.96 0.99 0.79  Sodium 134 - 144 mmol/L 141 137 136  Potassium 3.5 - 5.2 mmol/L 4.0 4.5 4.1  Chloride 96 - 106 mmol/L 98 95(L) 96(L)  CO2 20 - 29 mmol/L 30(H) 28 33(H)  Calcium 8.7 - 10.3 mg/dL 9.2 9.6 8.8(L)  Total Protein 6.5 - 8.1 g/dL - - 6.1(L)  Total Bilirubin 0.3 - 1.2 mg/dL - - 0.8  Alkaline Phos 38 - 126 U/L - - 37(L)  AST 15 - 41 U/L - - 27  ALT 0 - 44 U/L - - 22     No results found for: CEA1 / No results found for: CEA1 No results found for: PSA1 No results found for: [id number] No results found for: [id number]  No results found for: TOTALPROTELP, ALBUMINELP, A1GS, A2GS, BETS, BETA2SER, GAMS, MSPIKE, SPEI Lab Results  Component Value Date   FERRITIN 40 10/31/2019   FERRITIN 64 10/30/2019   FERRITIN 91 10/29/2019   Lab Results  Component Value Date   LDH 216 (H) 10/28/2019    STUDIES:  No results found.     HISTORY:   Past Medical History:  Diagnosis Date  . Abdominal distention   . Abnormal echocardiogram 02/03/2018  . Abnormal weight gain   . Acute bronchitis   . Adult BMI 37.0-37.9 kg/sq m   . Anxiety   . At moderate risk for fall   . Bilateral edema of lower extremity 02/03/2018  . Body mass index 40.0-44.9, adult ([place])   . Chest pain   . Chronic diastolic heart failure ([place])   . Depression 02/03/2018  . 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 Stage 0 (Tis, N0, M0)  . Dyslipidemia 02/03/2018  . Dyspnea on effort   . Essential hypertension 02/03/2018  . Fatigue 02/03/2018  . General weakness   . Generalized obesity   . Hematoma of lower extremity, left, initial encounter   . Hyperglycemia 02/03/2018  . Hyperlipidemia   . 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 Hypothyroidism 02/03/2018  . Hypoxemia   . Insomnia   . Left bundle branch block 04/06/2019  . Malaise and fatigue   . 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 Mitral regurgitation 04/06/2019  . Mitral stenosis 10/12/2018  . Nocturnal hypoxia   . Otitis media, acute serous   . 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 Pneumonia due to COVID-19 virus 10/28/2019  . Postoperative examination 09/21/2019  . Trigeminal neuralgia 02/03/2018  . Vitamin D deficiency     Past Surgical History:  Procedure Laterality Date  . BREAST LUMPECTOMY Right 07/2019  . CARDIAC CATHETERIZATION    . DILATION AND CURETTAGE OF UTERUS    . TUBAL LIGATION      Family History  Problem Relation Age of Onset  . Breast cancer Mother   . Hypertension Father   . Diabetes Brother   . Testicular cancer Son   . Colon cancer Son   . Stomach cancer Son     Social History:  reports that she has never smoked. She has never used smokeless tobacco.  She reports previous alcohol use. She reports previous drug use.The patient is  alone today.  Allergies: No Known Allergies  Current Medications: Current Outpatient Medications  Medication Sig Dispense Refill  . aspirin EC 81 MG tablet Take 81 mg by mouth daily.    . Calcium Carb-Cholecalciferol (CALCIUM 600+D3) 600-200 MG-UNIT TABS Take 1 tablet by mouth 2 (two) times daily.     . carvedilol (COREG) 25 MG tablet Take 25 mg by mouth 2 (two) times daily with a meal.    . HYDROcodone-acetaminophen (NORCO/VICODIN) 5-325 MG tablet hydrocodone 5 mg-acetaminophen 325 mg tablet  TAKE 1 TABLET BY MOUTH EVERY 8 HOURS AS NEEDED FOR PAIN    . levothyroxine (SYNTHROID, LEVOTHROID) 50 MCG tablet Take 50 mcg by mouth daily before breakfast.    . lisinopril (PRINIVIL,ZESTRIL) 40 MG tablet Take 40 mg by mouth daily.    . Omega-3 Fatty Acids (FISH OIL) 1200 MG CAPS Take 1 capsule by mouth daily.    . ondansetron (ZOFRAN) 4 MG tablet ondansetron HCl 4 mg tablet  TAKE 1 TABLET BY MOUTH EVERY 4 TO 6 HOURS AS NEEDED FOR NAUSEA AND VOMITING    . polyvinyl alcohol (LIQUIFILM TEARS) 1.4 % ophthalmic solution Place 1 drop into both eyes every 6 (six) hours as needed for dry eyes.    . raloxifene (EVISTA) 60 MG tablet TAKE 1 TABLET EVERY DAY 90 tablet 3  . simvastatin (ZOCOR) 20 MG tablet Take 20 mg by mouth daily.    Marland Kitchen torsemide (DEMADEX) 100 MG tablet Take 0.5 tablets (50 mg total) by mouth 2 (two) times daily. 90 tablet 3   No current facility-administered medications for this visit.     ASSESSMENT & PLAN:   Assessment/Plan:  Nicole Baldwin is a 80 y.o. female with ductal carcinoma in situ of the right breast diagnosed in September 2020.  She remains without evidence of recurrence.  She knows to continue raloxifene for at least 5 years.  She also has osteopenia with worsening in the femur in August 2021, for which she continues calcium and vitamin D. She injured her left knee recently, but it does appear to be improving and there are no abnormal findings on examination.  I  encouraged her to consider another colonoscopy due to her first-degree relative with colon cancer.  He  We will plan to see her back in 4 months for re-examination.  The patient understands the plans discussed today and is in agreement with them. She knows to contact our office if she develops issues requiring immediate clinical assessment.    Marvia Pickles, PA-C

## 2020-11-21 NOTE — Progress Notes (Signed)
[facility]  [address] 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  Current Treatment:    Chemoprevention with raloxifene 60 mg daily   HISTORY OF PRESENT ILLNESS:  Nicole Baldwin is a 80 y.o. 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  Annual screening mammogram in August 2020 revealed a suspicious possible mass of the right breast.  Diagnostic right mammogram and ultrasound confirmed a hypoechoic oval mass with indistinct margins measuring 5 x 4 x 4 mm at 1 o'clock in the right breast, 6 cm from the nipple. Ultrasound guided biopsy in September revealed this to be a papillary carcinoma, at least in situ.  Estrogen receptor was 100% positive and progesterone receptor was 70% positive.  She underwent lumpectomy in October.  Pathology revealed 0.9 cm, intermediate grade, ductal carcinoma in situ with negative margin.  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  She was placed on chemoprevention with raloxifene 60 mg daily in November 2020 and is planned for 5 years.   She contracted COVID in January 2021 and was hospitalized for 4 days.  She was sent home on oxygen, but was able to be weaned off of this.  Annual bilateral mammogram from August did not reveal any evidence of malignancy.  Bone density scan revealed worsening osteopenia with a T-score of -1.9 of the left femur neck, previously -1.1, a T-score of -0.3 int the total femur, which is normal, and a T-score of -1.2 in the spine. which was stable.  She is taking calcium 600 mg and vitamin D3 1000 international units daily.  INTERVAL HISTORY:  [name] is here today  for repeat clinical assessment and states she continues raloxifene daily without significant difficulty. She denies any changes in her breasts.  She is concerned as she has gained weight since her last visit.  She states she is back on oxygen at night.  She also hurt her left knee, while trying to remove a sock about 3 weeks ago.  The pain seems to be improving with ice and Arthritis Strength Tylenol.  She is able to ambulate without significant difficulty. She denies fevers or chills. Her appetite is good. Her weight has increased 8 pounds over last 4 months.  She asks about something for weight loss. She is on levothyroxine for hypothyroidism and has her thyroid checked regularly, in addition to her other labs, at [facility] office. She is not up-to-date on screening colonoscopy.  Her son has history of colon cancer.  REVIEW OF SYSTEMS:  Review of Systems  Constitutional: Negative for appetite change, chills, fatigue, fever and unexpected weight change.  HENT:   Negative for lump/mass, mouth sores and sore throat.   Respiratory: Negative for cough and shortness of breath.   Cardiovascular: Negative for chest pain and leg swelling.  Gastrointestinal: Negative for abdominal pain, constipation, diarrhea, nausea and vomiting.  Genitourinary: Negative for difficulty urinating, dysuria, frequency and hematuria.   Musculoskeletal: Positive for arthralgias (left knee pain). Negative for back pain and myalgias.  Skin: Negative for rash.  Neurological: Negative for dizziness and headaches.  Psychiatric/Behavioral: Negative for depression and sleep  disturbance. The patient is not nervous/anxious.      VITALS:  Blood pressure (!) 144/67, pulse 66, temperature 98.9 F (37.2 C), temperature source Oral, resp. rate 18, height 5\' 1"  (1.549 m), weight 233 lb 3.2 oz (105.8 kg), SpO2 94 %.  Wt Readings from Last 3 Encounters:  11/19/20 233 lb 3.2 oz (105.8 kg)  09/10/20 224 lb 9.6 oz (101.9 kg)  06/19/20 226 lb  (102.5 kg)    Body mass index is 44.06 kg/m.  Performance status (ECOG): 1 - Symptomatic but completely ambulatory  PHYSICAL EXAM:  Physical Exam Vitals and nursing note reviewed.  Constitutional:      Appearance: Normal appearance.  HENT:     Mouth/Throat:     Mouth: Mucous membranes are moist.     Pharynx: Oropharynx is clear. No oropharyngeal exudate or posterior oropharyngeal erythema.  Eyes:     General: No scleral icterus.    Extraocular Movements: Extraocular movements intact.     Conjunctiva/sclera: Conjunctivae normal.     Pupils: Pupils are equal, round, and reactive to light.  Cardiovascular:     Rate and Rhythm: Normal rate and regular rhythm.     Heart sounds: Heart sounds are distant. No murmur heard. No friction rub. No gallop.   Pulmonary:     Effort: No respiratory distress.     Breath sounds: Normal breath sounds. No stridor. No wheezing, rhonchi or rales.  Chest:  Breasts:     Right: Inverted nipple (sinve surgery) present. No swelling, bleeding, mass, nipple discharge, skin change, tenderness, axillary adenopathy or supraclavicular adenopathy.     Left: Normal. No swelling, bleeding, inverted nipple, mass, nipple discharge, skin change, tenderness, axillary adenopathy or supraclavicular adenopathy.    Abdominal:     General: Abdomen is protuberant. There is no distension.     Palpations: Abdomen is soft. There is no hepatomegaly, splenomegaly or mass.     Tenderness: There is no abdominal tenderness. There is no guarding.     Hernia: No hernia is present.  Musculoskeletal:        General: No tenderness. Normal range of motion.     Cervical back: Normal range of motion and neck supple. No tenderness.     Left knee: No swelling, deformity, effusion, erythema or ecchymosis. Normal range of motion. No tenderness.     Instability Tests: Anterior drawer test negative. Posterior drawer test negative. Medial McMurray test negative and lateral McMurray test  negative.     Right lower leg: No edema.     Left lower leg: No edema.  Lymphadenopathy:     Cervical: No cervical adenopathy.     Upper Body:     Right upper body: No supraclavicular or axillary adenopathy.     Left upper body: No supraclavicular or axillary adenopathy.     Lower Body: No right inguinal adenopathy. No left inguinal adenopathy.  Skin:    General: Skin is warm and dry.     Coloration: Skin is not jaundiced.     Findings: No rash.  Neurological:     Mental Status: She is alert and oriented to person, place, and time.     Cranial Nerves: No cranial nerve deficit.  Psychiatric:        Mood and Affect: Mood normal.        Behavior: Behavior normal.        Thought Content: Thought content normal.    LABS:   CBC Latest Ref Rng & Units 10/31/2019 10/30/2019 10/29/2019  WBC 4.0 - 10.5 K/uL 7.2 6.7 5.4  Hemoglobin 12.0 - 15.0 g/dL 10.9(L) 11.1(L) 11.3(L)  Hematocrit 36.0 - 46.0 % 34.5(L) 34.6(L) 35.0(L)  Platelets 150 - 400 K/uL 323 320 302   CMP Latest Ref Rng & Units 05/08/2020 04/12/2020 10/31/2019  Glucose 65 - 99 mg/dL 93 84 89  BUN 8 - 27 mg/dL 15 14 13   Creatinine 0.57 - 1.00 mg/dL 0.96 0.99 0.79  Sodium 134 - 144 mmol/L 141 137 136  Potassium 3.5 - 5.2 mmol/L 4.0 4.5 4.1  Chloride 96 - 106 mmol/L 98 95(L) 96(L)  CO2 20 - 29 mmol/L 30(H) 28 33(H)  Calcium 8.7 - 10.3 mg/dL 9.2 9.6 8.8(L)  Total Protein 6.5 - 8.1 g/dL - - 6.1(L)  Total Bilirubin 0.3 - 1.2 mg/dL - - 0.8  Alkaline Phos 38 - 126 U/L - - 37(L)  AST 15 - 41 U/L - - 27  ALT 0 - 44 U/L - - 22     No results found for: CEA1 / No results found for: CEA1 No results found for: PSA1 No results found for: NUU725 No results found for: DGU440  No results found for: TOTALPROTELP, ALBUMINELP, A1GS, A2GS, BETS, BETA2SER, GAMS, MSPIKE, SPEI Lab Results  Component Value Date   FERRITIN 40 10/31/2019   FERRITIN 64 10/30/2019   FERRITIN 91 10/29/2019   Lab Results  Component Value Date   LDH 216 (H)  10/28/2019    STUDIES:  No results found.    HISTORY:   Past Medical History:  Diagnosis Date  . Abdominal distention   . Abnormal echocardiogram 02/03/2018  . Abnormal weight gain   . Acute bronchitis   . Adult BMI 37.0-37.9 kg/sq m   . Anxiety   . At moderate risk for fall   . Bilateral edema of lower extremity 02/03/2018  . Body mass index 40.0-44.9, adult ([place])   . Chest pain   . Chronic diastolic heart failure ([place])   . Depression 02/03/2018  . 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 Stage 0 (Tis, N0, M0)  . Dyslipidemia 02/03/2018  . Dyspnea on effort   . Essential hypertension 02/03/2018  . Fatigue 02/03/2018  . General weakness   . Generalized obesity   . Hematoma of lower extremity, left, initial encounter   . Hyperglycemia 02/03/2018  . Hyperlipidemia   . 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 Hypothyroidism 02/03/2018  . Hypoxemia   . Insomnia   . Left bundle branch block 04/06/2019  . Malaise and fatigue   . 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 Mitral regurgitation 04/06/2019  . Mitral stenosis 10/12/2018  . Nocturnal hypoxia   . Otitis media, acute serous   . 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 Pneumonia due to COVID-19 virus 10/28/2019  . Postoperative examination 09/21/2019  . Trigeminal neuralgia 02/03/2018  . Vitamin D deficiency     Past Surgical History:  Procedure Laterality Date  . BREAST LUMPECTOMY Right 07/2019  . CARDIAC CATHETERIZATION    . DILATION AND CURETTAGE OF UTERUS    . TUBAL LIGATION      Family History  Problem Relation Age of Onset  . Breast cancer Mother   . Hypertension Father   . Diabetes Brother   . Testicular cancer Son   . Colon cancer Son   . Stomach cancer Son     Social History:  reports that she has never smoked. She has never used smokeless tobacco. She reports previous alcohol use.  She reports previous drug use.The patient is alone today.  Allergies: No Known Allergies  Current Medications: Current Outpatient Medications  Medication Sig Dispense Refill  . aspirin EC 81 MG tablet Take 81 mg by mouth daily.    . Calcium Carb-Cholecalciferol (CALCIUM 600+D3) 600-200 MG-UNIT TABS Take 1 tablet by mouth 2 (two) times daily.     . carvedilol (COREG) 25 MG tablet Take 25 mg by mouth 2 (two) times daily with a meal.    . HYDROcodone-acetaminophen (NORCO/VICODIN) 5-325 MG tablet hydrocodone 5 mg-acetaminophen 325 mg tablet  TAKE 1 TABLET BY MOUTH EVERY 8 HOURS AS NEEDED FOR PAIN    . levothyroxine (SYNTHROID, LEVOTHROID) 50 MCG tablet Take 50 mcg by mouth daily before breakfast.    . lisinopril (PRINIVIL,ZESTRIL) 40 MG tablet Take 40 mg by mouth daily.    . Omega-3 Fatty Acids (FISH OIL) 1200 MG CAPS Take 1 capsule by mouth daily.    . ondansetron (ZOFRAN) 4 MG tablet ondansetron HCl 4 mg tablet  TAKE 1 TABLET BY MOUTH EVERY 4 TO 6 HOURS AS NEEDED FOR NAUSEA AND VOMITING    . polyvinyl alcohol (LIQUIFILM TEARS) 1.4 % ophthalmic solution Place 1 drop into both eyes every 6 (six) hours as needed for dry eyes.    . raloxifene (EVISTA) 60 MG tablet TAKE 1 TABLET EVERY DAY 90 tablet 3  . simvastatin (ZOCOR) 20 MG tablet Take 20 mg by mouth daily.    Marland Kitchen torsemide (DEMADEX) 100 MG tablet Take 0.5 tablets (50 mg total) by mouth 2 (two) times daily. 90 tablet 3   No current facility-administered medications for this visit.     ASSESSMENT & PLAN:   Assessment/Plan:  Jaelani Posa is a 80 y.o. female with ductal carcinoma in situ of the right breast diagnosed in September 2020.  She remains without evidence of recurrence.  She knows to continue raloxifene for at least a total of 5 years.  She also has osteopenia with worsening in the femur in August 2021, for which she continues calcium and vitamin D.  The raloxifene should help her bone density as well. She injured her left  knee recently, but it does appear to be improving and there are no abnormal findings on examination.  I encouraged her to consider another colonoscopy due to her first-degree relative with colon cancer.  We will plan to see her back in 4 months for re-examination.  The patient understands the plans discussed today and is in agreement with them. She knows to contact our office if she develops issues requiring immediate clinical assessment.     Marvia Pickles, PA-C

## 2020-12-07 DIAGNOSIS — R0902 Hypoxemia: Secondary | ICD-10-CM | POA: Diagnosis not present

## 2020-12-07 DIAGNOSIS — I5032 Chronic diastolic (congestive) heart failure: Secondary | ICD-10-CM | POA: Diagnosis not present

## 2020-12-07 DIAGNOSIS — G4734 Idiopathic sleep related nonobstructive alveolar hypoventilation: Secondary | ICD-10-CM | POA: Diagnosis not present

## 2021-01-03 DIAGNOSIS — H900 Conductive hearing loss, bilateral: Secondary | ICD-10-CM | POA: Diagnosis not present

## 2021-01-03 DIAGNOSIS — J9611 Chronic respiratory failure with hypoxia: Secondary | ICD-10-CM | POA: Diagnosis not present

## 2021-01-03 DIAGNOSIS — Z6841 Body Mass Index (BMI) 40.0 and over, adult: Secondary | ICD-10-CM | POA: Diagnosis not present

## 2021-01-03 DIAGNOSIS — M25562 Pain in left knee: Secondary | ICD-10-CM | POA: Diagnosis not present

## 2021-01-03 DIAGNOSIS — H6121 Impacted cerumen, right ear: Secondary | ICD-10-CM | POA: Diagnosis not present

## 2021-01-03 DIAGNOSIS — Z9981 Dependence on supplemental oxygen: Secondary | ICD-10-CM | POA: Diagnosis not present

## 2021-01-04 DIAGNOSIS — G4734 Idiopathic sleep related nonobstructive alveolar hypoventilation: Secondary | ICD-10-CM | POA: Diagnosis not present

## 2021-01-04 DIAGNOSIS — R0902 Hypoxemia: Secondary | ICD-10-CM | POA: Diagnosis not present

## 2021-01-04 DIAGNOSIS — I5032 Chronic diastolic (congestive) heart failure: Secondary | ICD-10-CM | POA: Diagnosis not present

## 2021-01-16 DIAGNOSIS — Z1331 Encounter for screening for depression: Secondary | ICD-10-CM | POA: Diagnosis not present

## 2021-01-16 DIAGNOSIS — E669 Obesity, unspecified: Secondary | ICD-10-CM | POA: Diagnosis not present

## 2021-01-16 DIAGNOSIS — E785 Hyperlipidemia, unspecified: Secondary | ICD-10-CM | POA: Diagnosis not present

## 2021-01-16 DIAGNOSIS — Z0000 Encounter for general adult medical examination without abnormal findings: Secondary | ICD-10-CM | POA: Diagnosis not present

## 2021-01-16 DIAGNOSIS — Z139 Encounter for screening, unspecified: Secondary | ICD-10-CM | POA: Diagnosis not present

## 2021-01-16 DIAGNOSIS — Z9181 History of falling: Secondary | ICD-10-CM | POA: Diagnosis not present

## 2021-01-22 DIAGNOSIS — E039 Hypothyroidism, unspecified: Secondary | ICD-10-CM | POA: Diagnosis not present

## 2021-01-22 DIAGNOSIS — Z6841 Body Mass Index (BMI) 40.0 and over, adult: Secondary | ICD-10-CM | POA: Diagnosis not present

## 2021-01-22 DIAGNOSIS — H9193 Unspecified hearing loss, bilateral: Secondary | ICD-10-CM | POA: Diagnosis not present

## 2021-01-22 DIAGNOSIS — I5032 Chronic diastolic (congestive) heart failure: Secondary | ICD-10-CM | POA: Diagnosis not present

## 2021-01-22 DIAGNOSIS — I10 Essential (primary) hypertension: Secondary | ICD-10-CM | POA: Diagnosis not present

## 2021-01-22 DIAGNOSIS — R739 Hyperglycemia, unspecified: Secondary | ICD-10-CM | POA: Diagnosis not present

## 2021-01-22 DIAGNOSIS — E785 Hyperlipidemia, unspecified: Secondary | ICD-10-CM | POA: Diagnosis not present

## 2021-01-22 DIAGNOSIS — J9611 Chronic respiratory failure with hypoxia: Secondary | ICD-10-CM | POA: Diagnosis not present

## 2021-01-22 DIAGNOSIS — Z9981 Dependence on supplemental oxygen: Secondary | ICD-10-CM | POA: Diagnosis not present

## 2021-02-03 IMAGING — DX DG CHEST 1V PORT
1 series · 1 of 1 positions shown · non-contrast
Comparison: CT 10/27/2019.  Chest x-ray 10/27/2019, 01/01/2018.

CLINICAL DATA: Acute respiratory disease due to [id number].

EXAM:
PORTABLE CHEST 1 VIEW

[chest]
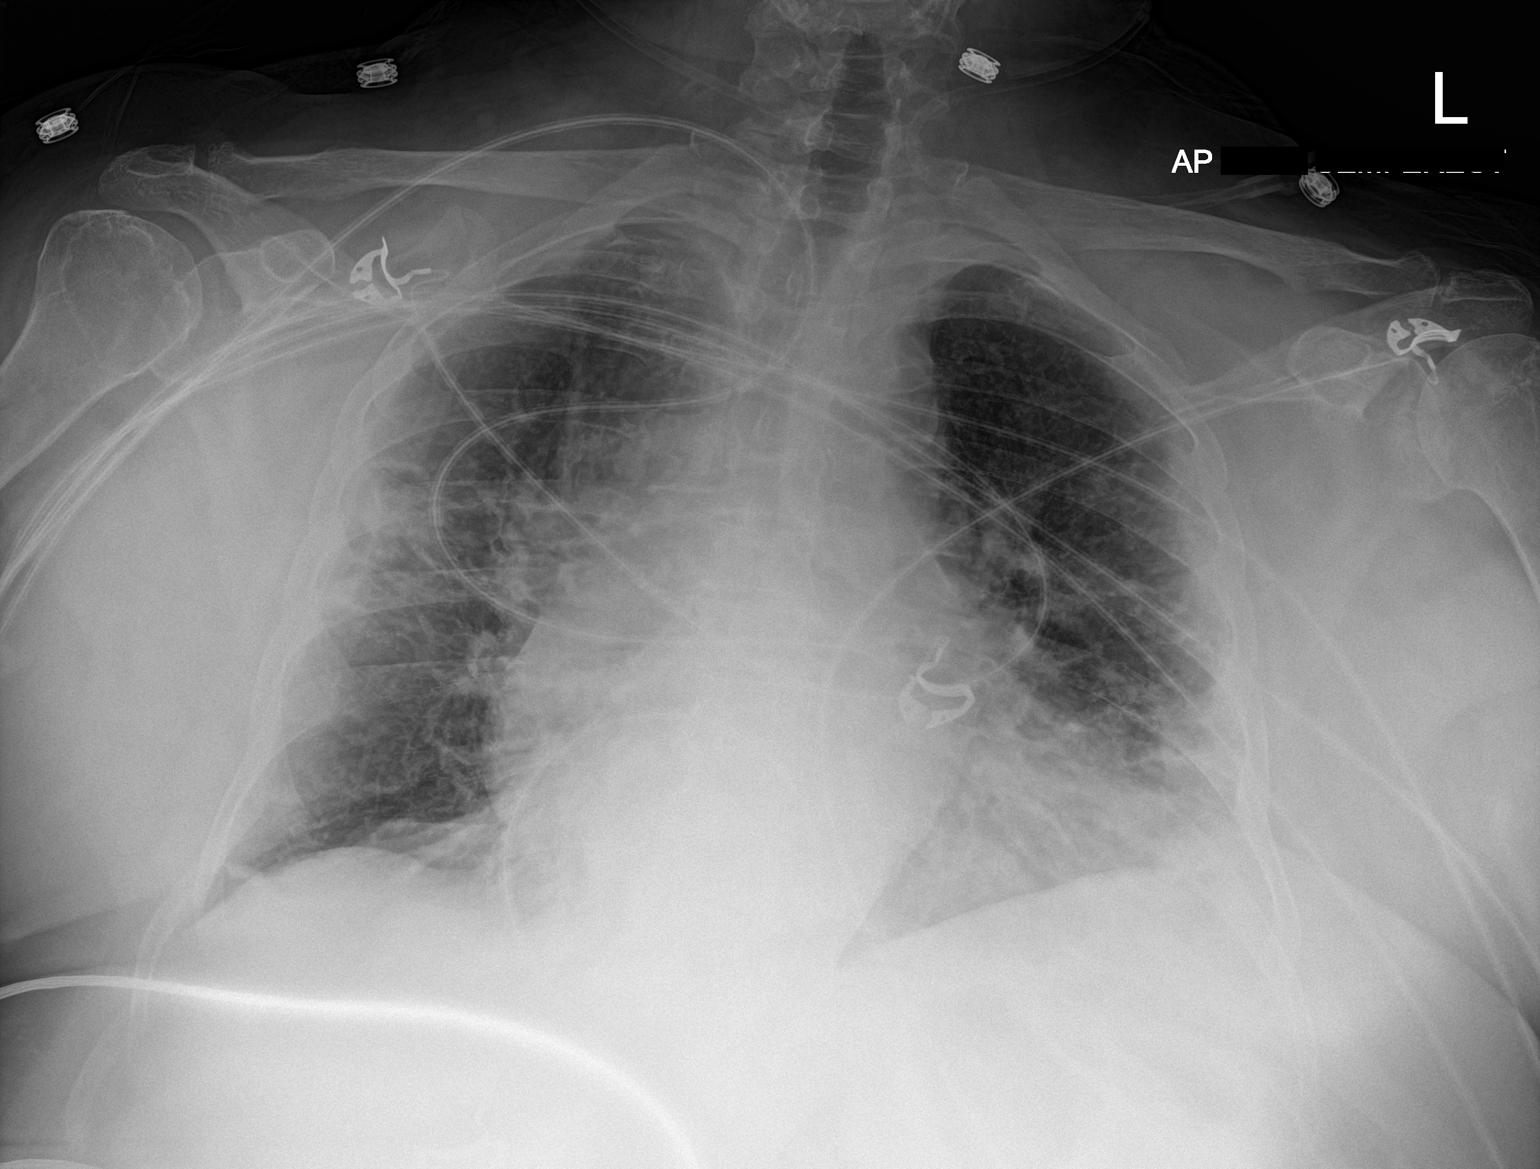

[1 of 1 positions shown; findings below may reference images not displayed]

FINDINGS: Mediastinum is stable. Stable cardiomegaly. Multifocal bilateral
pulmonary infiltrates are again noted. Similar findings noted on
prior exam. No prominent pleural effusion. No pneumothorax. No acute
bony abnormality identified.
IMPRESSION: Multifocal bilateral pulmonary infiltrates are again noted. Similar
findings noted on prior exam.

## 2021-02-04 DIAGNOSIS — G4734 Idiopathic sleep related nonobstructive alveolar hypoventilation: Secondary | ICD-10-CM | POA: Diagnosis not present

## 2021-02-04 DIAGNOSIS — R0902 Hypoxemia: Secondary | ICD-10-CM | POA: Diagnosis not present

## 2021-02-04 DIAGNOSIS — I5032 Chronic diastolic (congestive) heart failure: Secondary | ICD-10-CM | POA: Diagnosis not present

## 2021-03-01 DIAGNOSIS — H6121 Impacted cerumen, right ear: Secondary | ICD-10-CM | POA: Diagnosis not present

## 2021-03-01 DIAGNOSIS — H6593 Unspecified nonsuppurative otitis media, bilateral: Secondary | ICD-10-CM | POA: Diagnosis not present

## 2021-03-01 DIAGNOSIS — H9190 Unspecified hearing loss, unspecified ear: Secondary | ICD-10-CM | POA: Diagnosis not present

## 2021-03-01 DIAGNOSIS — H6983 Other specified disorders of Eustachian tube, bilateral: Secondary | ICD-10-CM | POA: Diagnosis not present

## 2021-03-01 DIAGNOSIS — J342 Deviated nasal septum: Secondary | ICD-10-CM | POA: Diagnosis not present

## 2021-03-06 DIAGNOSIS — I5032 Chronic diastolic (congestive) heart failure: Secondary | ICD-10-CM | POA: Diagnosis not present

## 2021-03-06 DIAGNOSIS — G4734 Idiopathic sleep related nonobstructive alveolar hypoventilation: Secondary | ICD-10-CM | POA: Diagnosis not present

## 2021-03-06 DIAGNOSIS — R0902 Hypoxemia: Secondary | ICD-10-CM | POA: Diagnosis not present

## 2021-03-11 NOTE — Progress Notes (Addendum)
Cardiology Office Note:    Date:  03/12/2021   ID:  Nicole Baldwin, DOB 1941-02-09, MRN [id number]  PCP:  Lowella Dandy, NP  Cardiologist:  Shirlee More, MD    Referring MD: Lowella Dandy, NP    ASSESSMENT:    1. Chronic diastolic heart failure ([place])   2. Left bundle branch block   3. Aortic stenosis, mild   4. Nonrheumatic mitral valve stenosis   5. Hypertensive heart disease with heart failure ([place])    PLAN:    In order of problems listed above:  1. Stable she takes high-dose loop diuretic she has no evidence of fluid overload and symptoms of heart failure improved continue her current loop diuretic along with antihypertensive carvedilol and lisinopril.  Recent labs from her PCP office reviewed 2. Stable EKG pattern 3. Recheck echocardiogram my concern is the severity of her calcific mitral stenosis and will need to reassess at this time 4. BP at target continue current antihypertensives   Next appointment: 6 months   Medication Adjustments/Labs and Tests Ordered: Current medicines are reviewed at length with the patient today.  Concerns regarding medicines are outlined above.  No orders of the defined types were placed in this encounter.  No orders of the defined types were placed in this encounter.   Chief Complaint  Patient presents with  . Follow-up  . Congestive Heart Failure    History of Present Illness:    Nicole Baldwin is a 80 y.o. female with a hx of chronic diastolic heart failure left bundle branch block mild aortic and mitral stenosis and COVID-19 pneumonia January 2021 last seen 09/10/2020.  Echocardiogram 09/15/2019 showed EF 55 to 60% mild concentric LVH elevated left ventricular end-diastolic pressure with mild aortic stenosis mean gradient 11 mmHg and mild mitral valve gradient of less than 5 mmHg with mild mitral regurgitation and annular calcification.  Compliance with diet, lifestyle and medications: Yes  She presently takes  torsemide 100 mg in the morning 40 in the evening. Overall she is done better no edema less exertional short of breath is able to get outside and walk on her drive but is still short of breath if she climbs stairs or is in a rush. She never had a sleep study and she uses nocturnal oxygen. No orthopnea chest pain palpitation or syncope. Recent labs 01/22/2021 cholesterol 231 LDL 155 triglycerides 172 HDL 45 A1c at target 5.4% creatinine 1.20 Past Medical History:  Diagnosis Date  . Abdominal distention   . Abnormal echocardiogram 02/03/2018  . Abnormal weight gain   . Acute bronchitis   . Adult BMI 37.0-37.9 kg/sq m   . Anxiety   . At moderate risk for fall   . Bilateral edema of lower extremity 02/03/2018  . Body mass index 40.0-44.9, adult ([place])   . Chest pain   . Chronic diastolic heart failure ([place])   . Depression 02/03/2018  . 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 Stage 0 (Tis, N0, M0)  . Dyslipidemia 02/03/2018  . Dyspnea on effort   . Essential hypertension 02/03/2018  . Fatigue 02/03/2018  . General weakness   . Generalized obesity   . Hematoma of lower extremity, left, initial encounter   . Hyperglycemia 02/03/2018  . Hyperlipidemia   . Hypertensive heart disease with heart failure ([place]) 02/11/2018  . Hypothyroidism 02/03/2018  . Hypoxemia   . Insomnia   .  Left bundle branch block 04/06/2019  . Malaise and fatigue   . 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 Mitral regurgitation 04/06/2019  . Mitral stenosis 10/12/2018  . Nocturnal hypoxia   . Otitis media, acute serous   . 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 Pneumonia due to COVID-19 virus 10/28/2019  . Postoperative examination 09/21/2019  . Trigeminal neuralgia 02/03/2018  . Vitamin D deficiency     Past Surgical History:  Procedure Laterality Date  . BREAST LUMPECTOMY Right 07/2019  . CARDIAC CATHETERIZATION    .  DILATION AND CURETTAGE OF UTERUS    . TUBAL LIGATION      Current Medications: Current Meds  Medication Sig  . aspirin EC 81 MG tablet Take 81 mg by mouth daily.  . Calcium Carb-Cholecalciferol 600-200 MG-UNIT TABS Take 1 tablet by mouth 2 (two) times daily.   . carvedilol (COREG) 25 MG tablet Take 25 mg by mouth 2 (two) times daily with a meal.  . fluticasone (FLONASE) 50 MCG/ACT nasal spray Place 1 spray into both nostrils in the morning and at bedtime.  Marland Kitchen levothyroxine (SYNTHROID, LEVOTHROID) 50 MCG tablet Take 50 mcg by mouth daily before breakfast.  . lisinopril (PRINIVIL,ZESTRIL) 40 MG tablet Take 40 mg by mouth daily.  . Omega-3 Fatty Acids (FISH OIL) 1200 MG CAPS Take 1 capsule by mouth daily.  . raloxifene (EVISTA) 60 MG tablet TAKE 1 TABLET EVERY DAY  . torsemide (DEMADEX) 100 MG tablet Take 100 mg by mouth in the morning.  . Torsemide 40 MG TABS Take 40 mg by mouth every evening.     Allergies:   Patient has no known allergies.   Social History   Socioeconomic History  . Marital status: Widowed    Spouse name: Not on file  . Number of children: Not on file  . Years of education: Not on file  . Highest education level: Not on file  Occupational History  . Not on file  Tobacco Use  . Smoking status: Never Smoker  . Smokeless tobacco: Never Used  Vaping Use  . Vaping Use: Never used  Substance and Sexual Activity  . Alcohol use: Not Currently  . Drug use: Not Currently  . Sexual activity: Not on file  Other Topics Concern  . Not on file  Social History Narrative  . Not on file   Social Determinants of Health   Financial Resource Strain: Not on file  Food Insecurity: Not on file  Transportation Needs: Not on file  Physical Activity: Not on file  Stress: Not on file  Social Connections: Not on file     Family History: The patient's family history includes Breast cancer in her mother; Colon cancer in her son; Diabetes in her brother; Hypertension in her  father; Stomach cancer in her son; Testicular cancer in her son. ROS:   Please see the history of present illness.    All other systems reviewed and are negative.  EKGs/Labs/Other Studies Reviewed:    The following studies were reviewed today:  EKG:  EKG ordered today and personally reviewed.  The ekg ordered today demonstrates sinus rhythm left bundle branch block  Recent Labs: 04/12/2020: NT-Pro BNP 95 05/08/2020: BUN 15; Creatinine, Ser 0.96; Potassium 4.0; Sodium 141    Physical Exam:    VS:  BP (!) 143/83   Pulse 63   Ht 5\' 1"  (1.549 m)   Wt 231 lb (104.8 kg)   SpO2 92%   BMI 43.65 kg/m  Wt Readings from Last 3 Encounters:  03/12/21 231 lb (104.8 kg)  11/19/20 233 lb 3.2 oz (105.8 kg)  09/10/20 224 lb 9.6 oz (101.9 kg)     GEN: BMI exceeds 40 well nourished, well developed in no acute distress HEENT: Normal NECK: No JVD; No carotid bruits LYMPHATICS: No lymphadenopathy CARDIAC: Grade 1/6 to 2/6 aortic outflow murmur 1/6 to 2/6 mitral stenosis RRR, no rubs, gallops RESPIRATORY:  Clear to auscultation without rales, wheezing or rhonchi  ABDOMEN: Soft, non-tender, non-distended MUSCULOSKELETAL:  No edema; No deformity  SKIN: Warm and dry NEUROLOGIC:  Alert and oriented x 3 PSYCHIATRIC:  Normal affect    Signed, Shirlee More, MD  03/12/2021 3:46 PM    [facility]

## 2021-03-12 ENCOUNTER — Ambulatory Visit: Payer: Medicare HMO | Admitting: Cardiology

## 2021-03-12 ENCOUNTER — Encounter: Payer: Self-pay | Admitting: Cardiology

## 2021-03-12 ENCOUNTER — Other Ambulatory Visit: Payer: Self-pay

## 2021-03-12 VITALS — BP 143/83 | HR 63 | Ht 61.0 in | Wt 231.0 lb

## 2021-03-12 DIAGNOSIS — I350 Nonrheumatic aortic (valve) stenosis: Secondary | ICD-10-CM

## 2021-03-12 DIAGNOSIS — I342 Nonrheumatic mitral (valve) stenosis: Secondary | ICD-10-CM | POA: Diagnosis not present

## 2021-03-12 DIAGNOSIS — I5032 Chronic diastolic (congestive) heart failure: Secondary | ICD-10-CM | POA: Diagnosis not present

## 2021-03-12 DIAGNOSIS — I110 Hypertensive heart disease with heart failure: Secondary | ICD-10-CM | POA: Diagnosis not present

## 2021-03-12 DIAGNOSIS — I447 Left bundle-branch block, unspecified: Secondary | ICD-10-CM

## 2021-03-12 NOTE — Patient Instructions (Signed)

## 2021-03-22 ENCOUNTER — Telehealth: Payer: Self-pay | Admitting: Hematology and Oncology

## 2021-03-22 ENCOUNTER — Encounter: Payer: Self-pay | Admitting: Hematology and Oncology

## 2021-03-22 ENCOUNTER — Inpatient Hospital Stay: Payer: Medicare HMO | Attending: Oncology | Admitting: Hematology and Oncology

## 2021-03-22 ENCOUNTER — Other Ambulatory Visit: Payer: Self-pay

## 2021-03-22 VITALS — BP 162/70 | HR 63 | Temp 98.2°F | Resp 18 | Ht 61.0 in | Wt 232.1 lb

## 2021-03-22 DIAGNOSIS — D0511 Intraductal carcinoma in situ of right breast: Secondary | ICD-10-CM | POA: Diagnosis not present

## 2021-03-22 NOTE — Telephone Encounter (Signed)
Per 6/10 LOS, patient scheduled for Dec Appt's.  Gave patient Appt Summary

## 2021-03-22 NOTE — Progress Notes (Signed)
[facility]  [address] 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  Current Treatment:    Chemoprevention with raloxifene 60 mg daily   HISTORY OF PRESENT ILLNESS:  Nicole Baldwin is a 80 y.o. 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  Annual screening mammogram in August 2020 revealed a suspicious possible mass of the right breast.  Diagnostic right mammogram and ultrasound confirmed a hypoechoic oval mass with indistinct margins measuring 5 x 4 x 4 mm at 1 o'clock in the right breast, 6 cm from the nipple. Ultrasound guided biopsy in September revealed this to be a papillary carcinoma, at least in situ.  Estrogen receptor was 100% positive and progesterone receptor was 70% positive.  She underwent lumpectomy in October.  Pathology revealed 0.9 cm, intermediate grade, ductal carcinoma in situ with negative margin.  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  She was placed on chemoprevention with raloxifene 60 mg daily in November 2020 and is planned for 5 years.    She contracted COVID in January 2021 and was hospitalized for 4 days.  She was sent home on oxygen, but was able to be weaned off of this.  Annual bilateral mammogram from August did not reveal any evidence of malignancy.  Bone density scan revealed worsening osteopenia with a T-score of -1.9 of the left femur neck, previously -1.1, a T-score of -0.3 int the total femur, which is normal, and a T-score of -1.2 in the spine. which was stable.  She is taking calcium 600 mg and vitamin D3 1000 international units daily.  At her last visit in February, she had  injured her left knee, but that seemed to be improving. She also had gained weight and was using her oxygen again.  INTERVAL HISTORY:  Nicole Baldwin is here today for repeat examination and states she continues raloxifene daily without significant difficulty. She denies any changes in her breasts.   She states she has intermittent right knee pain that is not severe.  She denies fevers or chills. Her appetite is good. Her weight has been stable. She continues to follow with Dr. Coralie Keens and will see him after her mammogram in August. She has her labs checked regularly at [facility] office. She states she has decided against further colonoscopy, despite her son's history of colon cancer.  REVIEW OF SYSTEMS:  Review of Systems  Constitutional:  Negative for appetite change, chills, fatigue, fever and unexpected weight change.  HENT:   Negative for lump/mass, mouth sores and sore throat.   Respiratory:  Negative for cough and shortness of breath.   Cardiovascular:  Negative for chest pain and leg swelling.  Gastrointestinal:  Negative for abdominal pain, constipation, diarrhea, nausea and vomiting.  Endocrine: Positive for hot flashes (intermittent, mild).  Genitourinary:  Negative for difficulty urinating, dysuria, frequency, hematuria, vaginal bleeding and vaginal discharge.   Musculoskeletal:  Positive for arthralgias (right knee pain). Negative for back pain and myalgias.  Skin:  Negative for rash.  Neurological:  Negative for dizziness and headaches.  Psychiatric/Behavioral:  Negative for depression and sleep disturbance. The patient is not  nervous/anxious.     VITALS:  Blood pressure (!) 162/70, pulse 63, temperature 98.2 F (36.8 C), temperature source Oral, resp. rate 18, height 5\' 1"  (1.549 m), weight 232 lb 1.6 oz (105.3 kg), SpO2 94 %.  Wt Readings from Last 3 Encounters:  03/22/21 232 lb 1.6 oz (105.3 kg)  03/12/21 231 lb (104.8 kg)  11/19/20 233 lb 3.2 oz (105.8 kg)    Body mass index is  43.85 kg/m.  Performance status (ECOG): 1 - Symptomatic but completely ambulatory  PHYSICAL EXAM:  Physical Exam Vitals and nursing note reviewed.  Constitutional:      Appearance: Normal appearance.  HENT:     Mouth/Throat:     Mouth: Mucous membranes are moist.     Pharynx: Oropharynx is clear. No oropharyngeal exudate or posterior oropharyngeal erythema.  Eyes:     General: No scleral icterus.    Extraocular Movements: Extraocular movements intact.     Conjunctiva/sclera: Conjunctivae normal.     Pupils: Pupils are equal, round, and reactive to light.  Cardiovascular:     Rate and Rhythm: Normal rate and regular rhythm.     Heart sounds: Heart sounds are distant. No murmur heard.   No friction rub. No gallop.  Pulmonary:     Effort: No respiratory distress.     Breath sounds: Normal breath sounds. No stridor. No wheezing, rhonchi or rales.  Chest:  Breasts:    Right: Inverted nipple (since surgery) present. No swelling, bleeding, mass, nipple discharge, skin change, tenderness, axillary adenopathy or supraclavicular adenopathy.     Left: Normal. No swelling, bleeding, inverted nipple, mass, nipple discharge, skin change, tenderness, axillary adenopathy or supraclavicular adenopathy.  Abdominal:     General: Abdomen is protuberant. There is no distension.     Palpations: Abdomen is soft. There is no hepatomegaly, splenomegaly or mass.     Tenderness: There is no abdominal tenderness. There is no guarding.     Hernia: No hernia is present.  Musculoskeletal:        General: No tenderness. Normal range of motion.     Cervical back: Normal range of motion and neck supple. No tenderness.     Left knee: No swelling, deformity, effusion, erythema or ecchymosis. Normal range of motion. No tenderness.     Instability Tests: Anterior drawer test negative. Posterior drawer test negative. Medial McMurray test negative and lateral McMurray test negative.     Right lower leg: No edema.      Left lower leg: No edema.  Lymphadenopathy:     Cervical: No cervical adenopathy.     Upper Body:     Right upper body: No supraclavicular or axillary adenopathy.     Left upper body: No supraclavicular or axillary adenopathy.     Lower Body: No right inguinal adenopathy. No left inguinal adenopathy.  Skin:    General: Skin is warm and dry.     Coloration: Skin is not jaundiced.     Findings: No rash.  Neurological:     Mental Status: She is alert and oriented to person, place, and time.     Cranial Nerves: No cranial nerve deficit.  Psychiatric:        Mood and Affect: Mood normal.        Behavior: Behavior normal.        Thought Content: Thought content normal.   LABS:   CBC Latest Ref Rng & Units 10/31/2019 10/30/2019 10/29/2019  WBC 4.0 - 10.5 K/uL 7.2 6.7 5.4  Hemoglobin 12.0 - 15.0 g/dL 10.9(L) 11.1(L) 11.3(L)  Hematocrit 36.0 - 46.0 % 34.5(L) 34.6(L) 35.0(L)  Platelets 150 - 400 K/uL 323 320 302   CMP Latest Ref Rng & Units 05/08/2020 04/12/2020 10/31/2019  Glucose 65 - 99 mg/dL 93 84 89  BUN 8 - 27 mg/dL 15 14 13   Creatinine 0.57 - 1.00 mg/dL 0.96 0.99 0.79  Sodium 134 - 144 mmol/L 141 137 136  Potassium 3.5 - 5.2 mmol/L 4.0 4.5 4.1  Chloride 96 - 106 mmol/L 98 95(L) 96(L)  CO2 20 - 29 mmol/L 30(H) 28 33(H)  Calcium 8.7 - 10.3 mg/dL 9.2 9.6 8.8(L)  Total Protein 6.5 - 8.1 g/dL - - 6.1(L)  Total Bilirubin 0.3 - 1.2 mg/dL - - 0.8  Alkaline Phos 38 - 126 U/L - - 37(L)  AST 15 - 41 U/L - - 27  ALT 0 - 44 U/L - - 22     No results found for: CEA1 / No results found for: CEA1 No results found for: PSA1 No results found for: IRS854 No results found for: OEV035  No results found for: TOTALPROTELP, ALBUMINELP, A1GS, A2GS, BETS, BETA2SER, GAMS, MSPIKE, SPEI Lab Results  Component Value Date   FERRITIN 40 10/31/2019   FERRITIN 64 10/30/2019   FERRITIN 91 10/29/2019   Lab Results  Component Value Date   LDH 216 (H) 10/28/2019    STUDIES:  No results found.     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 Stage 0 (Tis, N0, M0)   Dyslipidemia 02/03/2018   Dyspnea on effort    Essential hypertension 02/03/2018   Fatigue 02/03/2018   General weakness    Generalized obesity    Hematoma of lower extremity, left, initial encounter    Hyperglycemia 02/03/2018   Hyperlipidemia    Hypertensive heart disease with heart failure ([place]) 02/11/2018   Hypothyroidism 02/03/2018   Hypoxemia    Insomnia    Left bundle branch block 04/06/2019   Malaise and fatigue    Mitral annular calcification 02/11/2018   Mitral regurgitation 04/06/2019   Mitral stenosis 10/12/2018   Nocturnal hypoxia    Otitis media, acute serous    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 She has  never used smokeless tobacco. She reports previous alcohol use. She reports previous drug use.The patient is alone today.  Allergies: No Known  Allergies  Current Medications: Current Outpatient Medications  Medication Sig Dispense Refill   aspirin EC 81 MG tablet Take 81 mg by mouth daily.     Calcium Carb-Cholecalciferol 600-200 MG-UNIT TABS Take 1 tablet by mouth 2 (two) times daily.      carvedilol (COREG) 25 MG tablet Take 25 mg by mouth 2 (two) times daily with a meal.     fluticasone (FLONASE) 50 MCG/ACT nasal spray Place 1 spray into both nostrils in the morning and at bedtime.     levothyroxine (SYNTHROID, LEVOTHROID) 50 MCG tablet Take 50 mcg by mouth daily before breakfast.     lisinopril (PRINIVIL,ZESTRIL) 40 MG tablet Take 40 mg by mouth daily.     Omega-3 Fatty Acids (FISH OIL) 1200 MG CAPS Take 1 capsule by mouth daily.     raloxifene (EVISTA) 60 MG tablet TAKE 1 TABLET EVERY DAY 90 tablet 3   torsemide (DEMADEX) 100 MG tablet Take 100 mg by mouth in the morning.     Torsemide 40 MG TABS Take 40 mg by mouth every evening.     No current facility-administered medications for this visit.     ASSESSMENT & PLAN:   Assessment/Plan:  Nicole Baldwin is a 80 y.o. female with ductal carcinoma in situ of the right breast diagnosed in September 2020.  She remains without evidence of recurrence.  She knows to continue raloxifene for at least a total of 5 years.  She also has osteopenia with worsening in the femur in August 2021, for which she continues calcium and vitamin D.  The raloxifene should help her bone density as well. As she is seeing Dr. Coralie Keens in 3 months with a mammogram, we will plan to see her back in 6 months for re-examination.  The patient understands the plans discussed today and is in agreement with them. She knows to contact our office if she develops issues requiring immediate clinical assessment.     Marvia Pickles, PA-C

## 2021-03-25 DIAGNOSIS — H9193 Unspecified hearing loss, bilateral: Secondary | ICD-10-CM | POA: Diagnosis not present

## 2021-03-25 DIAGNOSIS — H6593 Unspecified nonsuppurative otitis media, bilateral: Secondary | ICD-10-CM | POA: Diagnosis not present

## 2021-03-25 DIAGNOSIS — H6983 Other specified disorders of Eustachian tube, bilateral: Secondary | ICD-10-CM | POA: Diagnosis not present

## 2021-03-25 DIAGNOSIS — J342 Deviated nasal septum: Secondary | ICD-10-CM | POA: Diagnosis not present

## 2021-04-03 ENCOUNTER — Other Ambulatory Visit: Payer: Self-pay

## 2021-04-03 ENCOUNTER — Ambulatory Visit (INDEPENDENT_AMBULATORY_CARE_PROVIDER_SITE_OTHER): Payer: Medicare HMO

## 2021-04-03 DIAGNOSIS — I342 Nonrheumatic mitral (valve) stenosis: Secondary | ICD-10-CM | POA: Diagnosis not present

## 2021-04-03 DIAGNOSIS — I447 Left bundle-branch block, unspecified: Secondary | ICD-10-CM | POA: Diagnosis not present

## 2021-04-03 DIAGNOSIS — I5032 Chronic diastolic (congestive) heart failure: Secondary | ICD-10-CM

## 2021-04-03 DIAGNOSIS — I350 Nonrheumatic aortic (valve) stenosis: Secondary | ICD-10-CM | POA: Diagnosis not present

## 2021-04-03 DIAGNOSIS — I110 Hypertensive heart disease with heart failure: Secondary | ICD-10-CM | POA: Diagnosis not present

## 2021-04-03 LAB — ECHOCARDIOGRAM COMPLETE
AR max vel: 1.83 cm2
AV Area VTI: 1.83 cm2
AV Area mean vel: 1.83 cm2
AV Mean grad: 12 mmHg
AV Peak grad: 20.2 mmHg
Ao pk vel: 2.25 m/s
Area-P 1/2: 2.76 cm2
MV VTI: 1.8 cm2
S' Lateral: 2.8 cm

## 2021-04-03 NOTE — Progress Notes (Signed)
Complete echocardiogram performed.  Jimmy Kiana Hollar RDCS, RVT  

## 2021-04-06 DIAGNOSIS — G4734 Idiopathic sleep related nonobstructive alveolar hypoventilation: Secondary | ICD-10-CM | POA: Diagnosis not present

## 2021-04-06 DIAGNOSIS — I5032 Chronic diastolic (congestive) heart failure: Secondary | ICD-10-CM | POA: Diagnosis not present

## 2021-04-06 DIAGNOSIS — R0902 Hypoxemia: Secondary | ICD-10-CM | POA: Diagnosis not present

## 2021-04-08 DIAGNOSIS — H6983 Other specified disorders of Eustachian tube, bilateral: Secondary | ICD-10-CM | POA: Diagnosis not present

## 2021-04-17 ENCOUNTER — Telehealth: Payer: Self-pay

## 2021-04-17 NOTE — Telephone Encounter (Signed)
-----   Message from Richardo Priest, MD sent at 04/17/2021  9:24 AM EDT ----- Valvular mitral stenosis is mild,  Overall good result  Continue her current medications including her diuretic

## 2021-04-17 NOTE — Telephone Encounter (Signed)
Left message on patients voicemail to please return our call.   

## 2021-04-17 NOTE — Telephone Encounter (Signed)
Spoke with patient regarding results and recommendation.  Patient verbalizes understanding and is agreeable to plan of care. Advised patient to call back with any issues or concerns.  

## 2021-05-02 DIAGNOSIS — R739 Hyperglycemia, unspecified: Secondary | ICD-10-CM | POA: Diagnosis not present

## 2021-05-02 DIAGNOSIS — H9193 Unspecified hearing loss, bilateral: Secondary | ICD-10-CM | POA: Diagnosis not present

## 2021-05-02 DIAGNOSIS — Z9981 Dependence on supplemental oxygen: Secondary | ICD-10-CM | POA: Diagnosis not present

## 2021-05-02 DIAGNOSIS — J9611 Chronic respiratory failure with hypoxia: Secondary | ICD-10-CM | POA: Diagnosis not present

## 2021-05-02 DIAGNOSIS — Z6841 Body Mass Index (BMI) 40.0 and over, adult: Secondary | ICD-10-CM | POA: Diagnosis not present

## 2021-05-02 DIAGNOSIS — E785 Hyperlipidemia, unspecified: Secondary | ICD-10-CM | POA: Diagnosis not present

## 2021-05-02 DIAGNOSIS — I10 Essential (primary) hypertension: Secondary | ICD-10-CM | POA: Diagnosis not present

## 2021-05-02 DIAGNOSIS — E039 Hypothyroidism, unspecified: Secondary | ICD-10-CM | POA: Diagnosis not present

## 2021-05-02 DIAGNOSIS — I5032 Chronic diastolic (congestive) heart failure: Secondary | ICD-10-CM | POA: Diagnosis not present

## 2021-05-06 DIAGNOSIS — R0902 Hypoxemia: Secondary | ICD-10-CM | POA: Diagnosis not present

## 2021-05-06 DIAGNOSIS — G4734 Idiopathic sleep related nonobstructive alveolar hypoventilation: Secondary | ICD-10-CM | POA: Diagnosis not present

## 2021-05-06 DIAGNOSIS — I5032 Chronic diastolic (congestive) heart failure: Secondary | ICD-10-CM | POA: Diagnosis not present

## 2021-06-06 DIAGNOSIS — I5032 Chronic diastolic (congestive) heart failure: Secondary | ICD-10-CM | POA: Diagnosis not present

## 2021-06-06 DIAGNOSIS — G4734 Idiopathic sleep related nonobstructive alveolar hypoventilation: Secondary | ICD-10-CM | POA: Diagnosis not present

## 2021-06-06 DIAGNOSIS — R0902 Hypoxemia: Secondary | ICD-10-CM | POA: Diagnosis not present

## 2021-06-10 DIAGNOSIS — Z853 Personal history of malignant neoplasm of breast: Secondary | ICD-10-CM | POA: Diagnosis not present

## 2021-06-10 DIAGNOSIS — R922 Inconclusive mammogram: Secondary | ICD-10-CM | POA: Diagnosis not present

## 2021-06-10 DIAGNOSIS — D0511 Intraductal carcinoma in situ of right breast: Secondary | ICD-10-CM | POA: Diagnosis not present

## 2021-06-26 DIAGNOSIS — Z86000 Personal history of in-situ neoplasm of breast: Secondary | ICD-10-CM | POA: Diagnosis not present

## 2021-07-07 DIAGNOSIS — G4734 Idiopathic sleep related nonobstructive alveolar hypoventilation: Secondary | ICD-10-CM | POA: Diagnosis not present

## 2021-07-07 DIAGNOSIS — R0902 Hypoxemia: Secondary | ICD-10-CM | POA: Diagnosis not present

## 2021-07-07 DIAGNOSIS — I5032 Chronic diastolic (congestive) heart failure: Secondary | ICD-10-CM | POA: Diagnosis not present

## 2021-08-06 DIAGNOSIS — R739 Hyperglycemia, unspecified: Secondary | ICD-10-CM | POA: Diagnosis not present

## 2021-08-06 DIAGNOSIS — Z9981 Dependence on supplemental oxygen: Secondary | ICD-10-CM | POA: Diagnosis not present

## 2021-08-06 DIAGNOSIS — J9611 Chronic respiratory failure with hypoxia: Secondary | ICD-10-CM | POA: Diagnosis not present

## 2021-08-06 DIAGNOSIS — E785 Hyperlipidemia, unspecified: Secondary | ICD-10-CM | POA: Diagnosis not present

## 2021-08-06 DIAGNOSIS — I10 Essential (primary) hypertension: Secondary | ICD-10-CM | POA: Diagnosis not present

## 2021-08-06 DIAGNOSIS — Z6841 Body Mass Index (BMI) 40.0 and over, adult: Secondary | ICD-10-CM | POA: Diagnosis not present

## 2021-08-06 DIAGNOSIS — E039 Hypothyroidism, unspecified: Secondary | ICD-10-CM | POA: Diagnosis not present

## 2021-08-06 DIAGNOSIS — I5032 Chronic diastolic (congestive) heart failure: Secondary | ICD-10-CM | POA: Diagnosis not present

## 2021-08-06 DIAGNOSIS — G4734 Idiopathic sleep related nonobstructive alveolar hypoventilation: Secondary | ICD-10-CM | POA: Diagnosis not present

## 2021-08-06 DIAGNOSIS — D539 Nutritional anemia, unspecified: Secondary | ICD-10-CM | POA: Diagnosis not present

## 2021-08-06 DIAGNOSIS — R0902 Hypoxemia: Secondary | ICD-10-CM | POA: Diagnosis not present

## 2021-08-14 DIAGNOSIS — E538 Deficiency of other specified B group vitamins: Secondary | ICD-10-CM | POA: Diagnosis not present

## 2021-08-21 DIAGNOSIS — E538 Deficiency of other specified B group vitamins: Secondary | ICD-10-CM | POA: Diagnosis not present

## 2021-08-29 DIAGNOSIS — E538 Deficiency of other specified B group vitamins: Secondary | ICD-10-CM | POA: Diagnosis not present

## 2021-09-06 DIAGNOSIS — E538 Deficiency of other specified B group vitamins: Secondary | ICD-10-CM | POA: Diagnosis not present

## 2021-09-13 NOTE — Progress Notes (Signed)
[facility]  [address] [place],  [place]  [address] [phone]  Clinic Day:  09/20/2021  Referring physician: Lowella Dandy, NP  This document serves as a record of services personally performed by Hosie Poisson, MD. It was created on their behalf by Curry,Lauren E, a trained medical scribe. The creation of this record is based on the scribe's personal observations and the provider's statements to them.  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  Current Treatment:    Chemoprevention with raloxifene 60 mg daily   HISTORY OF PRESENT ILLNESS:  Nicole Baldwin is a 80 y.o. 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  Annual screening mammogram in August 2020 revealed a suspicious possible mass of the right breast.  Diagnostic right mammogram and ultrasound confirmed a hypoechoic oval mass with indistinct margins measuring 5 x 4 x 4 mm at 1 o'clock in the right breast, 6 cm from the nipple. Ultrasound guided biopsy in September revealed this to be a papillary carcinoma, at least in situ.  Estrogen receptor was 100% positive and progesterone receptor was 70% positive.  She underwent lumpectomy in October.  Pathology revealed 0.9 cm, intermediate grade, ductal carcinoma in situ with negative margin.  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  She was placed on chemoprevention with raloxifene 60 mg daily in November 2020 and this is planned for 5 years.    She contracted COVID in January 2021 and was hospitalized for 4 days.  She was sent home on oxygen, but was able to be weaned off of this.  Annual bilateral mammogram from August did not reveal any evidence of malignancy.  Bone density scan revealed worsening osteopenia with a  T-score of -1.9 of the left femur neck, previously -1.1, a T-score of -0.3 in the total femur, which is normal, and a T-score of -1.2 in the spine. which was stable.  She is taking calcium 600 mg and vitamin D3 1000 international units daily.  INTERVAL HISTORY:  Nicole Baldwin is here for routine follow up and states that she has been well other than fatigue. She was recently found to be deficient in B12 and has been placed on injections by [facility]. Annual bilateral mammogram from August was clear. She continues raloxifene daily without significant difficulty. Blood counts and chemistries are unremarkable except for a hemoglobin of 10.5, down from 13 in January 2021. Her  appetite is good, and she has gained nearly 2 pounds since her last visit.  She denies fever, chills or other signs of infection.  She denies nausea, vomiting, bowel issues, or abdominal pain.  She denies sore throat, cough, dyspnea, or chest pain.  REVIEW OF SYSTEMS:  Review of Systems  Constitutional:  Positive for fatigue. Negative for appetite change, chills, fever and unexpected weight change.  HENT:  Negative.    Eyes: Negative.   Respiratory: Negative.  Negative for chest tightness, cough, hemoptysis, shortness of breath and wheezing.   Cardiovascular: Negative.  Negative for chest pain, leg swelling and palpitations.  Gastrointestinal: Negative.  Negative for abdominal distention, abdominal pain, blood in stool, constipation, diarrhea, nausea and vomiting.  Endocrine: Negative.   Genitourinary: Negative.  Negative for difficulty urinating, dysuria, frequency and hematuria.  Musculoskeletal: Negative.  Negative for arthralgias, back pain, flank pain, gait problem and myalgias.  Skin: Negative.   Neurological: Negative.  Negative for dizziness, extremity weakness, gait problem, headaches, light-headedness, numbness, seizures and speech difficulty.  Hematological: Negative.   Psychiatric/Behavioral: Negative.  Negative for  depression and sleep disturbance. The patient is not nervous/anxious.     VITALS:  Blood pressure (!) 151/74, pulse 66, temperature 98.2 F (36.8 C), temperature source Oral, resp. rate 18, height 5\' 1"  (1.549 m), weight 233 lb 14.4 oz (106.1 kg), SpO2 93 %.  Wt Readings from Last 3 Encounters:  09/20/21 233 lb 14.4 oz (106.1 kg)  03/22/21 232 lb 1.6 oz (105.3 kg)  03/12/21 231 lb (104.8 kg)    Body mass index is 44.2 kg/m.  Performance status (ECOG): 1 - Symptomatic but completely ambulatory  PHYSICAL EXAM:  Physical Exam Constitutional:      General: She is not in acute distress.    Appearance: Normal appearance. She is normal weight.  HENT:     Head: Normocephalic and atraumatic.  Eyes:     General: No scleral icterus.    Extraocular Movements: Extraocular movements intact.     Conjunctiva/sclera: Conjunctivae normal.     Pupils: Pupils are equal, round, and reactive to light.  Cardiovascular:     Rate and Rhythm: Normal rate and regular rhythm.     Pulses: Normal pulses.     Heart sounds: Normal heart sounds. No murmur heard.   No friction rub. No gallop.  Pulmonary:     Effort: Pulmonary effort is normal. No respiratory distress.     Breath sounds: Normal breath sounds.  Chest:     Comments: Well healed scar in the upper inner quadrant of the right breast. Both breasts are without masses.  Abdominal:     General: Bowel sounds are normal. There is no distension.     Palpations: Abdomen is soft. There is no hepatomegaly, splenomegaly or mass.     Tenderness: There is no abdominal tenderness.  Musculoskeletal:        General: Normal range of motion.     Cervical back: Normal range of motion and neck supple.     Right lower leg: No edema.     Left lower leg: No edema.  Lymphadenopathy:     Cervical: No cervical adenopathy.  Skin:    General: Skin is warm and dry.  Neurological:     General: No focal deficit present.     Mental Status: She is alert and oriented to  person, place, and time. Mental status is at baseline.  Psychiatric:        Mood and Affect: Mood normal.        Behavior: Behavior normal.        Thought Content: Thought content normal.        Judgment: Judgment normal.   LABS:   CBC Latest Ref Rng & Units 09/20/2021 10/31/2019 10/30/2019  WBC - 5.6 7.2 6.7  Hemoglobin 12.0 - 16.0 10.5(A) 10.9(L) 11.1(L)  Hematocrit 36 - 46 31(A) 34.5(L) 34.6(L)  Platelets 150 - 399 210 323 320   CMP Latest Ref Rng & Units 09/20/2021 05/08/2020 04/12/2020  Glucose 65 - 99 mg/dL - 93 84  BUN 4 - 21 12 15 14   Creatinine 0.5 - 1.1 1.0 0.96 0.99  Sodium 137 - 147 138 141 137  Potassium 3.4 - 5.3 3.8 4.0 4.5  Chloride 99 - 108 98(A) 98 95(L)  CO2 13 - 22  36(A) 30(H) 28  Calcium 8.7 - 10.7 8.9 9.2 9.6  Total Protein 6.5 - 8.1 g/dL - - -  Total Bilirubin 0.3 - 1.2 mg/dL - - -  Alkaline Phos 25 - 125 64 - -  AST 13 - 35 26 - -  ALT 7 - 35 18 - -    Lab Results  Component Value Date   FERRITIN 40 10/31/2019   FERRITIN 64 10/30/2019   FERRITIN 91 10/29/2019   Lab Results  Component Value Date   LDH 216 (H) 10/28/2019    STUDIES:  No results found.   EXAM: 06/10/2021 DIGITAL DIAGNOSTIC BILATERAL MAMMOGRAM WITH CAD AND TOMO  COMPARISON: Previous exam(s).  ACR Breast Density Category b: There are scattered areas of fibroglandular density . FINDINGS: 2D and 3D full field views of both breasts and a magnification view of the lumpectomy site demonstrate no suspicious mass, nonsurgical distortion or worrisome calcifications. RIGHT lumpectomy changes are again noted. Mammographic images were processed with CAD.  IMPRESSION: No evidence of breast malignancy.  HISTORY:   Allergies: No Known Allergies  Current Medications: Current Outpatient Medications  Medication Sig Dispense Refill   celecoxib (CELEBREX) 100 MG capsule Take by mouth.     aspirin EC 81 MG tablet Take 81 mg by mouth daily.     Calcium Carb-Cholecalciferol 600-200  MG-UNIT TABS Take 1 tablet by mouth 2 (two) times daily.      carvedilol (COREG) 25 MG tablet Take 25 mg by mouth 2 (two) times daily with a meal.     fluticasone (FLONASE) 50 MCG/ACT nasal spray Place 1 spray into both nostrils in the morning and at bedtime.     levothyroxine (SYNTHROID, LEVOTHROID) 50 MCG tablet Take 50 mcg by mouth daily before breakfast.     lisinopril (PRINIVIL,ZESTRIL) 40 MG tablet Take 40 mg by mouth daily.     Omega-3 Fatty Acids (FISH OIL) 1200 MG CAPS Take 1 capsule by mouth daily.     raloxifene (EVISTA) 60 MG tablet TAKE 1 TABLET EVERY DAY 90 tablet 3   torsemide (DEMADEX) 100 MG tablet Take 100 mg by mouth in the morning.     Torsemide 40 MG TABS Take 40 mg by mouth every evening.     No current facility-administered medications for this visit.     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  She remains without evidence of recurrence.  She knows to continue raloxifene for at least a total of 5 years.    Osteopenia with worsening in the femur in August 2021, for which she continues calcium and vitamin D.  The raloxifene should help her bone density as well. She will be due for repeat bone density in August 2023.   Anemia, with a hemoglobin of 10.5. She states that she has been placed on B12 injections by Laverna Peace, NP, starting with loading dose x4, with plans to continue monthly. She is fatigued. She has follow up with Amy in January.  She knows to continue raloxifene daily. We will plan to see her back in 6 months for re-examination.  The patient understands the plans discussed today and is in agreement with them. She knows to contact our office if she develops issues requiring immediate clinical assessment.  I provided 15 minutes of face-to-face time during this this encounter and > 50% was spent counseling as documented under my assessment and plan.  I, Rita Ohara, am acting  as scribe for Derwood Kaplan, MD  I have reviewed this report as typed by the medical scribe, and it is complete and accurate.

## 2021-09-19 ENCOUNTER — Other Ambulatory Visit: Payer: Self-pay | Admitting: Oncology

## 2021-09-19 DIAGNOSIS — Z86000 Personal history of in-situ neoplasm of breast: Secondary | ICD-10-CM

## 2021-09-20 ENCOUNTER — Encounter: Payer: Self-pay | Admitting: Oncology

## 2021-09-20 ENCOUNTER — Other Ambulatory Visit: Payer: Self-pay | Admitting: Oncology

## 2021-09-20 ENCOUNTER — Inpatient Hospital Stay: Payer: Medicare HMO | Attending: Oncology

## 2021-09-20 ENCOUNTER — Telehealth: Payer: Self-pay | Admitting: Oncology

## 2021-09-20 ENCOUNTER — Inpatient Hospital Stay: Payer: Medicare HMO | Admitting: Oncology

## 2021-09-20 VITALS — BP 151/74 | HR 66 | Temp 98.2°F | Resp 18 | Ht 61.0 in | Wt 233.9 lb

## 2021-09-20 DIAGNOSIS — D649 Anemia, unspecified: Secondary | ICD-10-CM | POA: Diagnosis not present

## 2021-09-20 DIAGNOSIS — D0511 Intraductal carcinoma in situ of right breast: Secondary | ICD-10-CM

## 2021-09-20 DIAGNOSIS — Z86000 Personal history of in-situ neoplasm of breast: Secondary | ICD-10-CM

## 2021-09-20 LAB — BASIC METABOLIC PANEL
BUN: 12 (ref 4–21)
CO2: 36 — AB (ref 13–22)
Chloride: 98 — AB (ref 99–108)
Creatinine: 1 (ref 0.5–1.1)
Glucose: 104
Potassium: 3.8 (ref 3.4–5.3)
Sodium: 138 (ref 137–147)

## 2021-09-20 LAB — CBC AND DIFFERENTIAL
HCT: 31 — AB (ref 36–46)
Hemoglobin: 10.5 — AB (ref 12.0–16.0)
Neutrophils Absolute: 3.36
Platelets: 210 (ref 150–399)
WBC: 5.6

## 2021-09-20 LAB — CBC: RBC: 3.37 — AB (ref 3.87–5.11)

## 2021-09-20 LAB — HEPATIC FUNCTION PANEL
ALT: 18 (ref 7–35)
AST: 26 (ref 13–35)
Alkaline Phosphatase: 64 (ref 25–125)
Bilirubin, Total: 0.5

## 2021-09-20 LAB — COMPREHENSIVE METABOLIC PANEL
Albumin: 3.8 (ref 3.5–5.0)
Calcium: 8.9 (ref 8.7–10.7)

## 2021-09-20 NOTE — Telephone Encounter (Signed)
Per 12/9 LOS, patient scheduled for June 2023 Appt's.  Gave patient Appt Summary

## 2021-09-30 DIAGNOSIS — H6983 Other specified disorders of Eustachian tube, bilateral: Secondary | ICD-10-CM | POA: Diagnosis not present

## 2021-09-30 DIAGNOSIS — Z9622 Myringotomy tube(s) status: Secondary | ICD-10-CM | POA: Diagnosis not present

## 2021-10-08 DIAGNOSIS — E538 Deficiency of other specified B group vitamins: Secondary | ICD-10-CM | POA: Diagnosis not present

## 2021-10-10 DIAGNOSIS — T85618A Breakdown (mechanical) of other specified internal prosthetic devices, implants and grafts, initial encounter: Secondary | ICD-10-CM | POA: Diagnosis not present

## 2021-10-10 DIAGNOSIS — Z9622 Myringotomy tube(s) status: Secondary | ICD-10-CM | POA: Diagnosis not present

## 2021-10-10 DIAGNOSIS — H6983 Other specified disorders of Eustachian tube, bilateral: Secondary | ICD-10-CM | POA: Diagnosis not present

## 2021-10-22 DIAGNOSIS — Z9622 Myringotomy tube(s) status: Secondary | ICD-10-CM | POA: Diagnosis not present

## 2021-10-22 DIAGNOSIS — H6690 Otitis media, unspecified, unspecified ear: Secondary | ICD-10-CM | POA: Diagnosis not present

## 2021-11-03 NOTE — Progress Notes (Signed)
Cardiology Office Note:    Date:  11/04/2021   ID:  Nicole Baldwin, DOB 1941-09-20, MRN [id number]  PCP:  Lowella Dandy, NP  Cardiologist:  Shirlee More, MD    Referring MD: Lowella Dandy, NP    ASSESSMENT:    1. Hypertensive heart disease with heart failure ([place])   2. Left bundle branch block   3. Aortic stenosis, mild   4. Nonrheumatic mitral valve stenosis    PLAN:    In order of problems listed above:  Stable no fluid overload continue current torsemide I told her to move the afternoon dose of the diuretic to to minimize nocturia Stable Stable valvular heart disease most recent echocardiogram   Next appointment: 6 months   Medication Adjustments/Labs and Tests Ordered: Current medicines are reviewed at length with the patient today.  Concerns regarding medicines are outlined above.  No orders of the defined types were placed in this encounter.  No orders of the defined types were placed in this encounter.   Chief Complaint  Patient presents with   Follow-up   Congestive Heart Failure    History of Present Illness:    Nicole Baldwin is a 81 y.o. female with a hx of hypertensive heart disease with chronic diastolic heart failure left bundle branch block mild aortic and mitral stenosis last seen 03/12/2021.  Compliance with diet, lifestyle and medications: Yes  Her weights are stable at home she takes much higher dose of torsemide in the morning with nocturia and difficulty sleeping at night she has chronic shortness of breath no orthopnea edema chest pain or syncope  She had an echocardiogram performed 04/03/2021.  Aortic valve is described as sclerotic no stenosis or regurgitation.  There is mild mitral annular calcification mean gradient 4 mm left ventricle normal in size function with LVH described as severe right ventricle normal size function and pulmonary artery pressure and mild right atrial enlargement.  Recent labs 08/06/2021 cholesterol  209 LDL 133 triglycerides 210 HDL 39 hemoglobin 10.5 creatinine 1.0 potassium 3.3, the BMP was performed 09/21/1999 Past Medical History:  Diagnosis Date   Abdominal distention    Abnormal echocardiogram 02/03/2018   Abnormal weight gain    Acute bronchitis    Adult BMI 37.0-37.9 kg/sq m    Anxiety    At moderate risk for fall    Bilateral edema of lower extremity 02/03/2018   Body mass index 40.0-44.9, adult (HCC)    Chest pain    Chronic diastolic heart failure ([place])    Depression 02/03/2018   Ductal carcinoma in situ (DCIS) of right breast 08/24/2019   Formatting of this note might be different from the original. 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   Calcium Carb-Cholecalciferol 600-200 MG-UNIT TABS Take 1 tablet by mouth 2 (two) times daily.    carvedilol (COREG) 25 MG tablet Take 25 mg by mouth 2 (two) times daily with a meal.   celecoxib  (CELEBREX) 100 MG capsule Take by mouth.   Cyanocobalamin (VITAMIN B-12 IJ) Inject as directed every 30 (thirty) days.   fluticasone (FLONASE) 50 MCG/ACT nasal spray Place 1 spray into both nostrils in the morning and at bedtime.   levothyroxine (SYNTHROID, LEVOTHROID) 50 MCG tablet Take 50 mcg by mouth daily before breakfast.   lisinopril (PRINIVIL,ZESTRIL) 40 MG tablet Take 40 mg by mouth daily.   Omega-3 Fatty Acids (FISH OIL) 1200 MG CAPS Take 1 capsule by mouth daily.   raloxifene (EVISTA) 60 MG tablet TAKE 1 TABLET EVERY DAY   torsemide (DEMADEX) 100 MG tablet Take 100 mg by mouth in the morning.   Torsemide 40 MG TABS Take 40 mg by mouth every evening.     Allergies:   Patient has no known allergies.   Social History   Socioeconomic History   Marital status: Widowed    Spouse name: Not on file   Number of children: Not on file   Years of education: Not on file   Highest education level: Not on file  Occupational History   Not on file  Tobacco Use   Smoking status: Never    Passive exposure: Never   Smokeless tobacco: Never  Vaping Use   Vaping Use: Never used  Substance and Sexual Activity   Alcohol use: Not Currently   Drug use: Not Currently   Sexual activity: Not on file  Other Topics Concern   Not on file  Social History Narrative   Not on file   Social Determinants of Health   Financial Resource Strain: Not on file  Food Insecurity: Not on file  Transportation Needs: Not on file  Physical Activity: Not on file  Stress: Not on file  Social Connections: Not on file     Family History: The patient's family history includes Breast cancer in her mother; Colon cancer in her son; Diabetes in her brother; Hypertension in her father; Stomach cancer in her son; Testicular cancer in her son. ROS:   Please see the history of present illness.    All other systems reviewed and are negative.  EKGs/Labs/Other Studies Reviewed:    The following studies were reviewed  today:    Recent Labs: 09/20/2021: ALT 18; BUN 12; Creatinine 1.0; Hemoglobin 10.5; Platelets 210; Potassium 3.8; Sodium 138  Recent Lipid Panel No results found for: CHOL, TRIG, HDL, CHOLHDL, VLDL, LDLCALC, LDLDIRECT  Physical Exam:    VS:  There were no vitals taken for this visit.    Wt Readings from Last 3 Encounters:  09/20/21 233 lb 14.4 oz (106.1 kg)  03/22/21 232 lb 1.6 oz (105.3 kg)  03/12/21 231 lb (104.8 kg)     GEN:  Well nourished, well developed in no acute distress HEENT: Normal NECK: No JVD; No carotid bruits LYMPHATICS: No lymphadenopathy CARDIAC: RRR, no murmurs, rubs, gallops RESPIRATORY:  Clear to auscultation without rales, wheezing or rhonchi  ABDOMEN: Soft, non-tender, non-distended MUSCULOSKELETAL:  No edema; No deformity  SKIN: Warm and dry NEUROLOGIC:  Alert and oriented x 3 PSYCHIATRIC:  Normal affect    Signed, Shirlee More, MD  11/04/2021 4:04 PM    [facility]

## 2021-11-04 ENCOUNTER — Other Ambulatory Visit: Payer: Self-pay

## 2021-11-04 ENCOUNTER — Ambulatory Visit: Payer: Medicare HMO | Admitting: Cardiology

## 2021-11-04 ENCOUNTER — Encounter: Payer: Self-pay | Admitting: Cardiology

## 2021-11-04 VITALS — BP 146/64 | HR 68 | Ht 61.0 in | Wt 233.0 lb

## 2021-11-04 DIAGNOSIS — I110 Hypertensive heart disease with heart failure: Secondary | ICD-10-CM | POA: Diagnosis not present

## 2021-11-04 DIAGNOSIS — I342 Nonrheumatic mitral (valve) stenosis: Secondary | ICD-10-CM

## 2021-11-04 DIAGNOSIS — I350 Nonrheumatic aortic (valve) stenosis: Secondary | ICD-10-CM | POA: Diagnosis not present

## 2021-11-04 DIAGNOSIS — I447 Left bundle-branch block, unspecified: Secondary | ICD-10-CM

## 2021-11-04 NOTE — Patient Instructions (Signed)

## 2021-11-08 DIAGNOSIS — E538 Deficiency of other specified B group vitamins: Secondary | ICD-10-CM | POA: Diagnosis not present

## 2021-11-12 DIAGNOSIS — E039 Hypothyroidism, unspecified: Secondary | ICD-10-CM | POA: Diagnosis not present

## 2021-11-12 DIAGNOSIS — J9611 Chronic respiratory failure with hypoxia: Secondary | ICD-10-CM | POA: Diagnosis not present

## 2021-11-12 DIAGNOSIS — E785 Hyperlipidemia, unspecified: Secondary | ICD-10-CM | POA: Diagnosis not present

## 2021-11-12 DIAGNOSIS — Z9981 Dependence on supplemental oxygen: Secondary | ICD-10-CM | POA: Diagnosis not present

## 2021-11-12 DIAGNOSIS — I10 Essential (primary) hypertension: Secondary | ICD-10-CM | POA: Diagnosis not present

## 2021-11-12 DIAGNOSIS — D539 Nutritional anemia, unspecified: Secondary | ICD-10-CM | POA: Diagnosis not present

## 2021-11-12 DIAGNOSIS — Z6841 Body Mass Index (BMI) 40.0 and over, adult: Secondary | ICD-10-CM | POA: Diagnosis not present

## 2021-11-12 DIAGNOSIS — R739 Hyperglycemia, unspecified: Secondary | ICD-10-CM | POA: Diagnosis not present

## 2021-11-12 DIAGNOSIS — I5032 Chronic diastolic (congestive) heart failure: Secondary | ICD-10-CM | POA: Diagnosis not present

## 2021-11-22 DIAGNOSIS — M79672 Pain in left foot: Secondary | ICD-10-CM | POA: Diagnosis not present

## 2021-12-03 ENCOUNTER — Other Ambulatory Visit: Payer: Self-pay

## 2021-12-03 ENCOUNTER — Encounter: Payer: Self-pay | Admitting: Pulmonary Disease

## 2021-12-03 ENCOUNTER — Ambulatory Visit: Payer: Medicare HMO | Admitting: Pulmonary Disease

## 2021-12-03 VITALS — BP 108/60 | HR 68 | Ht 61.0 in | Wt 231.0 lb

## 2021-12-03 DIAGNOSIS — R0609 Other forms of dyspnea: Secondary | ICD-10-CM | POA: Diagnosis not present

## 2021-12-03 DIAGNOSIS — G4734 Idiopathic sleep related nonobstructive alveolar hypoventilation: Secondary | ICD-10-CM

## 2021-12-03 NOTE — Progress Notes (Signed)
@Patient  ID: Nicole Baldwin, female    DOB: Feb 24, 1941, 81 y.o.   MRN: [id number]  Chief Complaint  Patient presents with   Follow-up    [place], o2 at night    Referring provider: Lowella Dandy, NP  HPI:  Ms. Nicole Baldwin is a 81 y.o. woman with past medical history of mitral regurg, aortic stenosis, and COVID-19 infection with acute hypoxic respiratory failure 10/2019 now no longer using oxygen during the day, using 2 L nocturnal oxygen whom we are seeing in follow-up for dyspnea on exertion..  Overall, she is doing okay.  Still quite dyspneic.  Maybe slightly better since last visit about 18 months ago.  Present on flat surfaces.  Worse with stairs.  Not very active.  Walking a [place] bit on the deck around the house but that small distance even provides a lot of dyspnea.  Admittedly she is less active during the colder months than she was before.  Prior she was doing some exercises on the porch, walking on flat surfaces in the driveway etc.  Has not been doing much of late.  She continues oxygen at night.  2 L nasal cannula.  Usually when she is getting in the bed is around 89 to 90%.  Has not seen a drop.  Daytime oxygen has stayed up in the mid to high 90s.  HPI initial visit: She does not symptoms really started shortly after hospitalization for COVID-19 in 10/2019.  Notes reviewed from hospitalization.  She was hypoxemic to 80s on room air so EMS was called.  She was kept in the hospital for a few days on supplemental oxygen.  Her oxygen requirement slowly improved to 1 to 2 L during the day.  She was discharged to complete oral medicines to treat COVID-19.  In interim, she has had waxing and waning of her symptoms.  She and son state that when she was about 10 to 15 pounds lighter, she came completely off oxygen both during the day and at night.  In addition, her dyspnea on exertion seemed improved.  She was walking 5 "laps" up and down the driveway at the time up to 20 laps during the  day.  Over the last several months she is slowly regaining weight and is less active walking less during the day.  She checks her oxygen saturations during the day and gets as low as 90-91 with exertion without supplemental oxygen.  At night, her son is checking her oxygen and she is wearing 2 L nasal cannula to maintain saturations between 93 and 97%.  He states that oxygen saturations are better when she is propped up sitting up in bed as opposed to when she slides down and is more supine.  She sleeps with 2 pillows.  There is a chest x-ray dated 04/20/2020 with report scanned into EMR (cannot review images) with redness and improvement in bilateral airspace opacities compared to 10/2019 at time of hospitalization.  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  MMRC: No flowsheet data found.  Epworth:  No flowsheet data found.  Tests:   FENO:  No results found for: NITRICOXIDE  PFT: No flowsheet data found.  WALK:  No flowsheet data found.  Imaging: Chest x-ray 10/2019 with my interpretation as follows: Good inspiration, bilateral infiltrates in lower to mid lung fields  Lab Results: Personally reviewed CBC    Component Value Date/Time   WBC 5.6 09/20/2021 0000   WBC 7.2 10/31/2019 0340   RBC 3.37 (A) 09/20/2021 0000   HGB 10.5 (A) 09/20/2021 0000   HCT 31 (A) 09/20/2021 0000   PLT 210 09/20/2021 0000   MCV 93.0 10/31/2019 0340   MCH 29.4 10/31/2019 0340   MCHC 31.6 10/31/2019 0340   RDW 13.6 10/31/2019 0340   LYMPHSABS 1.3 10/31/2019 0340   MONOABS 1.0 10/31/2019 0340   EOSABS 0.0 10/31/2019 0340   BASOSABS 0.0 10/31/2019 0340    BMET    Component  Value Date/Time   NA 138 09/20/2021 0000   K 3.8 09/20/2021 0000   CL 98 (A) 09/20/2021 0000   CO2 36 (A) 09/20/2021 0000   GLUCOSE 93 05/08/2020 1608   GLUCOSE 89 10/31/2019 0340   BUN 12 09/20/2021 0000   CREATININE 1.0 09/20/2021 0000   CREATININE 0.96 05/08/2020 1608   CALCIUM 8.9 09/20/2021 0000   GFRNONAA 56 (L) 05/08/2020 1608   GFRAA 65 05/08/2020 1608    BNP No results found for: BNP  ProBNP    Component Value Date/Time   PROBNP 95 04/12/2020 1507    Specialty Problems       Pulmonary Problems   Pneumonia due to COVID-19 virus   Acute bronchitis   Dyspnea on effort   Nocturnal hypoxia    No Known Allergies   There is no immunization history on file for this patient.  Past Medical History:  Diagnosis Date   Abdominal distention    Abnormal echocardiogram 02/03/2018   Abnormal weight gain    Acute bronchitis    Adult BMI 37.0-37.9 kg/sq m    Anxiety    At moderate risk for fall    Bilateral edema of lower extremity 02/03/2018   Body mass index 40.0-44.9, adult (HCC)    Chest pain    Chronic diastolic heart failure ([place])    Depression 02/03/2018   Ductal carcinoma in situ (DCIS) of right breast 08/24/2019   Formatting of this note might be different from the original. 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   Calcium Carb-Cholecalciferol 600-200 MG-UNIT TABS Take 1 tablet by mouth  2 (two) times daily.    carvedilol (COREG) 25 MG tablet Take 25 mg by mouth 2 (two) times daily with a meal.   celecoxib (CELEBREX) 100 MG capsule Take by mouth.   Cyanocobalamin (VITAMIN B-12 IJ) Inject as directed every 30 (thirty) days.   fluticasone (FLONASE) 50 MCG/ACT nasal spray Place 1 spray into both nostrils in the morning and at bedtime.   levothyroxine (SYNTHROID, LEVOTHROID) 50 MCG tablet Take 50 mcg by mouth daily before breakfast.   lisinopril (PRINIVIL,ZESTRIL) 40 MG tablet Take 40 mg by mouth daily.   Omega-3 Fatty Acids (FISH OIL) 1200 MG CAPS Take 1 capsule by mouth daily.   raloxifene (EVISTA) 60 MG tablet TAKE 1 TABLET EVERY DAY   torsemide (DEMADEX) 100 MG tablet Take 100 mg by mouth in the morning. (Patient not taking: Reported on 12/03/2021)   Torsemide 40 MG TABS Take 40 mg by mouth every evening. (Patient not taking: Reported on 12/03/2021)   No facility-administered encounter medications on file as of 12/03/2021.     Review of Systems  Review of Systems  No chest pain with exertion, no orthopnea or PND.  No reported witnessed apneas.  Comprehensive review of systems otherwise negative.  Physical Exam  BP 108/60 (BP Location: Right Arm, Cuff Size: Large)    Pulse 68    Ht 5\' 1"  (1.549 m)    Wt 231 lb (104.8 kg)    SpO2 92%    BMI 43.65 kg/m   Wt Readings from Last 5 Encounters:  12/03/21 231 lb (104.8 kg)  11/04/21 233 lb (105.7 kg)  09/20/21 233 lb 14.4 oz (106.1 kg)  03/22/21 232 lb 1.6 oz  (105.3 kg)  03/12/21 231 lb (104.8 kg)    BMI Readings from Last 5 Encounters:  12/03/21 43.65 kg/m  11/04/21 44.02 kg/m  09/20/21 44.20 kg/m  03/22/21 43.85 kg/m  03/12/21 43.65 kg/m     Physical Exam General: Well-appearing, sitting up in exam table Eyes: EOMI, no icterus Neck: Supple, no JVP appreciated Respiratory: Clear to auscultation bilaterally, no wheezes or crackles Cardiovascular: Regular rate and rhythm, no murmurs, no to trace lower extremity edema at ankles Abdomen: Soft, bowel sounds present MSK: No synovitis, no joint effusion Neuro: Normal gait, no weakness Psych: Normal mood, full affect    Assessment & Plan:   Dyspnea exertion: Likely multifactorial related to cardiac abnormalities given history of mitral regurg, aortic stenosis, obesity/OHS and given chronic elevated bicarbonate in the past and need for nocturnal oxygen.  Also, high suspicion for deconditioning.  PFTs ordered today for further evaluation.  Abnormal chest x-ray 10/2019 with report 04/2020 obtained via PCP the bilateral cultures had improved.  Possible post-COVID scarring no report of improved chest x-ray would argue against this.  Nocturnal hypoxemia: largely related to obesity given she was off oxygen and symptomatically improved at a lower weight and given elevated bicarb on prior BMP suggestive of possible OHS.  Repeat overnight oximetry on 2 L ordered today.  If normal, would consider repeat off oxygen but want to make sure she is not desaturating on current oxygen therapy.  Discussed at last visit and today the desire to evaluate for OSA.  However, she could not tolerate going to a sleep lab due to her sleep habits and she is dubious she would wear CPAP.  Therefore no role for polysomnography at this time.    Return in about 3 months (around 03/02/2022).   Lanier Clam, MD 12/03/2021

## 2021-12-03 NOTE — Patient Instructions (Signed)
Nice to see you again  We will get pulmonary function tests soon to help [place] better understand the shortness of breath  We will repeat the oxygen test at night to see if you drop the oxygen number even when wearing the oxygen.  Return to clinic in 3 months or sooner as needed

## 2021-12-09 DIAGNOSIS — E538 Deficiency of other specified B group vitamins: Secondary | ICD-10-CM | POA: Diagnosis not present

## 2022-01-13 DIAGNOSIS — E538 Deficiency of other specified B group vitamins: Secondary | ICD-10-CM | POA: Diagnosis not present

## 2022-01-24 DIAGNOSIS — H6692 Otitis media, unspecified, left ear: Secondary | ICD-10-CM | POA: Diagnosis not present

## 2022-02-03 DIAGNOSIS — S00412A Abrasion of left ear, initial encounter: Secondary | ICD-10-CM | POA: Diagnosis not present

## 2022-02-03 DIAGNOSIS — H7291 Unspecified perforation of tympanic membrane, right ear: Secondary | ICD-10-CM | POA: Diagnosis not present

## 2022-02-03 DIAGNOSIS — T161XXA Foreign body in right ear, initial encounter: Secondary | ICD-10-CM | POA: Diagnosis not present

## 2022-02-10 DIAGNOSIS — Z139 Encounter for screening, unspecified: Secondary | ICD-10-CM | POA: Diagnosis not present

## 2022-02-10 DIAGNOSIS — M109 Gout, unspecified: Secondary | ICD-10-CM | POA: Diagnosis not present

## 2022-02-10 DIAGNOSIS — I10 Essential (primary) hypertension: Secondary | ICD-10-CM | POA: Diagnosis not present

## 2022-02-10 DIAGNOSIS — D539 Nutritional anemia, unspecified: Secondary | ICD-10-CM | POA: Diagnosis not present

## 2022-02-10 DIAGNOSIS — J9611 Chronic respiratory failure with hypoxia: Secondary | ICD-10-CM | POA: Diagnosis not present

## 2022-02-10 DIAGNOSIS — Z9981 Dependence on supplemental oxygen: Secondary | ICD-10-CM | POA: Diagnosis not present

## 2022-02-10 DIAGNOSIS — E785 Hyperlipidemia, unspecified: Secondary | ICD-10-CM | POA: Diagnosis not present

## 2022-02-10 DIAGNOSIS — E039 Hypothyroidism, unspecified: Secondary | ICD-10-CM | POA: Diagnosis not present

## 2022-02-10 DIAGNOSIS — I5032 Chronic diastolic (congestive) heart failure: Secondary | ICD-10-CM | POA: Diagnosis not present

## 2022-02-10 DIAGNOSIS — R739 Hyperglycemia, unspecified: Secondary | ICD-10-CM | POA: Diagnosis not present

## 2022-02-17 DIAGNOSIS — E538 Deficiency of other specified B group vitamins: Secondary | ICD-10-CM | POA: Diagnosis not present

## 2022-02-21 DIAGNOSIS — H6983 Other specified disorders of Eustachian tube, bilateral: Secondary | ICD-10-CM | POA: Diagnosis not present

## 2022-02-21 DIAGNOSIS — H6593 Unspecified nonsuppurative otitis media, bilateral: Secondary | ICD-10-CM | POA: Diagnosis not present

## 2022-02-21 DIAGNOSIS — H9190 Unspecified hearing loss, unspecified ear: Secondary | ICD-10-CM | POA: Diagnosis not present

## 2022-02-21 DIAGNOSIS — T162XXA Foreign body in left ear, initial encounter: Secondary | ICD-10-CM | POA: Diagnosis not present

## 2022-03-11 ENCOUNTER — Ambulatory Visit: Payer: Medicare HMO | Admitting: Pulmonary Disease

## 2022-03-14 DIAGNOSIS — R109 Unspecified abdominal pain: Secondary | ICD-10-CM | POA: Diagnosis not present

## 2022-03-14 DIAGNOSIS — D539 Nutritional anemia, unspecified: Secondary | ICD-10-CM | POA: Diagnosis not present

## 2022-03-14 DIAGNOSIS — Z9181 History of falling: Secondary | ICD-10-CM | POA: Diagnosis not present

## 2022-03-14 DIAGNOSIS — A084 Viral intestinal infection, unspecified: Secondary | ICD-10-CM | POA: Diagnosis not present

## 2022-03-18 ENCOUNTER — Other Ambulatory Visit: Payer: Self-pay

## 2022-03-20 DIAGNOSIS — E538 Deficiency of other specified B group vitamins: Secondary | ICD-10-CM | POA: Diagnosis not present

## 2022-03-20 NOTE — Progress Notes (Signed)
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  She was placed on chemoprevention with raloxifene 60 mg daily in November 2020 and this is planned for 5 years.  Bilateral diagnostic mammogram in August 2022 did not reveal any evidence of malignancy.  She remains without evidence of disease on examination today.  She knows to continue raloxifene daily.  As her annual mammogram in August is not scheduled yet, I will go ahead and do that.  We will plan to see her back in 6 months for reexamination.  Osteopenia after menopause Osteopenia on bone density scan in August 2021.  She continues calcium and vitamin D.  Raloxifene will also help her osteopenia.  She is due for a repeat bone density in August, so I will get that scheduled.    The patient understands the plans discussed today and is in agreement with them.  She knows to contact our office if she develops concerns prior to her next appointment.   I provided 15 minutes of face-to-face time during this encounter and > 50% was spent counseling as documented under my assessment and plan.    Nicole Pickles, PA-C  [facility] [address] [place] [address] Dept: [phone] Dept Fax: [phone]   Orders Placed This Encounter  Procedures   DG Bone Density    Standing Status:   Future    Standing Expiration Date:   03/22/2023    Scheduling Instructions:     RH    Order Specific Question:   Reason for Exam (SYMPTOM  OR DIAGNOSIS REQUIRED)    Answer:   osteopenia    Order Specific Question:   Preferred imaging location?    Answer:   External    MM Digital Diagnostic Bilat    Standing Status:   Future    Standing Expiration Date:   03/22/2023    Scheduling Instructions:     RH    Order Specific Question:   Reason for exam:    Answer:   h/o right breast cancer, screening for breast cancer    Order Specific Question:   Preferred imaging location?    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  Annual screening mammogram in August 2020 revealed a suspicious possible mass of the right breast.  Diagnostic right mammogram and ultrasound confirmed a hypoechoic oval mass with indistinct margins measuring 5 x 4 x 4 mm at 1 o'clock in the right breast, 6 cm from the nipple. Ultrasound guided biopsy in September revealed this to be a papillary carcinoma, at least in situ.  Estrogen receptor was 100% positive and progesterone receptor was 70% positive.  She underwent lumpectomy in October.  Pathology revealed 0.9 cm, intermediate grade, ductal carcinoma in situ with negative margin.  Bone density scan  was in June 2019 revealed mild osteopenia of the spine and femur with a T-score of -1.2 and -1.6 respectively.  She was placed on chemoprevention with raloxifene 60 mg daily in November 2020 and we plan for a total of 5 years.    Annual bilateral mammogram in August 2021 did not reveal any evidence of malignancy.  Bone density scan revealed worsening osteopenia with a T-score of -1.9 of the left femur neck, previously -1.1, a T-score of -0.3 in the total femur, which is normal, and a T-score of -1.2 in the spine. which was stable.  She is taking calcium 600 mg and vitamin D3 1000 international units daily.  Raloxifene will also treat  her osteopenia.   Oncology History  Ductal carcinoma in situ (DCIS) of right breast  07/03/2019 Cancer Staging   Staging form: Breast, AJCC 8th Edition - Clinical stage from 07/03/2019: Stage 0 (cTis (DCIS), cN0, cM0, G2, ER+, PR+, HER2: Not Assessed) - Signed by Derwood Kaplan, MD on 11/12/2020 Histopathologic type: Intraductal carcinoma, noninfiltrating, NOS Stage prefix: Initial diagnosis Method of lymph node assessment: Clinical Nuclear grade: G2 Multigene prognostic tests performed: None Menopausal status: Postmenopausal Stage used in treatment planning: Yes National guidelines used in treatment planning: Yes Type of national guideline used in treatment planning: NCCN Staging comments: Lumpectomy and raloxifene   08/24/2019 Initial Diagnosis   Ductal carcinoma in situ (DCIS) of right breast       INTERVAL HISTORY:  Nicole Baldwin is here today for repeat clinical assessment.  She states she continues raloxifene daily without difficulty.  She denies hot flashes, vaginal discharge or bleeding.  She denies any changes in her breasts.  She has been fatigued.  She states Nicole Peace, NP has her on B12 injections and oral iron and has asked her to do stool Hemoccult cards.  She is not sure when her last colonoscopy was she does get short of breath and has oxygen to wear at night.  She denies chest pain or palpitations.  She states she had a stomach virus a couple weeks ago with nausea, vomiting and diarrhea which resolved on its own.  She denies fevers or chills. She denies pain. Her appetite is good. Her weight has been stable.  She continues to follow-up with Nicole Baldwin but has not been scheduled for mammogram in August yet.  REVIEW OF SYSTEMS:  Review of Systems  Constitutional:  Negative for appetite change, chills, fatigue, fever and unexpected weight change.  HENT:   Negative for lump/mass, mouth sores and sore throat.   Respiratory:  Negative for cough and shortness of breath.    Cardiovascular:  Negative for chest pain and leg swelling.  Gastrointestinal:  Negative for abdominal pain, constipation, diarrhea, nausea and vomiting.  Endocrine: Negative for hot flashes.  Genitourinary:  Negative for difficulty urinating, dysuria, frequency and hematuria.   Musculoskeletal:  Negative for arthralgias, back pain and myalgias.  Skin:  Negative for rash.  Neurological:  Negative for dizziness and headaches.  Hematological:  Negative for adenopathy. Does not bruise/bleed easily.  Psychiatric/Behavioral:  Negative for depression and sleep disturbance. The patient is not nervous/anxious.      VITALS:  Blood pressure 120/60, pulse 67, temperature 98.3 F (36.8 C), temperature source Oral, resp. rate 18, height 5' 1" (1.549 m), weight 231 lb 1.6 oz (104.8 kg), SpO2 91 %.  Wt Readings from Last 3 Encounters:  03/21/22 231 lb 1.6 oz (104.8 kg)  12/03/21 231 lb (104.8 kg)  11/04/21 233 lb (105.7  kg)    Body mass index is 43.67 kg/m.  Performance status (ECOG): 2 - Symptomatic, <50% confined to bed  PHYSICAL EXAM:  Physical Exam Vitals and nursing note reviewed.  Constitutional:      General: She is not in acute distress.    Appearance: Normal appearance.  HENT:     Head: Normocephalic and atraumatic.     Mouth/Throat:     Mouth: Mucous membranes are moist.     Pharynx: Oropharynx is clear. No oropharyngeal exudate or posterior oropharyngeal erythema.  Eyes:     General: No scleral icterus.    Extraocular Movements: Extraocular movements intact.     Conjunctiva/sclera: Conjunctivae normal.     Pupils: Pupils are equal, round, and reactive to light.  Cardiovascular:     Rate and Rhythm: Normal rate and regular rhythm.     Heart sounds: Normal heart sounds. No murmur heard.    No friction rub. No gallop.  Pulmonary:     Effort: Pulmonary effort is normal.     Breath sounds: Normal breath sounds. No wheezing, rhonchi or rales.  Chest:  Breasts:    Right:  Normal. No swelling, bleeding, inverted nipple, mass, nipple discharge, skin change or tenderness.     Left: Normal. No swelling, bleeding, inverted nipple, mass, nipple discharge, skin change or tenderness.  Abdominal:     General: There is no distension.     Palpations: Abdomen is soft. There is no hepatomegaly, splenomegaly or mass.     Tenderness: There is no abdominal tenderness.  Musculoskeletal:        General: Normal range of motion.     Cervical back: Normal range of motion and neck supple. No tenderness.     Right lower leg: No edema.     Left lower leg: No edema.  Lymphadenopathy:     Cervical: No cervical adenopathy.     Upper Body:     Right upper body: No supraclavicular or axillary adenopathy.     Left upper body: No supraclavicular or axillary adenopathy.     Lower Body: No right inguinal adenopathy. No left inguinal adenopathy.  Skin:    General: Skin is warm and dry.     Coloration: Skin is not jaundiced.     Findings: No rash.  Neurological:     Mental Status: She is alert and oriented to person, place, and time.     Cranial Nerves: No cranial nerve deficit.  Psychiatric:        Mood and Affect: Mood normal.        Behavior: Behavior normal.        Thought Content: Thought content normal.     LABS:      Latest Ref Rng & Units 09/20/2021   12:00 AM 10/31/2019    3:40 AM 10/30/2019    2:40 AM  CBC  WBC  5.6  7.2  6.7   Hemoglobin 12.0 - 16.0 10.5  10.9  11.1   Hematocrit 36 - 46 31  34.5  34.6   Platelets 150 - 399 210  323  320       Latest Ref Rng & Units 09/20/2021   12:00 AM 05/08/2020    4:08 PM 04/12/2020    3:07 PM  CMP  Glucose 65 - 99 mg/dL  93  84   BUN 4 - _0 Creatinine 0.5 - 1.1 1.0  0.96  0.99   Sodium 137 - 147 138  141  137   Potassium 3.4 - 5.3 3.8  4.0  4.5   Chloride 99 - 108 98  98  95   CO2 13 - 22 36  30  28   Calcium 8.7 - 10.7 8.9  9.2  9.6   Alkaline Phos 25 - 125 64     AST 13 - 35 26     ALT 7 - 35 18         No results found for: "CEA1", "CEA" / No results found for: "CEA1", "CEA" No results found for: "PSA1" No results found for: "DGU440" No results found for: "CAN125"  No results found for: "TOTALPROTELP", "ALBUMINELP", "A1GS", "A2GS", "BETS", "BETA2SER", "GAMS", "MSPIKE", "SPEI" Lab Results  Component Value Date   FERRITIN 40 10/31/2019   FERRITIN 64 10/30/2019   FERRITIN 91 10/29/2019   Lab Results  Component Value Date   LDH 216 (H) 10/28/2019    STUDIES:  No results found.    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   Mitral regurgitation 04/06/2019   Mitral stenosis 10/12/2018   Nocturnal hypoxia    Osteopenia after menopause 03/21/2022   Otitis media, acute serous    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 She has never been exposed to tobacco smoke. She has never used smokeless tobacco. She reports that she does not currently use alcohol. She reports that she does not currently use drugs.The patient is alone today.  Allergies: No Known Allergies  Current Medications: Current Outpatient Medications  Medication Sig Dispense Refill   allopurinol (ZYLOPRIM) 100 MG tablet Take by mouth.     aspirin EC 81 MG tablet Take 81 mg  by mouth daily.     Calcium Carb-Cholecalciferol 600-200 MG-UNIT TABS Take 1 tablet by mouth 2 (two) times daily.      carvedilol (COREG) 25 MG tablet Take 25 mg by mouth 2 (two) times daily with a meal.     celecoxib (CELEBREX) 100 MG capsule Take by mouth.     cyanocobalamin (,VITAMIN B-12,) 1000 MCG/ML injection every 30 (thirty) days.     ferrous sulfate 324 (65 Fe) MG TBEC Take by mouth.     fluticasone (FLONASE) 50 MCG/ACT nasal spray Place 1 spray into both nostrils in the morning and at bedtime.     levothyroxine (SYNTHROID, LEVOTHROID) 50 MCG tablet Take 50 mcg by mouth daily before breakfast.     lisinopril (PRINIVIL,ZESTRIL) 40 MG tablet Take 40 mg by mouth daily.     Omega-3 Fatty Acids (FISH OIL) 1200 MG CAPS Take 1 capsule by mouth daily.     predniSONE (DELTASONE) 10 MG tablet Take by mouth.     raloxifene (EVISTA) 60 MG tablet TAKE 1 TABLET EVERY DAY 90 tablet 3   torsemide (DEMADEX) 100 MG tablet Take 100 mg by mouth in the morning. (Patient not taking: Reported on 12/03/2021)     torsemide (DEMADEX) 20 MG tablet Take 20 mg by mouth 2 (two) times daily.     Torsemide 40 MG TABS Take 40 mg by  mouth every evening. (Patient not taking: Reported on 12/03/2021)     No current facility-administered medications for this visit.

## 2022-03-21 ENCOUNTER — Other Ambulatory Visit: Payer: Medicare HMO

## 2022-03-21 ENCOUNTER — Encounter: Payer: Self-pay | Admitting: Hematology and Oncology

## 2022-03-21 ENCOUNTER — Inpatient Hospital Stay: Payer: Medicare HMO | Attending: Hematology and Oncology | Admitting: Hematology and Oncology

## 2022-03-21 DIAGNOSIS — D509 Iron deficiency anemia, unspecified: Secondary | ICD-10-CM

## 2022-03-21 DIAGNOSIS — Z780 Asymptomatic menopausal state: Secondary | ICD-10-CM | POA: Diagnosis not present

## 2022-03-21 DIAGNOSIS — D0511 Intraductal carcinoma in situ of right breast: Secondary | ICD-10-CM | POA: Diagnosis not present

## 2022-03-21 DIAGNOSIS — M8580 Other specified disorders of bone density and structure, unspecified site: Secondary | ICD-10-CM | POA: Diagnosis not present

## 2022-03-21 DIAGNOSIS — E538 Deficiency of other specified B group vitamins: Secondary | ICD-10-CM

## 2022-03-21 HISTORY — DX: Other specified disorders of bone density and structure, unspecified site: M85.80

## 2022-03-21 NOTE — Assessment & Plan Note (Addendum)
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  She was placed on chemoprevention with raloxifene 60 mg daily in November 2020 and this isplanned for 5 years.  Bilateral diagnostic mammogram in August 2022 did not reveal any evidence of malignancy.  She remains without evidence of disease on examination today.  She knows to continue raloxifene daily.  As her annual mammogram in August is not scheduled yet, I will go ahead and do that.  We will plan to see her back in 6 months for reexamination.

## 2022-03-21 NOTE — Assessment & Plan Note (Signed)
Osteopenia on bone density scan in August 2021.  She continues calcium and vitamin D.  Raloxifene will also help her osteopenia.  She is due for a repeat bone density in August, so I will get that scheduled.

## 2022-04-03 DIAGNOSIS — H6593 Unspecified nonsuppurative otitis media, bilateral: Secondary | ICD-10-CM | POA: Diagnosis not present

## 2022-04-03 DIAGNOSIS — H6983 Other specified disorders of Eustachian tube, bilateral: Secondary | ICD-10-CM | POA: Diagnosis not present

## 2022-04-03 DIAGNOSIS — H9190 Unspecified hearing loss, unspecified ear: Secondary | ICD-10-CM | POA: Diagnosis not present

## 2022-04-18 ENCOUNTER — Ambulatory Visit: Payer: Medicare HMO | Admitting: Pulmonary Disease

## 2022-04-18 ENCOUNTER — Encounter: Payer: Self-pay | Admitting: Pulmonary Disease

## 2022-04-18 ENCOUNTER — Ambulatory Visit (INDEPENDENT_AMBULATORY_CARE_PROVIDER_SITE_OTHER): Payer: Medicare HMO | Admitting: Pulmonary Disease

## 2022-04-18 VITALS — BP 136/74 | HR 84 | Temp 98.7°F | Ht 62.0 in | Wt 232.2 lb

## 2022-04-18 DIAGNOSIS — R0989 Other specified symptoms and signs involving the circulatory and respiratory systems: Secondary | ICD-10-CM

## 2022-04-18 DIAGNOSIS — R0609 Other forms of dyspnea: Secondary | ICD-10-CM

## 2022-04-18 LAB — PULMONARY FUNCTION TEST
DL/VA % pred: 96 %
DL/VA: 3.97 ml/min/mmHg/L
DLCO cor % pred: 65 %
DLCO cor: 11.43 ml/min/mmHg
DLCO unc % pred: 65 %
DLCO unc: 11.43 ml/min/mmHg
FEF 25-75 Post: 1.04 L/sec
FEF 25-75 Pre: 1.02 L/sec
FEF2575-%Change-Post: 2 %
FEF2575-%Pred-Post: 83 %
FEF2575-%Pred-Pre: 81 %
FEV1-%Change-Post: 1 %
FEV1-%Pred-Post: 56 %
FEV1-%Pred-Pre: 55 %
FEV1-Post: 0.97 L
FEV1-Pre: 0.96 L
FEV1FVC-%Change-Post: 5 %
FEV1FVC-%Pred-Pre: 112 %
FEV6-%Change-Post: -3 %
FEV6-%Pred-Post: 51 %
FEV6-%Pred-Pre: 53 %
FEV6-Post: 1.12 L
FEV6-Pre: 1.17 L
FEV6FVC-%Pred-Post: 106 %
FEV6FVC-%Pred-Pre: 106 %
FVC-%Change-Post: -3 %
FVC-%Pred-Post: 48 %
FVC-%Pred-Pre: 50 %
FVC-Post: 1.12 L
FVC-Pre: 1.17 L
Post FEV1/FVC ratio: 87 %
Post FEV6/FVC ratio: 100 %
Pre FEV1/FVC ratio: 82 %
Pre FEV6/FVC Ratio: 100 %
RV % pred: 135 %
RV: 3.14 L
TLC % pred: 100 %
TLC: 4.77 L

## 2022-04-18 MED ORDER — STIOLTO RESPIMAT 2.5-2.5 MCG/ACT IN AERS
2.0000 | INHALATION_SPRAY | Freq: Every day | RESPIRATORY_TRACT | 11 refills | Status: DC
Start: 2022-04-18 — End: 2023-04-14

## 2022-04-18 NOTE — Progress Notes (Signed)
$'@Patient'e$  ID: Nicole Baldwin, female    DOB: 11-23-1940, 81 y.o.   MRN: [id number]  Chief Complaint  Patient presents with   Follow-up    Pt states she is about the same with breathing. Still on 2L of O2 at sleep.    Referring provider: Lowella Dandy, NP  HPI:  Nicole Baldwin is a 81 y.o. woman with past medical history of mitral regurg, aortic stenosis, and COVID-19 infection with acute hypoxic respiratory failure 10/2019 now no longer using oxygen during the day, using 2 L nocturnal oxygen whom we are seeing in follow-up for dyspnea on exertion.  Overall, she is doing okay.  Still quite dyspneic.  PFTs performed today.  Reviewed and interpreted as below.  Main abnormality air trapping as well as moderately reduced DLCO however this is likely underestimated given TLC use for value is almost 2 L lower than measured TLC on lung volumes.  Discussed role and rationale for inhaler therapy given air trapping.  HPI initial visit: She does not symptoms really started shortly after hospitalization for COVID-19 in 10/2019.  Notes reviewed from hospitalization.  She was hypoxemic to 80s on room air so EMS was called.  She was kept in the hospital for a few days on supplemental oxygen.  Her oxygen requirement slowly improved to 1 to 2 L during the day.  She was discharged to complete oral medicines to treat COVID-19.  In interim, she has had waxing and waning of her symptoms.  She and son state that when she was about 10 to 15 pounds lighter, she came completely off oxygen both during the day and at night.  In addition, her dyspnea on exertion seemed improved.  She was walking 5 "laps" up and down the driveway at the time up to 20 laps during the day.  Over the last several months she is slowly regaining weight and is less active walking less during the day.  She checks her oxygen saturations during the day and gets as low as 90-91 with exertion without supplemental oxygen.  At night, her son is checking  her oxygen and she is wearing 2 L nasal cannula to maintain saturations between 93 and 97%.  He states that oxygen saturations are better when she is propped up sitting up in bed as opposed to when she slides down and is more supine.  She sleeps with 2 pillows.  There is a chest x-ray dated 04/20/2020 with report scanned into EMR (cannot review images) with redness and improvement in bilateral airspace opacities compared to 10/2019 at time of hospitalization.  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 Surgical history: Breast cancer resection Family history: No history of breathing issues or lung disease, breast cancer in mother, son with cancers Social history: Lives in [place], retired, never smoker,   Licensed conveyancer / Pulmonary Flowsheets:   ACT:      No data to display          MMRC:     No data to display          Epworth:      No data to display          Tests:   FENO:  No results found for: "NITRICOXIDE"  PFT:    Latest Ref Rng & Units 04/18/2022   11:54 AM  PFT Results  FVC-Pre L 1.17  P  FVC-Predicted Pre % 50  P  FVC-Post L 1.12  P  FVC-Predicted Post %  48  P  Pre FEV1/FVC % % 82  P  Post FEV1/FCV % % 87  P  FEV1-Pre L 0.96  P  FEV1-Predicted Pre % 55  P  FEV1-Post L 0.97  P  DLCO uncorrected ml/min/mmHg 11.43  P  DLCO UNC% % 65  P  DLCO corrected ml/min/mmHg 11.43  P  DLCO COR %Predicted % 65  P  DLVA Predicted % 96  P  TLC L 4.77  P  TLC % Predicted % 100  P  RV % Predicted % 135  P    P Preliminary result  Personally reviewed and interpreted as spirometry suggestive of severe restriction versus air trapping.  No bronchodilator response.  TLC within the limits, no restriction.  Air-trapping present with elevated RV and RV to TLC ratio.  DLCO was moderate reduced however volume used to calculate this is 2 L below measured total lung capacity status this is almost certainly normal, underestimated at current  reported value.  WALK:      No data to display          Imaging: Chest x-ray 10/2019 with my interpretation as follows: Good inspiration, bilateral infiltrates in lower to mid lung fields  Lab Results: Personally reviewed CBC    Component Value Date/Time   WBC 5.6 09/20/2021 0000   WBC 7.2 10/31/2019 0340   RBC 3.37 (A) 09/20/2021 0000   HGB 10.5 (A) 09/20/2021 0000   HCT 31 (A) 09/20/2021 0000   PLT 210 09/20/2021 0000   MCV 93.0 10/31/2019 0340   MCH 29.4 10/31/2019 0340   MCHC 31.6 10/31/2019 0340   RDW 13.6 10/31/2019 0340   LYMPHSABS 1.3 10/31/2019 0340   MONOABS 1.0 10/31/2019 0340   EOSABS 0.0 10/31/2019 0340   BASOSABS 0.0 10/31/2019 0340    BMET    Component Value Date/Time   NA 138 09/20/2021 0000   K 3.8 09/20/2021 0000   CL 98 (A) 09/20/2021 0000   CO2 36 (A) 09/20/2021 0000   GLUCOSE 93 05/08/2020 1608   GLUCOSE 89 10/31/2019 0340   BUN 12 09/20/2021 0000   CREATININE 1.0 09/20/2021 0000   CREATININE 0.96 05/08/2020 1608   CALCIUM 8.9 09/20/2021 0000   GFRNONAA 56 (L) 05/08/2020 1608   GFRAA 65 05/08/2020 1608    BNP No results found for: "BNP"  ProBNP    Component Value Date/Time   PROBNP 95 04/12/2020 1507    Specialty Problems       Pulmonary Problems   Pneumonia due to COVID-19 virus   Acute bronchitis   Dyspnea on effort   Nocturnal hypoxia    No Known Allergies   There is no immunization history on file for this patient.  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   Mitral regurgitation 04/06/2019   Mitral stenosis 10/12/2018   Nocturnal hypoxia    Osteopenia after menopause 03/21/2022   Otitis media, acute serous    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   aspirin EC 81 MG tablet Take 81 mg by mouth daily.   Calcium Carb-Cholecalciferol 600-200 MG-UNIT TABS Take 1 tablet by mouth 2 (two) times daily.    carvedilol (COREG) 25 MG tablet Take 25 mg by mouth 2 (two) times daily with a meal.   celecoxib (CELEBREX) 100 MG capsule Take by mouth.   cyanocobalamin (,VITAMIN B-12,) 1000 MCG/ML injection every 30 (thirty) days.   ferrous sulfate 324 (65 Fe) MG TBEC Take by mouth.   fluticasone (FLONASE) 50 MCG/ACT nasal spray Place 1 spray into both nostrils in the morning and at bedtime.   levothyroxine (SYNTHROID, LEVOTHROID) 50 MCG tablet Take 50 mcg by mouth daily before breakfast.   lisinopril (PRINIVIL,ZESTRIL) 40 MG tablet Take 40 mg by mouth daily.   Omega-3 Fatty Acids (FISH OIL) 1200 MG CAPS Take 1 capsule by mouth daily.    predniSONE (DELTASONE) 10 MG tablet Take by mouth.   raloxifene (EVISTA) 60 MG tablet TAKE 1 TABLET EVERY DAY   Tiotropium Bromide-Olodaterol (STIOLTO RESPIMAT) 2.5-2.5 MCG/ACT AERS Inhale 2 puffs into the lungs daily.   torsemide (DEMADEX) 100 MG tablet Take 100 mg by mouth in the morning.   torsemide (DEMADEX) 20 MG tablet Take 20 mg by mouth 2 (two) times daily.   [DISCONTINUED] Torsemide 40 MG TABS Take 40 mg by mouth every evening.   No facility-administered encounter medications on file as of 04/18/2022.     Review of Systems  Review of Systems  N/a  Physical Exam  BP 136/74 (BP Location: Right Arm, Patient Position: Sitting, Cuff Size: Large)   Pulse 84   Temp 98.7 F (37.1 C) (Oral)   Ht '5\' 2"'$  (1.575 m)   Wt 232 lb 3.2 oz (105.3 kg)   SpO2 91%   BMI 42.47 kg/m   Wt Readings from Last 5 Encounters:  04/18/22 232 lb 3.2 oz (105.3 kg)  03/21/22 231 lb 1.6 oz (104.8 kg)  12/03/21 231 lb (104.8 kg)  11/04/21 233 lb (105.7 kg)  09/20/21 233 lb 14.4 oz (106.1 kg)    BMI Readings from Last 5 Encounters:  04/18/22 42.47 kg/m  03/21/22 43.67 kg/m  12/03/21 43.65 kg/m  11/04/21 44.02 kg/m  09/20/21 44.20 kg/m     Physical Exam General: Well-appearing, sitting up in exam table Eyes: EOMI, no icterus Neck: Supple, no JVP appreciated Respiratory: Clear to auscultation bilaterally, no wheezes or crackles Cardiovascular: Regular rate and rhythm, no murmurs, trace lower extremity edema  at ankles Abdomen: Soft, bowel sounds present MSK: No synovitis, no joint effusion Neuro: Normal gait, no weakness Psych: Normal mood, full affect    Assessment & Plan:   Dyspnea on exertion: Likely multifactorial related to cardiac abnormalities given history of mitral regurg, aortic stenosis, obesity/OHS and given chronic elevated bicarbonate in the past and need for nocturnal oxygen.  Also, high suspicion for deconditioning.  PFTs performed today largely normal with exception of  air trapping.  Stiolto prescribed for bronchodilation to see if this helps with symptoms.  Nocturnal hypoxemia: largely related to obesity given she was off oxygen and symptomatically improved at a lower weight and given elevated bicarb on prior BMP suggestive of possible OHS.  She continues 2 L at night.  She reports overnight oximetry performed in room since last visit.  I do not have the results.  We will inquire with company in addition to procedure and obtain results as soon as possible.   Return in about 3 months (around 07/19/2022).   Lanier Clam, MD 04/18/2022

## 2022-04-18 NOTE — Patient Instructions (Signed)
Nice to see you again  Your breathing test today was largely normal  It showed extra areas in the lungs after you breathe out.  This can make you short of breath because then they cannot get a deep breath in.  To help you breathe more air out, you Stiolto 2 puffs once a day every day.  Let me know if is too expensive or you cannot get the medication  Return to clinic in 3 months or sooner as needed with Dr. Silas Flood

## 2022-04-18 NOTE — Patient Instructions (Signed)
Full PFT Performed Today  

## 2022-04-18 NOTE — Progress Notes (Signed)
Full PFT Performed Today  

## 2022-04-23 DIAGNOSIS — E538 Deficiency of other specified B group vitamins: Secondary | ICD-10-CM | POA: Diagnosis not present

## 2022-04-25 DIAGNOSIS — Z9181 History of falling: Secondary | ICD-10-CM | POA: Diagnosis not present

## 2022-04-25 DIAGNOSIS — E669 Obesity, unspecified: Secondary | ICD-10-CM | POA: Diagnosis not present

## 2022-04-25 DIAGNOSIS — Z1331 Encounter for screening for depression: Secondary | ICD-10-CM | POA: Diagnosis not present

## 2022-04-25 DIAGNOSIS — Z0000 Encounter for general adult medical examination without abnormal findings: Secondary | ICD-10-CM | POA: Diagnosis not present

## 2022-04-25 DIAGNOSIS — Z6841 Body Mass Index (BMI) 40.0 and over, adult: Secondary | ICD-10-CM | POA: Diagnosis not present

## 2022-04-25 DIAGNOSIS — E785 Hyperlipidemia, unspecified: Secondary | ICD-10-CM | POA: Diagnosis not present

## 2022-05-04 NOTE — Progress Notes (Signed)
Cardiology Office Note:    Date:  05/05/2022   ID:  Nicole Baldwin, DOB [birth year], MRN [id number]  PCP:  Nicole Dandy, NP  Cardiologist:  Nicole More, MD    Referring MD: Nicole Dandy, NP    ASSESSMENT:    1. Hypertensive heart disease with heart failure ([place])   2. Left bundle branch block   3. Nonrheumatic mitral valve stenosis    PLAN:    In order of problems listed above:  I agree pulmonary she has multifactorial shortness of breath and is improved with oxygen bronchodilators and high-dose diuretic.  I also think anemia is part of problem.  We discussed a repeat echocardiogram and we will plan to defer until her next visit continue treatment including aspirin beta-blocker ACE inhibitor and loop diuretic which is controlled hypertension.  Labs are followed in her PCP office Similar pattern on today's EKG She has calcific degenerative mitral valve disease we will recheck an echocardiogram prior to next visit   Next appointment: 9 months   Medication Adjustments/Labs and Tests Ordered: Current medicines are reviewed at length with the patient today.  Concerns regarding medicines are outlined above.  No orders of the defined types were placed in this encounter.  No orders of the defined types were placed in this encounter.   Chief Complaint  Patient presents with   Follow-up   Congestive Heart Failure    History of Present Illness:    Nicole Baldwin is a 81 y.o. female with a hx of hypertensive heart disease with chronic diastolic heart failure left bundle branch block and mild aortic and mitral stenosis last seen 11/04/2021.  Her last echocardiogram was performed 04/03/2021. Aortic valve is described as sclerotic no stenosis or regurgitation. There is mild mitral annular calcification mean gradient 4 mm left ventricle normal in size function with LVH described as severe right ventricle normal size function and pulmonary artery pressure and mild right  atrial enlargement   Compliance with diet, lifestyle and medications: Yes  Has been seen by pulmonary multifactorial dyspnea and is improved with her current bronchodilator and oxygen therapy With high-dose diuretic her weight is stable at home not having edema orthopnea chest pain or syncope Still short of breath with activity but improved Recent labs 02/10/2022 cholesterol 192 LDL 125 A1c 5.6% 03/14/2022 hemoglobin 9.1 creatinine 1.13 potassium Past Medical History:  Diagnosis Date   Abdominal distention    Abnormal echocardiogram 02/03/2018   Abnormal weight gain    Acute bronchitis    Adult BMI 37.0-37.9 kg/sq m    Anxiety    At moderate risk for fall    Bilateral edema of lower extremity 02/03/2018   Body mass index 40.0-44.9, adult (HCC)    Chest pain    Chronic diastolic heart failure ([place])    Depression 02/03/2018   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 Stage 0 (Tis, N0, M0)   Dyslipidemia 02/03/2018   Dyspnea on effort    Essential hypertension 02/03/2018   Fatigue 02/03/2018   General weakness    Generalized obesity    Hematoma of lower extremity, left, initial encounter    Hyperglycemia 02/03/2018   Hyperlipidemia    Hypertensive heart disease with heart failure ([place]) 02/11/2018   Hypothyroidism 02/03/2018   Hypoxemia    Insomnia    Left bundle branch block 04/06/2019   Malaise and fatigue    Mitral  annular calcification 02/11/2018   Mitral regurgitation 04/06/2019   Mitral stenosis 10/12/2018   Nocturnal hypoxia    Osteopenia after menopause 03/21/2022   Otitis media, acute serous    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   aspirin EC 81 MG tablet Take 81 mg by mouth daily.   Calcium Carb-Cholecalciferol 600-200 MG-UNIT TABS Take 1 tablet by mouth 2 (two) times daily.    carvedilol (COREG) 25 MG tablet Take 25 mg by mouth 2 (two) times daily with a meal.   cyanocobalamin (,VITAMIN B-12,) 1000 MCG/ML injection every 30 (thirty) days.   dextromethorphan-guaiFENesin (MUCINEX DM) 30-600 MG 12hr tablet Take 1 tablet by mouth 2 (two) times daily.   ferrous sulfate 324 (65 Fe) MG TBEC Take by mouth.   fluticasone (FLONASE) 50 MCG/ACT nasal spray Place 1 spray into both nostrils in the morning and at bedtime.   levothyroxine (SYNTHROID, LEVOTHROID) 50 MCG tablet Take 50 mcg by mouth daily before breakfast.   lisinopril (PRINIVIL,ZESTRIL) 40 MG tablet Take 40 mg by mouth daily.   Omega-3 Fatty Acids (FISH OIL) 1200 MG CAPS Take 1 capsule by mouth daily.   raloxifene (EVISTA) 60 MG tablet TAKE 1 TABLET EVERY DAY   Tiotropium Bromide-Olodaterol (STIOLTO RESPIMAT) 2.5-2.5 MCG/ACT AERS Inhale 2 puffs into the lungs daily.   torsemide (DEMADEX) 100 MG tablet Take 100 mg by mouth in the morning.   torsemide (DEMADEX) 20 MG tablet Take 20 mg by mouth 2 (two) times daily.     Allergies:   Patient has no known allergies.   Social History   Socioeconomic History   Marital status: Widowed    Spouse name: Not on file   Number of children: Not on file   Years of education: Not on file   Highest education level: Not on file  Occupational History   Not on file  Tobacco Use   Smoking status: Never    Passive exposure: Past   Smokeless tobacco: Never  Vaping Use   Vaping Use: Never used  Substance and Sexual Activity   Alcohol use: Not Currently   Drug use: Not Currently   Sexual activity: Not on file  Other Topics Concern   Not on  file  Social History Narrative   Not on file   Social Determinants of Health   Financial Resource Strain: Not on file  Food Insecurity: Not on file  Transportation Needs: Not on file  Physical Activity: Not on file  Stress: Not on file  Social Connections: Not on file     Family History: The patient's family history includes Breast cancer in her mother; Colon cancer in her son; Diabetes in her brother; Hypertension in her father; Stomach cancer in her son; Testicular cancer in her son. ROS:   Please see the history of present illness.    All other systems reviewed and are negative.  EKGs/Labs/Other Studies Reviewed:    The following studies were reviewed today:  EKG:  EKG ordered today and personally reviewed.  The  ekg ordered today demonstrates sinus rhythm left bundle branch block   Physical Exam:    VS:  BP 110/70 (BP Location: Right Arm, Patient Position: Sitting, Cuff Size: Normal)   Pulse 67   Ht '5\' 2"'$  (1.575 m)   Wt 232 lb (105.2 kg)   SpO2 92%   BMI 42.43 kg/m     Wt Readings from Last 3 Encounters:  05/05/22 232 lb (105.2 kg)  04/18/22 232 lb 3.2 oz (105.3 kg)  03/21/22 231 lb 1.6 oz (104.8 kg)     GEN: Obese BMI 15  Well nourished, well developed in no acute distress HEENT: Normal NECK: No JVD; No carotid bruits LYMPHATICS: No lymphadenopathy CARDIAC: 1/6 murmur aortic stenosis cannot auscultate mitral stenosis RRR, no murmurs, rubs, gallops RESPIRATORY:  Clear to auscultation without rales, wheezing or rhonchi  ABDOMEN: Soft, non-tender, non-distended MUSCULOSKELETAL:  No edema; No deformity  SKIN: Warm and dry NEUROLOGIC:  Alert and oriented x 3 PSYCHIATRIC:  Normal affect    Signed, Nicole More, MD  05/05/2022 3:38 PM     [facility]

## 2022-05-05 ENCOUNTER — Encounter: Payer: Self-pay | Admitting: Cardiology

## 2022-05-05 ENCOUNTER — Ambulatory Visit: Payer: Medicare HMO | Admitting: Cardiology

## 2022-05-05 VITALS — BP 110/70 | HR 67 | Ht 62.0 in | Wt 232.0 lb

## 2022-05-05 DIAGNOSIS — I342 Nonrheumatic mitral (valve) stenosis: Secondary | ICD-10-CM | POA: Diagnosis not present

## 2022-05-05 DIAGNOSIS — I110 Hypertensive heart disease with heart failure: Secondary | ICD-10-CM

## 2022-05-05 DIAGNOSIS — I447 Left bundle-branch block, unspecified: Secondary | ICD-10-CM | POA: Diagnosis not present

## 2022-05-05 NOTE — Addendum Note (Signed)
Addended by: Edwyna Shell I on: 05/05/2022 04:00 PM   Modules accepted: Orders

## 2022-05-05 NOTE — Patient Instructions (Signed)
Medication Instructions:  Your physician recommends that you continue on your current medications as directed. Please refer to the Current Medication list given to you today.  *If you need a refill on your cardiac medications before your next appointment, please call your pharmacy*   Lab Work: None If you have labs (blood work) drawn today and your tests are completely normal, you will receive your results only by: [place] (if you have MyChart) OR A paper copy in the mail If you have any lab test that is abnormal or we need to change your treatment, we will call you to review the results.   Testing/Procedures: Your physician has requested that you have an echocardiogram. Echocardiography is a painless test that uses sound waves to create images of your heart. It provides your doctor with information about the size and shape of your heart and how well your heart's chambers and valves are working. This procedure takes approximately one hour. There are no restrictions for this procedure.    Follow-Up: At [facility], you and your health needs are our priority.  As part of our continuing mission to provide you with exceptional heart care, we have created designated Provider Care Teams.  These Care Teams include your primary Cardiologist (physician) and Advanced Practice Providers (APPs -  Physician Assistants and Nurse Practitioners) who all work together to provide you with the care you need, when you need it.  We recommend signing up for the patient portal called "MyChart".  Sign up information is provided on this After Visit Summary.  MyChart is used to connect with patients for Virtual Visits (Telemedicine).  Patients are able to view lab/test results, encounter notes, upcoming appointments, etc.  Non-urgent messages can be sent to your provider as well.   To learn more about what you can do with MyChart, go to [email or web address].    Your next appointment:   9  month(s)  The format for your next appointment:   In Person  Provider:   Shirlee More, MD    Other Instructions None  Important Information About Sugar

## 2022-05-13 DIAGNOSIS — Z9981 Dependence on supplemental oxygen: Secondary | ICD-10-CM | POA: Diagnosis not present

## 2022-05-13 DIAGNOSIS — I10 Essential (primary) hypertension: Secondary | ICD-10-CM | POA: Diagnosis not present

## 2022-05-13 DIAGNOSIS — R739 Hyperglycemia, unspecified: Secondary | ICD-10-CM | POA: Diagnosis not present

## 2022-05-13 DIAGNOSIS — J9611 Chronic respiratory failure with hypoxia: Secondary | ICD-10-CM | POA: Diagnosis not present

## 2022-05-13 DIAGNOSIS — D539 Nutritional anemia, unspecified: Secondary | ICD-10-CM | POA: Diagnosis not present

## 2022-05-13 DIAGNOSIS — E785 Hyperlipidemia, unspecified: Secondary | ICD-10-CM | POA: Diagnosis not present

## 2022-05-13 DIAGNOSIS — Z1212 Encounter for screening for malignant neoplasm of rectum: Secondary | ICD-10-CM | POA: Diagnosis not present

## 2022-05-13 DIAGNOSIS — I5032 Chronic diastolic (congestive) heart failure: Secondary | ICD-10-CM | POA: Diagnosis not present

## 2022-05-13 DIAGNOSIS — M109 Gout, unspecified: Secondary | ICD-10-CM | POA: Diagnosis not present

## 2022-05-13 DIAGNOSIS — E039 Hypothyroidism, unspecified: Secondary | ICD-10-CM | POA: Diagnosis not present

## 2022-05-22 DIAGNOSIS — M25562 Pain in left knee: Secondary | ICD-10-CM | POA: Diagnosis not present

## 2022-05-28 DIAGNOSIS — D539 Nutritional anemia, unspecified: Secondary | ICD-10-CM | POA: Diagnosis not present

## 2022-06-13 DIAGNOSIS — M25561 Pain in right knee: Secondary | ICD-10-CM | POA: Diagnosis not present

## 2022-06-24 DIAGNOSIS — H9190 Unspecified hearing loss, unspecified ear: Secondary | ICD-10-CM | POA: Diagnosis not present

## 2022-06-24 DIAGNOSIS — H6123 Impacted cerumen, bilateral: Secondary | ICD-10-CM | POA: Diagnosis not present

## 2022-06-24 DIAGNOSIS — H6593 Unspecified nonsuppurative otitis media, bilateral: Secondary | ICD-10-CM | POA: Diagnosis not present

## 2022-06-24 DIAGNOSIS — H9193 Unspecified hearing loss, bilateral: Secondary | ICD-10-CM | POA: Diagnosis not present

## 2022-07-02 DIAGNOSIS — E538 Deficiency of other specified B group vitamins: Secondary | ICD-10-CM | POA: Diagnosis not present

## 2022-07-16 DIAGNOSIS — H6593 Unspecified nonsuppurative otitis media, bilateral: Secondary | ICD-10-CM | POA: Diagnosis not present

## 2022-07-25 ENCOUNTER — Encounter: Payer: Self-pay | Admitting: Pulmonary Disease

## 2022-07-25 ENCOUNTER — Ambulatory Visit: Payer: Medicare HMO | Admitting: Pulmonary Disease

## 2022-07-25 VITALS — BP 120/62 | HR 69 | Ht 62.0 in | Wt 228.6 lb

## 2022-07-25 DIAGNOSIS — G4734 Idiopathic sleep related nonobstructive alveolar hypoventilation: Secondary | ICD-10-CM

## 2022-07-25 DIAGNOSIS — M25562 Pain in left knee: Secondary | ICD-10-CM | POA: Diagnosis not present

## 2022-07-25 DIAGNOSIS — R0609 Other forms of dyspnea: Secondary | ICD-10-CM

## 2022-07-25 NOTE — Progress Notes (Signed)
$'@Patient'f$  ID: Nicole Baldwin, female    DOB: 1940-11-26, 81 y.o.   MRN: [id number]  Chief Complaint  Patient presents with   Follow-up    Follow up for [place]. Pt states that she feels better while on the stiolto. She states she feels like she can get around more.     Referring provider: Lowella Dandy, NP  HPI:  Nicole Baldwin is a 81 y.o. woman with past medical history of mitral regurg, aortic stenosis, and COVID-19 infection with acute hypoxic respiratory failure 10/2019 now no longer using oxygen during the day, using 2 L nocturnal oxygen whom we are seeing in follow-up for dyspnea on exertion.  Overall, she is doing well.  Stiolto prescribed at last visit.  This was to help with her dyspnea especially with PFT results demonstrating air trapping.  She thinks this has helped.  She has more stamina.  Can go farther.  Can walk much faster.  She is pleased with the results.  She continues wear 2 L nasal nasal cannula at night.  Occasionally is checked at night by someone else while she is asleep.  It is staying in the high 90s up to 99%.  HPI initial visit: She does not symptoms really started shortly after hospitalization for COVID-19 in 10/2019.  Notes reviewed from hospitalization.  She was hypoxemic to 80s on room air so EMS was called.  She was kept in the hospital for a few days on supplemental oxygen.  Her oxygen requirement slowly improved to 1 to 2 L during the day.  She was discharged to complete oral medicines to treat COVID-19.  In interim, she has had waxing and waning of her symptoms.  She and son state that when she was about 10 to 15 pounds lighter, she came completely off oxygen both during the day and at night.  In addition, her dyspnea on exertion seemed improved.  She was walking 5 "laps" up and down the driveway at the time up to 20 laps during the day.  Over the last several months she is slowly regaining weight and is less active walking less during the day.  She checks her  oxygen saturations during the day and gets as low as 90-91 with exertion without supplemental oxygen.  At night, her son is checking her oxygen and she is wearing 2 L nasal cannula to maintain saturations between 93 and 97%.  He states that oxygen saturations are better when she is propped up sitting up in bed as opposed to when she slides down and is more supine.  She sleeps with 2 pillows.  There is a chest x-ray dated 04/20/2020 with report scanned into EMR (cannot review images) with redness and improvement in bilateral airspace opacities compared to 10/2019 at time of hospitalization.  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 Surgical history: Breast cancer resection Family history: No history of breathing issues or lung disease, breast cancer in mother, son with cancers Social history: Lives in [place], retired, never smoker,   Licensed conveyancer / Pulmonary Flowsheets:   ACT:      No data to display          MMRC:     No data to display          Epworth:      No data to display          Tests:   FENO:  No results found for: "NITRICOXIDE"  PFT:    Latest  Ref Rng & Units 04/18/2022   11:54 AM  PFT Results  FVC-Pre L 1.17   FVC-Predicted Pre % 50   FVC-Post L 1.12   FVC-Predicted Post % 48   Pre FEV1/FVC % % 82   Post FEV1/FCV % % 87   FEV1-Pre L 0.96   FEV1-Predicted Pre % 55   FEV1-Post L 0.97   DLCO uncorrected ml/min/mmHg 11.43   DLCO UNC% % 65   DLCO corrected ml/min/mmHg 11.43   DLCO COR %Predicted % 65   DLVA Predicted % 96   TLC L 4.77   TLC % Predicted % 100   RV % Predicted % 135   Personally reviewed and interpreted as spirometry suggestive of severe restriction versus air trapping.  No bronchodilator response.  TLC within the limits, no restriction.  Air-trapping present with elevated RV and RV to TLC ratio.  DLCO was moderate reduced however volume used to calculate this is 2 L below measured total lung  capacity status this is almost certainly normal, underestimated at current reported value.  WALK:      No data to display          Imaging: Chest x-ray 10/2019 with my interpretation as follows: Good inspiration, bilateral infiltrates in lower to mid lung fields  Lab Results: Personally reviewed CBC    Component Value Date/Time   WBC 5.6 09/20/2021 0000   WBC 7.2 10/31/2019 0340   RBC 3.37 (A) 09/20/2021 0000   HGB 10.5 (A) 09/20/2021 0000   HCT 31 (A) 09/20/2021 0000   PLT 210 09/20/2021 0000   MCV 93.0 10/31/2019 0340   MCH 29.4 10/31/2019 0340   MCHC 31.6 10/31/2019 0340   RDW 13.6 10/31/2019 0340   LYMPHSABS 1.3 10/31/2019 0340   MONOABS 1.0 10/31/2019 0340   EOSABS 0.0 10/31/2019 0340   BASOSABS 0.0 10/31/2019 0340    BMET    Component Value Date/Time   NA 138 09/20/2021 0000   K 3.8 09/20/2021 0000   CL 98 (A) 09/20/2021 0000   CO2 36 (A) 09/20/2021 0000   GLUCOSE 93 05/08/2020 1608   GLUCOSE 89 10/31/2019 0340   BUN 12 09/20/2021 0000   CREATININE 1.0 09/20/2021 0000   CREATININE 0.96 05/08/2020 1608   CALCIUM 8.9 09/20/2021 0000   GFRNONAA 56 (L) 05/08/2020 1608   GFRAA 65 05/08/2020 1608    BNP No results found for: "BNP"  ProBNP    Component Value Date/Time   PROBNP 95 04/12/2020 1507    Specialty Problems       Pulmonary Problems   Pneumonia due to COVID-19 virus   Acute bronchitis   Dyspnea on effort   Nocturnal hypoxia    No Known Allergies   There is no immunization history on file for this patient.  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   Mitral regurgitation 04/06/2019   Mitral stenosis 10/12/2018   Nocturnal hypoxia    Osteopenia after menopause 03/21/2022   Otitis media, acute serous    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   aspirin EC 81 MG tablet Take 81 mg by mouth daily.   Calcium Carb-Cholecalciferol 600-200 MG-UNIT TABS Take 1 tablet by mouth 2 (two) times daily.    carvedilol (COREG) 25 MG tablet Take 25 mg by mouth 2 (two) times daily with a meal.   cyanocobalamin (,VITAMIN B-12,) 1000 MCG/ML injection every 30 (thirty) days.   dextromethorphan-guaiFENesin (MUCINEX DM) 30-600 MG 12hr tablet Take 1 tablet by mouth 2 (two) times daily.   ferrous sulfate 324 (65 Fe) MG TBEC Take by mouth.   fluticasone (FLONASE) 50 MCG/ACT nasal spray Place 1 spray into both nostrils in the morning and at bedtime.   levothyroxine (SYNTHROID, LEVOTHROID) 50 MCG tablet Take 50 mcg by mouth daily before breakfast.   lisinopril  (PRINIVIL,ZESTRIL) 40 MG tablet Take 40 mg by mouth daily.   Omega-3 Fatty Acids (FISH OIL) 1200 MG CAPS Take 1 capsule by mouth daily.   raloxifene (EVISTA) 60 MG tablet TAKE 1 TABLET EVERY DAY   Tiotropium Bromide-Olodaterol (STIOLTO RESPIMAT) 2.5-2.5 MCG/ACT AERS Inhale 2 puffs into the lungs daily.   torsemide (DEMADEX) 100 MG tablet Take 100 mg by mouth in the morning.   torsemide (DEMADEX) 20 MG tablet Take 20 mg by mouth 2 (two) times daily.   No facility-administered encounter medications on file as of 07/25/2022.     Review of Systems  Review of Systems  N/a  Physical Exam  BP 120/62 (BP Location: Left Arm, Patient Position: Sitting, Cuff Size: Normal)   Pulse 69   Ht '5\' 2"'$  (1.575 m)   Wt 228 lb 9.6 oz (103.7 kg)   SpO2 90%   BMI 41.81 kg/m   Wt Readings from Last 5 Encounters:  07/25/22 228 lb 9.6 oz (103.7 kg)  05/05/22 232 lb (105.2 kg)  04/18/22 232 lb 3.2 oz (105.3 kg)  03/21/22 231 lb 1.6 oz (104.8 kg)  12/03/21 231 lb (104.8 kg)    BMI Readings from Last 5 Encounters:  07/25/22 41.81 kg/m  05/05/22 42.43 kg/m  04/18/22 42.47 kg/m  03/21/22 43.67 kg/m  12/03/21 43.65 kg/m     Physical Exam General: Well-appearing, sitting up in exam table Eyes: EOMI, no icterus Neck: Supple, no JVP appreciated Respiratory: Clear to auscultation bilaterally, no wheezes or crackles Cardiovascular: Regular rate and rhythm, no murmurs, trace lower extremity edema at ankles Abdomen: Soft, bowel sounds present MSK: No synovitis,  no joint effusion Neuro: Normal gait, no weakness Psych: Normal mood, full affect    Assessment & Plan:   Dyspnea on exertion: Likely multifactorial related to cardiac abnormalities given history of mitral regurg, aortic stenosis, obesity/OHS and given chronic elevated bicarbonate in the past and need for nocturnal oxygen.  Also, high suspicion for deconditioning.  PFTs largely normal with exception of air trapping.  Stiolto prescribed  7/23 for bronchodilation with improved symptoms. To continue.   Nocturnal hypoxemia: largely related to obesity given she was off oxygen and symptomatically improved at a lower weight and given elevated bicarb on prior BMP suggestive of possible OHS.  She continues 2 L at night.     Return in about 6 months (around 01/24/2023).   Lanier Clam, MD 07/25/2022

## 2022-07-25 NOTE — Patient Instructions (Signed)
Nice to see you again  No changes to medications  Continue Stiolto  Return to clinic in 6 months or sooner as needed

## 2022-07-31 DIAGNOSIS — H9190 Unspecified hearing loss, unspecified ear: Secondary | ICD-10-CM | POA: Diagnosis not present

## 2022-07-31 DIAGNOSIS — H6690 Otitis media, unspecified, unspecified ear: Secondary | ICD-10-CM | POA: Diagnosis not present

## 2022-07-31 DIAGNOSIS — Z9622 Myringotomy tube(s) status: Secondary | ICD-10-CM | POA: Diagnosis not present

## 2022-08-06 DIAGNOSIS — E538 Deficiency of other specified B group vitamins: Secondary | ICD-10-CM | POA: Diagnosis not present

## 2022-08-12 DIAGNOSIS — E039 Hypothyroidism, unspecified: Secondary | ICD-10-CM | POA: Diagnosis not present

## 2022-08-12 DIAGNOSIS — I5032 Chronic diastolic (congestive) heart failure: Secondary | ICD-10-CM | POA: Diagnosis not present

## 2022-08-12 DIAGNOSIS — D539 Nutritional anemia, unspecified: Secondary | ICD-10-CM | POA: Diagnosis not present

## 2022-08-12 DIAGNOSIS — E538 Deficiency of other specified B group vitamins: Secondary | ICD-10-CM | POA: Diagnosis not present

## 2022-08-12 DIAGNOSIS — E785 Hyperlipidemia, unspecified: Secondary | ICD-10-CM | POA: Diagnosis not present

## 2022-08-12 DIAGNOSIS — J9611 Chronic respiratory failure with hypoxia: Secondary | ICD-10-CM | POA: Diagnosis not present

## 2022-08-12 DIAGNOSIS — I10 Essential (primary) hypertension: Secondary | ICD-10-CM | POA: Diagnosis not present

## 2022-08-12 DIAGNOSIS — Z9981 Dependence on supplemental oxygen: Secondary | ICD-10-CM | POA: Diagnosis not present

## 2022-08-12 DIAGNOSIS — R739 Hyperglycemia, unspecified: Secondary | ICD-10-CM | POA: Diagnosis not present

## 2022-08-13 DIAGNOSIS — M8589 Other specified disorders of bone density and structure, multiple sites: Secondary | ICD-10-CM | POA: Diagnosis not present

## 2022-08-13 DIAGNOSIS — Z780 Asymptomatic menopausal state: Secondary | ICD-10-CM | POA: Diagnosis not present

## 2022-08-13 DIAGNOSIS — D0511 Intraductal carcinoma in situ of right breast: Secondary | ICD-10-CM | POA: Diagnosis not present

## 2022-08-13 DIAGNOSIS — M8580 Other specified disorders of bone density and structure, unspecified site: Secondary | ICD-10-CM | POA: Diagnosis not present

## 2022-08-13 DIAGNOSIS — R92323 Mammographic fibroglandular density, bilateral breasts: Secondary | ICD-10-CM | POA: Diagnosis not present

## 2022-08-13 DIAGNOSIS — Z853 Personal history of malignant neoplasm of breast: Secondary | ICD-10-CM | POA: Diagnosis not present

## 2022-08-22 ENCOUNTER — Encounter: Payer: Self-pay | Admitting: Oncology

## 2022-08-26 ENCOUNTER — Telehealth: Payer: Self-pay | Admitting: Pulmonary Disease

## 2022-08-26 DIAGNOSIS — G4734 Idiopathic sleep related nonobstructive alveolar hypoventilation: Secondary | ICD-10-CM

## 2022-08-26 NOTE — Telephone Encounter (Signed)
Dr. Silas Flood, please advise on this when pt is supposed to be on oxygen. Also routing to April.

## 2022-08-26 NOTE — Telephone Encounter (Signed)
Called patricia from [employer] and she states that the patient has [employer] and is changing over to [employer] now. Checking to see if patient will require new ONO to switch to [facility]. And she does not need new ONO just new updated oxygen order.   Order placed. Nothing further needed

## 2022-08-28 DIAGNOSIS — Z86000 Personal history of in-situ neoplasm of breast: Secondary | ICD-10-CM | POA: Diagnosis not present

## 2022-09-10 DIAGNOSIS — E538 Deficiency of other specified B group vitamins: Secondary | ICD-10-CM | POA: Diagnosis not present

## 2022-09-18 NOTE — Progress Notes (Signed)
[facility]  [address] 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  Current Treatment:    Chemoprevention with raloxifene 60 mg daily   HISTORY OF PRESENT ILLNESS:  Nicole Baldwin is a 81 y.o. 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  Annual screening mammogram in August 2020 revealed a suspicious possible mass of the right breast.  Diagnostic right mammogram and ultrasound confirmed a hypoechoic oval mass with indistinct margins measuring 5 x 4 x 4 mm at 1 o'clock in the right breast, 6 cm from the nipple. Ultrasound guided biopsy in September revealed this to be a papillary carcinoma, at least in situ.  Estrogen receptor was 100% positive and progesterone receptor was 70% positive.  She underwent lumpectomy in October.  Pathology revealed 0.9 cm, intermediate grade, ductal carcinoma in situ with negative margin.  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  She was placed on chemoprevention with raloxifene 60 mg daily in November 2020 and this is planned for 5 years.    She contracted COVID in January 2021 and was hospitalized for 4 days.  She was sent home on oxygen, but was able to be weaned off of this.  Annual bilateral mammogram from August did not reveal any evidence of malignancy.  Bone density scan revealed worsening osteopenia with a T-score of -1.9 of the left femur neck, previously -1.1, a T-score of -0.3 in the total femur, which is normal, and a T-score of -1.2 in the spine. which was stable.  She is taking calcium 600 mg and vitamin D3 1000 international units daily.  INTERVAL HISTORY:  Nicole Baldwin is here  for routine follow up and states that she has been well other than fatigue.   She was found to be deficient in B12 last year, and has been placed on injections by [employer]. Her labs are done through her as well.  Annual bilateral mammogram from November was clear.  She also had a bone density scan done in November 2023, which shows mild worsening.  This shows osteopenia of the spine with a T-score of -1.4 but this is unchanged.  Her left femoral neck has a T-score of -2.3 which is also osteopenia, and the left femoral neck has a T-score of -1.7.  There is a statistically significant rate of change of -2.9% from the prior for the total mean.  I do not know if Amy has checked her vitamin D level but I certainly would do so and she may need treatment for her bones.  I will forward this report to her.  She continues raloxifene daily without significant difficulty.  Her  appetite is good, and she has lost 2 pounds since her last visit.  She denies fever, chills or other signs of infection.  She denies nausea, vomiting, bowel issues, or abdominal pain.  She denies sore throat, cough, dyspnea, or chest pain.  REVIEW OF SYSTEMS:  Review of Systems  Constitutional:  Positive for fatigue. Negative for appetite change, chills, fever and unexpected weight change.  HENT:  Negative.    Eyes: Negative.   Respiratory: Negative.  Negative for chest tightness, cough, hemoptysis, shortness of breath and wheezing.   Cardiovascular: Negative.  Negative for chest pain, leg swelling and palpitations.  Gastrointestinal: Negative.  Negative for abdominal distention, abdominal pain, blood in stool, constipation, diarrhea, nausea and vomiting.  Endocrine: Negative.   Genitourinary: Negative.  Negative for difficulty urinating, dysuria, frequency and hematuria.   Musculoskeletal: Negative.  Negative for arthralgias, back pain, flank pain, gait problem and myalgias.  Skin: Negative.   Neurological: Negative.  Negative for  dizziness, extremity weakness, gait problem, headaches, light-headedness, numbness, seizures and speech difficulty.  Hematological: Negative.   Psychiatric/Behavioral: Negative.  Negative for depression and sleep disturbance. The patient is not nervous/anxious.      VITALS:  Blood pressure 110/77, pulse 63, temperature 98 F (36.7 C), temperature source Oral, resp. rate 20, height '5\' 2"'$  (1.575 m), weight 229 lb 8 oz (104.1 kg), SpO2 96 %.  Wt Readings from Last 3 Encounters:  09/19/22 229 lb 8 oz (104.1 kg)  07/25/22 228 lb 9.6 oz (103.7 kg)  05/05/22 232 lb (105.2 kg)    Body mass index is 41.98 kg/m.  Performance status (ECOG): 1 - Symptomatic but completely ambulatory  PHYSICAL EXAM:  Physical Exam Constitutional:      General: She is not in acute distress.    Appearance: Normal appearance. She is normal weight.  HENT:     Head: Normocephalic and atraumatic.  Eyes:     General: No scleral icterus.    Extraocular Movements: Extraocular movements intact.     Conjunctiva/sclera: Conjunctivae normal.     Pupils: Pupils are equal, round, and reactive to light.  Cardiovascular:     Rate and Rhythm: Normal rate and regular rhythm.     Pulses: Normal pulses.     Heart sounds: Normal heart sounds. No murmur heard.    No friction rub. No gallop.  Pulmonary:     Effort: Pulmonary effort is normal. No respiratory distress.     Breath sounds: Normal breath sounds.  Chest:     Comments: Well healed scar in the upper inner quadrant of the right breast. Both breasts are without masses.  Abdominal:     General: Bowel sounds are normal. There is no distension.     Palpations: Abdomen is soft. There is no hepatomegaly, splenomegaly or mass.     Tenderness: There is no abdominal tenderness.  Musculoskeletal:        General: Normal range of motion.     Cervical back: Normal range of motion and neck supple.     Right lower leg: No edema.     Left lower leg: No edema.  Lymphadenopathy:      Cervical: No cervical adenopathy.  Skin:    General: Skin is warm and dry.  Neurological:     General: No focal deficit present.     Mental Status: She is alert and oriented to person, place, and time. Mental status is at baseline.  Psychiatric:        Mood and Affect: Mood normal.        Behavior: Behavior normal.        Thought Content: Thought content normal.        Judgment: Judgment normal.    LABS:      Latest Ref Rng & Units 09/20/2021   12:00 AM 10/31/2019    3:40 AM 10/30/2019    2:40 AM  CBC  WBC  5.6  7.2  6.7   Hemoglobin 12.0 - 16.0 10.5  10.9  11.1   Hematocrit 36 - 46 31  34.5  34.6   Platelets 150 - 399 210  323  320       Latest Ref Rng & Units 09/20/2021   12:00 AM 05/08/2020    4:08 PM 04/12/2020    3:07 PM  CMP  Glucose 65 - 99 mg/dL  93  84   BUN 4 - '21 12  15  14   '$ Creatinine 0.5 - 1.1 1.0  0.96  0.99   Sodium 137 - 147 138  141  137   Potassium 3.4 - 5.3 3.8  4.0  4.5   Chloride 99 - 108 98  98  95   CO2 13 - 22 36  30  28   Calcium 8.7 - 10.7 8.9  9.2  9.6   Alkaline Phos 25 - 125 64     AST 13 - 35 26     ALT 7 - 35 18       Lab Results  Component Value Date   FERRITIN 40 10/31/2019   FERRITIN 64 10/30/2019   FERRITIN 91 10/29/2019   Lab Results  Component Value Date   LDH 216 (H) 10/28/2019    STUDIES:  No results found.  EXAM:08/13/22 BONE DENSITY SCAN LUMBAR SPINE (L1-L4(L3)): BMD: 1.008 g/cm2 T-score:-1.4 Z-score:0.5 (No significant rate of change from previous exam)  LEFT FEMORAL NECK: BMD:0.718 g/cm2 T-score: -2.3 Z-score: -0.1  LEFT TOTAL HIP  BMD: 0.939 g/cm2 T-score: -0.5 Z-score: 1.6  RIGHT FEMORAL NECK BMD: 0.807 g/cm2 T-score: -1.7 Z-score: 0.6  RIGHT TOTAL HIP BMD: 0.949 g/cm2 T-score: -0.5 Z-score: 1.6  There is a statistically significant rate of change of -2.9% from prior for the total mean.  IMPRESSION: Osteopenia based on BMD    DIAGNOSTIC BILATERAL MAMMOGRAM 08/13/22  Impression:   benign postlumpectomy changes on the right  2.  no evidence of new or recurrent breast carcinoma    HISTORY:   Allergies: No Known Allergies  Current Medications: Current Outpatient Medications  Medication Sig Dispense Refill   allopurinol (ZYLOPRIM) 100 MG tablet Take by mouth.     aspirin EC 81 MG tablet Take 81 mg by mouth daily.     Calcium Carb-Cholecalciferol 600-200 MG-UNIT TABS Take 1 tablet by mouth 2 (two) times daily.      carvedilol (COREG) 25 MG tablet Take 25 mg by mouth 2 (two) times daily with a meal.     cyanocobalamin (,VITAMIN B-12,) 1000 MCG/ML injection every 30 (thirty) days.     dextromethorphan-guaiFENesin (MUCINEX DM) 30-600 MG 12hr tablet Take 1 tablet by mouth 2 (two) times daily.     ferrous sulfate 324 (65 Fe) MG TBEC Take by mouth.     fluticasone (FLONASE) 50 MCG/ACT nasal spray Place 1 spray into both nostrils in the morning and at bedtime.     levothyroxine (SYNTHROID, LEVOTHROID) 50 MCG tablet Take 50 mcg by mouth daily before breakfast.     lisinopril (PRINIVIL,ZESTRIL) 40 MG tablet Take 40 mg by mouth daily.     Omega-3 Fatty Acids (FISH OIL) 1200 MG CAPS Take 1 capsule by mouth daily.     raloxifene (EVISTA) 60 MG tablet Take 1 tablet (60 mg total) by mouth daily. 90 tablet 3   Tiotropium Bromide-Olodaterol (STIOLTO RESPIMAT) 2.5-2.5 MCG/ACT AERS Inhale 2 puffs into the lungs daily. 1 each 11   torsemide (DEMADEX) 100 MG tablet Take 100 mg by mouth in the morning.     Torsemide  40 MG TABS Take 40 mg by mouth 2 (two) times daily.     No current facility-administered medications for this visit.     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  She remains without evidence of recurrence.  She knows to continue raloxifene for at least a total of 5 years.    Osteopenia with worsening in the femur in August 2021, for which she continues calcium and vitamin D.  The raloxifene  should help her bone density as well.  Unfortunately her bone density scan from November of this year shows mild worsening but still osteopenia.  I will ask Amy Moon to check vitamin D levels and give her opinion as to whether she needs a true medication for her bones.  3.  Anemia, with a hemoglobin of 10.5. She states that she has been placed on B12 injections by Laverna Peace, NP, starting with loading dose x4, with plans to continue monthly. She is fatigued. She has follow up with Amy in January.  She knows to continue raloxifene daily. We will plan to see her back in 6 months for re-examination.  If all is well at that time, we will probably go to yearly follow-up.  She tells me that her pharmacy has been changed to [facility] and that will be updated in her chart.  The patient understands the plans discussed today and is in agreement with them. She knows to contact our office if she develops issues requiring immediate clinical assessment.  I provided 15 minutes of face-to-face time during this this encounter and > 50% was spent counseling as documented under my assessment and plan.

## 2022-09-19 ENCOUNTER — Other Ambulatory Visit: Payer: Self-pay | Admitting: Oncology

## 2022-09-19 ENCOUNTER — Encounter: Payer: Self-pay | Admitting: Oncology

## 2022-09-19 ENCOUNTER — Inpatient Hospital Stay: Payer: Medicare HMO | Attending: Oncology | Admitting: Oncology

## 2022-09-19 VITALS — BP 110/77 | HR 63 | Temp 98.0°F | Resp 20 | Ht 62.0 in | Wt 229.5 lb

## 2022-09-19 DIAGNOSIS — D0511 Intraductal carcinoma in situ of right breast: Secondary | ICD-10-CM

## 2022-09-19 DIAGNOSIS — D0581 Other specified type of carcinoma in situ of right breast: Secondary | ICD-10-CM

## 2022-09-19 MED ORDER — RALOXIFENE HCL 60 MG PO TABS: 60.0000 mg | ORAL_TABLET | Freq: Every day | ORAL | 3 refills | Status: DC

## 2022-10-15 DIAGNOSIS — E538 Deficiency of other specified B group vitamins: Secondary | ICD-10-CM | POA: Diagnosis not present

## 2022-10-20 ENCOUNTER — Ambulatory Visit: Payer: Medicare HMO | Attending: Cardiology

## 2022-10-20 DIAGNOSIS — I342 Nonrheumatic mitral (valve) stenosis: Secondary | ICD-10-CM

## 2022-10-20 DIAGNOSIS — I447 Left bundle-branch block, unspecified: Secondary | ICD-10-CM

## 2022-10-20 DIAGNOSIS — I110 Hypertensive heart disease with heart failure: Secondary | ICD-10-CM

## 2022-10-21 LAB — ECHOCARDIOGRAM COMPLETE
Area-P 1/2: 4.08 cm2
Calc EF: 53.5 %
S' Lateral: 3.2 cm
Single Plane A2C EF: 53.2 %
Single Plane A4C EF: 54.9 %

## 2022-11-19 DIAGNOSIS — E538 Deficiency of other specified B group vitamins: Secondary | ICD-10-CM | POA: Diagnosis not present

## 2022-12-16 DIAGNOSIS — I5032 Chronic diastolic (congestive) heart failure: Secondary | ICD-10-CM | POA: Diagnosis not present

## 2022-12-16 DIAGNOSIS — E039 Hypothyroidism, unspecified: Secondary | ICD-10-CM | POA: Diagnosis not present

## 2022-12-16 DIAGNOSIS — E785 Hyperlipidemia, unspecified: Secondary | ICD-10-CM | POA: Diagnosis not present

## 2022-12-16 DIAGNOSIS — E559 Vitamin D deficiency, unspecified: Secondary | ICD-10-CM | POA: Diagnosis not present

## 2022-12-16 DIAGNOSIS — R739 Hyperglycemia, unspecified: Secondary | ICD-10-CM | POA: Diagnosis not present

## 2022-12-16 DIAGNOSIS — M109 Gout, unspecified: Secondary | ICD-10-CM | POA: Diagnosis not present

## 2022-12-16 DIAGNOSIS — D539 Nutritional anemia, unspecified: Secondary | ICD-10-CM | POA: Diagnosis not present

## 2022-12-16 DIAGNOSIS — I10 Essential (primary) hypertension: Secondary | ICD-10-CM | POA: Diagnosis not present

## 2022-12-23 DIAGNOSIS — L821 Other seborrheic keratosis: Secondary | ICD-10-CM | POA: Diagnosis not present

## 2022-12-23 DIAGNOSIS — L820 Inflamed seborrheic keratosis: Secondary | ICD-10-CM | POA: Diagnosis not present

## 2022-12-23 DIAGNOSIS — L578 Other skin changes due to chronic exposure to nonionizing radiation: Secondary | ICD-10-CM | POA: Diagnosis not present

## 2022-12-23 DIAGNOSIS — C44311 Basal cell carcinoma of skin of nose: Secondary | ICD-10-CM | POA: Diagnosis not present

## 2022-12-24 DIAGNOSIS — E538 Deficiency of other specified B group vitamins: Secondary | ICD-10-CM | POA: Diagnosis not present

## 2023-01-27 DIAGNOSIS — Z9622 Myringotomy tube(s) status: Secondary | ICD-10-CM | POA: Diagnosis not present

## 2023-01-28 DIAGNOSIS — E538 Deficiency of other specified B group vitamins: Secondary | ICD-10-CM | POA: Diagnosis not present

## 2023-02-04 NOTE — Progress Notes (Unsigned)
Cardiology Office Note:    Date:  02/05/2023   ID:  Nicole Baldwin, DOB [birth year], MRN [id number]  PCP:  Hurshel Party, NP  Cardiologist:  Norman Herrlich, MD    Referring MD: Hurshel Party, NP    ASSESSMENT:    1. Hypertensive heart disease with heart failure   2. Left bundle branch block   3. Nonrheumatic mitral valve stenosis   4. Mitral annular calcification    PLAN:    In order of problems listed above:  Overall is doing well heart failure is compensated she still has a mild degree of edema I think if she follow a sodium restricted with clearance she will continue her current high-dose loop diuretic as well as antihypertensive with carvedilol and lisinopril Non rheumatic calcific mitral stenosis is mild stable rarely does this progress onto the need for valve surgery and unfortunately this population with a high degree comorbidities does very poorly with valvular intervention   Next appointment: 6 months   Medication Adjustments/Labs and Tests Ordered: Current medicines are reviewed at length with the patient today.  Concerns regarding medicines are outlined above.  No orders of the defined types were placed in this encounter.  No orders of the defined types were placed in this encounter.   Chief Complaint  Patient presents with   Follow-up   Congestive Heart Failure   Mitral Stenosis    History of Present Illness:    Nicole Baldwin is a 82 y.o. female with a hx of hypertensive heart disease with chronic diastolic heart failure left bundle branch block mild aortic and mitral stenosis last seen 05/05/2022.  She subsequently had a repeat echocardiogram performed 10/20/2022 left ventricular ejection fraction low normal 50 to 55% with mild concentric LVH and grade 1 diastolic filling right ventricle normal size and function and pulmonary artery pressure.  Aortic valve was calcified sclerotic but no stenosis was noted mean mitral valve gradient was 4 mmHg  consistent with mild mitral stenosis. Compliance with diet, lifestyle and medications: Yes  She is a bit frustrated she still is edema despite high-dose diuretic and apparent good response when she takes it and then acknowledges that she is using reduce salt putting on her food not convinced her to fully sodium restriction I think will clear the residual edema Her breathing is good no orthopnea chest pain palpitation or syncope Recent labs with her PCP are favorable cholesterol 213 LDL 138 A1c 5.6% hemoglobin 10.7 creatinine 1.12 potassium   Past Medical History:  Diagnosis Date   Abdominal distention    Abnormal echocardiogram 02/03/2018   Abnormal weight gain    Acute bronchitis    Adult BMI 37.0-37.9 kg/sq m    Anxiety    At moderate risk for fall    Bilateral edema of lower extremity 02/03/2018   Body mass index 40.0-44.9, adult    Chest pain    Chronic diastolic heart failure    Depression 02/03/2018   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 Stage 0 (Tis, N0, M0)   Dyslipidemia 02/03/2018   Dyspnea on effort    Essential hypertension 02/03/2018   Fatigue 02/03/2018   General weakness    Generalized obesity    Hematoma of lower extremity, left, initial encounter    Hyperglycemia 02/03/2018   Hyperlipidemia    Hypertensive heart disease with heart failure 02/11/2018   Hypothyroidism 02/03/2018   Hypoxemia  Insomnia    Left bundle branch block 04/06/2019   Malaise and fatigue    Mitral annular calcification 02/11/2018   Mitral regurgitation 04/06/2019   Mitral stenosis 10/12/2018   Nocturnal hypoxia    Osteopenia after menopause 03/21/2022   Otitis media, acute serous    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   aspirin EC 81 MG tablet Take 81 mg by mouth daily.   Calcium Carb-Cholecalciferol 600-200 MG-UNIT TABS Take 1 tablet by mouth 2 (two) times daily.    carvedilol (COREG) 25 MG tablet Take 25 mg by mouth 2 (two) times daily with a meal.   cyanocobalamin (,VITAMIN B-12,) 1000 MCG/ML injection every 30 (thirty) days.   dextromethorphan-guaiFENesin (MUCINEX DM) 30-600 MG 12hr tablet Take 1 tablet by mouth 2 (two) times daily.   ferrous sulfate 324 (65 Fe) MG TBEC Take by mouth.   fluticasone (FLONASE) 50 MCG/ACT nasal spray Place 1 spray into both nostrils in the morning and at bedtime.   levothyroxine (SYNTHROID, LEVOTHROID) 50 MCG tablet Take 50 mcg by mouth daily before breakfast.   lisinopril (PRINIVIL,ZESTRIL) 40 MG tablet Take 40 mg by mouth daily.   Omega-3 Fatty Acids (FISH OIL) 1200 MG CAPS Take 1 capsule by mouth daily.   raloxifene (EVISTA) 60 MG tablet Take 1 tablet (60 mg total) by mouth daily.   Tiotropium Bromide-Olodaterol (STIOLTO RESPIMAT) 2.5-2.5 MCG/ACT AERS Inhale 2 puffs into the lungs daily.   torsemide (DEMADEX) 100 MG tablet Take 100 mg by mouth in the morning.   Torsemide 40 MG TABS Take 40 mg by mouth 2 (two) times daily.     Allergies:   Patient has no known allergies.   Social History   Socioeconomic History   Marital status: Widowed    Spouse name: Not on file   Number of children: Not on file   Years of education: Not on file   Highest education level: Not on file  Occupational History   Not on file  Tobacco Use   Smoking status: Never    Passive exposure: Past   Smokeless tobacco: Never  Vaping Use   Vaping Use: Never used  Substance and Sexual Activity   Alcohol use: Not Currently   Drug use:  Not Currently   Sexual activity: Not on file  Other Topics Concern   Not on file  Social History Narrative   Not on file   Social Determinants of Health   Financial Resource Strain: Not on file  Food Insecurity: Not on file  Transportation Needs: Not on file  Physical Activity: Not on file  Stress: Not on file  Social Connections: Not on file     Family History: The patient's family history includes Breast cancer in her mother; Colon cancer in her son; Diabetes in her brother; Hypertension in her father; Stomach cancer in her son; Testicular cancer in her son. ROS:   Please see the history of present illness.    All other systems reviewed and are negative.  EKGs/Labs/Other Studies  Reviewed:    The following studies were reviewed today:  Cardiac Studies & Procedures       ECHOCARDIOGRAM  ECHOCARDIOGRAM COMPLETE 10/21/2022  Narrative ECHOCARDIOGRAM REPORT    Patient Name:   Laticha ANN OWENS Goertzen Date of Exam: 10/20/2022 Medical Rec #:  [id number]               Height:       62.0 in Accession #:    [id number]              Weight:       229.5 lb Date of Birth:  [birth year]              BSA:          2.027 m Patient Age:    81 years                BP:           110/77 mmHg Patient Gender: F                       HR:           65 bpm. Exam Location:  [facility]  Procedure: 2D Echo, Cardiac Doppler, Color Doppler and Strain Analysis  Indications:    Hypertensive heart disease with heart failure (HCC) [I11.0 (ICD-10-CM)]; Left bundle branch block [I44.7 (ICD-10-CM)]; Nonrheumatic mitral valve stenosis [I34.2 (ICD-10-CM)]  History:        Patient has prior history of Echocardiogram examinations, most recent 04/03/2021. Mild mitral stenosis, Signs/Symptoms:Hypertensive Heart Disease, Malaise and fatigue and Fever; Risk Factors:Hypertension and Dyslipidemia.  Sonographer:    Louie Boston RDCS Referring Phys: [id number] Baldo Daub   Sonographer Comments: Technically  difficult study due to poor echo windows. IMPRESSIONS   1. GLS -10.2. Left ventricular ejection fraction, by estimation, is 50 to 55%. The left ventricle has low normal function. The left ventricle has no regional wall motion abnormalities. There is mild left ventricular hypertrophy. Left ventricular diastolic parameters are consistent with Grade I diastolic dysfunction (impaired relaxation). 2. Right ventricular systolic function is normal. The right ventricular size is normal. There is normal pulmonary artery systolic pressure. 3. The mitral valve is normal in structure. No evidence of mitral valve regurgitation. No evidence of mitral stenosis. 4. The aortic valve is normal in structure. Aortic valve regurgitation is not visualized. Aortic valve sclerosis/calcification is present, without any evidence of aortic stenosis. 5. There is mild dilatation of the ascending aorta, measuring 39 mm. 6. The inferior vena cava is normal in size with greater than 50% respiratory variability, suggesting right atrial pressure of 3 mmHg.  FINDINGS Left Ventricle: GLS -10.2. Left ventricular ejection fraction, by estimation, is 50 to 55%. The left ventricle has low normal function. The left ventricle has no regional wall motion abnormalities. The left ventricular internal cavity size was normal in size. There is mild left ventricular hypertrophy. Left ventricular diastolic parameters are consistent with Grade I diastolic dysfunction (impaired relaxation).  Right Ventricle: The right ventricular size is normal. No increase in right ventricular wall thickness. Right ventricular systolic function is normal. There is normal pulmonary artery systolic pressure. The tricuspid regurgitant velocity is 2.86 m/s, and with an assumed right atrial pressure of 3 mmHg, the estimated right ventricular systolic pressure is 35.7 mmHg.  Left Atrium: Left atrial size was normal in size.  Right Atrium: Right atrial size was normal  in size.  Pericardium: There is no evidence of pericardial  effusion.  Mitral Valve: The mitral valve is normal in structure. No evidence of mitral valve regurgitation. No evidence of mitral valve stenosis. MV peak gradient, 9.1 mmHg. The mean mitral valve gradient is 4.0 mmHg.  Tricuspid Valve: The tricuspid valve is normal in structure. Tricuspid valve regurgitation is mild . No evidence of tricuspid stenosis.  Aortic Valve: The aortic valve is normal in structure. Aortic valve regurgitation is not visualized. Aortic valve sclerosis/calcification is present, without any evidence of aortic stenosis.  Pulmonic Valve: The pulmonic valve was normal in structure. Pulmonic valve regurgitation is not visualized. No evidence of pulmonic stenosis.  Aorta: The aortic root is normal in size and structure. There is mild dilatation of the ascending aorta, measuring 39 mm.  Venous: The inferior vena cava is normal in size with greater than 50% respiratory variability, suggesting right atrial pressure of 3 mmHg.  IAS/Shunts: No atrial level shunt detected by color flow Doppler.   LEFT VENTRICLE PLAX 2D LVIDd:         4.60 cm     Diastology LVIDs:         3.20 cm     LV e' medial:    4.90 cm/s LV PW:         1.20 cm     LV E/e' medial:  24.9 LV IVS:        1.20 cm     LV e' lateral:   5.55 cm/s LVOT diam:     2.00 cm     LV E/e' lateral: 22.0 LV SV:         105 LV SV Index:   52 LVOT Area:     3.14 cm  LV Volumes (MOD) LV vol d, MOD A2C: 58.8 ml LV vol d, MOD A4C: 71.1 ml LV vol s, MOD A2C: 27.5 ml LV vol s, MOD A4C: 32.1 ml LV SV MOD A2C:     31.3 ml LV SV MOD A4C:     71.1 ml LV SV MOD BP:      34.9 ml  RIGHT VENTRICLE            IVC RV S prime:     9.46 cm/s  IVC diam: 1.70 cm TAPSE (M-mode): 1.9 cm  LEFT ATRIUM             Index        RIGHT ATRIUM           Index LA diam:        3.50 cm 1.73 cm/m   RA Area:     19.80 cm LA Vol (A2C):   63.8 ml 31.48 ml/m  RA Volume:   55.40 ml   27.33 ml/m LA Vol (A4C):   58.7 ml 28.96 ml/m LA Biplane Vol: 61.6 ml 30.39 ml/m AORTIC VALVE LVOT Vmax:   130.00 cm/s LVOT Vmean:  95.900 cm/s LVOT VTI:    0.333 m  AORTA Ao Root diam: 2.50 cm Ao Asc diam:  3.85 cm Ao Desc diam: 2.60 cm  MITRAL VALVE                TRICUSPID VALVE MV Area (PHT): 4.08 cm     TR Peak grad:   32.7 mmHg MV Peak grad:  9.1 mmHg     TR Vmax:        286.00 cm/s MV Mean grad:  4.0 mmHg MV Vmax:       1.51 m/s     SHUNTS MV Vmean:  92.7 cm/s    Systemic VTI:  0.33 m MV Decel Time: 186 msec     Systemic Diam: 2.00 cm MV E velocity: 122.00 cm/s MV A velocity: 159.00 cm/s MV E/A ratio:  0.77  Gypsy Balsam MD Electronically signed by Gypsy Balsam MD Signature Date/Time: 10/21/2022/12:25:11 PM    Final              Physical Exam:    VS:  BP (!) 156/74 (BP Location: Left Arm, Patient Position: Sitting)   Pulse 76   Ht 5\' 2"  (1.575 m)   Wt 235 lb 9.6 oz (106.9 kg)   SpO2 92%   BMI 43.09 kg/m     Wt Readings from Last 3 Encounters:  02/05/23 235 lb 9.6 oz (106.9 kg)  09/19/22 229 lb 8 oz (104.1 kg)  07/25/22 228 lb 9.6 oz (103.7 kg)     GEN:  Well nourished, well developed in no acute distress obese BMI 40-40 HEENT: Normal NECK: No JVD; No carotid bruits LYMPHATICS: No lymphadenopathy CARDIAC: RRR, no murmurs, rubs, gallops RESPIRATORY:  Clear to auscultation without rales, wheezing or rhonchi  ABDOMEN: Soft, non-tender, non-distended MUSCULOSKELETAL:  No edema; No deformity  SKIN: Warm and dry NEUROLOGIC:  Alert and oriented x 3 PSYCHIATRIC:  Normal affect    Signed, Norman Herrlich, MD  02/05/2023 2:07 PM    [facility]

## 2023-02-05 ENCOUNTER — Encounter: Payer: Self-pay | Admitting: Cardiology

## 2023-02-05 ENCOUNTER — Ambulatory Visit: Payer: Medicare HMO | Attending: Cardiology | Admitting: Cardiology

## 2023-02-05 VITALS — BP 156/74 | HR 76 | Ht 62.0 in | Wt 235.6 lb

## 2023-02-05 DIAGNOSIS — I110 Hypertensive heart disease with heart failure: Secondary | ICD-10-CM | POA: Diagnosis not present

## 2023-02-05 DIAGNOSIS — I447 Left bundle-branch block, unspecified: Secondary | ICD-10-CM | POA: Diagnosis not present

## 2023-02-05 DIAGNOSIS — I3481 Nonrheumatic mitral (valve) annulus calcification: Secondary | ICD-10-CM | POA: Diagnosis not present

## 2023-02-05 DIAGNOSIS — I342 Nonrheumatic mitral (valve) stenosis: Secondary | ICD-10-CM

## 2023-02-05 NOTE — Patient Instructions (Signed)
Medication Instructions:  Your physician recommends that you continue on your current medications as directed. Please refer to the Current Medication list given to you today.  *If you need a refill on your cardiac medications before your next appointment, please call your pharmacy*   Lab Work: None ordered If you have labs (blood work) drawn today and your tests are completely normal, you will receive your results only by: MyChart Message (if you have MyChart) OR A paper copy in the mail If you have any lab test that is abnormal or we need to change your treatment, we will call you to review the results.   Testing/Procedures: None ordered   Follow-Up: At [facility], you and your health needs are our priority.  As part of our continuing mission to provide you with exceptional heart care, we have created designated Provider Care Teams.  These Care Teams include your primary Cardiologist (physician) and Advanced Practice Providers (APPs -  Physician Assistants and Nurse Practitioners) who all work together to provide you with the care you need, when you need it.  We recommend signing up for the patient portal called "MyChart".  Sign up information is provided on this After Visit Summary.  MyChart is used to connect with patients for Virtual Visits (Telemedicine).  Patients are able to view lab/test results, encounter notes, upcoming appointments, etc.  Non-urgent messages can be sent to your provider as well.   To learn more about what you can do with MyChart, go to [email or web address].    Your next appointment:   6 month(s)  The format for your next appointment:   In Person  Provider:   Brian Munley, MD    Other Instructions none  Important Information About Sugar       

## 2023-02-20 ENCOUNTER — Inpatient Hospital Stay: Payer: Medicare HMO | Attending: Oncology | Admitting: Oncology

## 2023-02-20 ENCOUNTER — Encounter: Payer: Self-pay | Admitting: Oncology

## 2023-02-20 VITALS — BP 137/64 | HR 68 | Temp 98.1°F | Resp 20 | Ht 62.0 in | Wt 235.0 lb

## 2023-02-20 DIAGNOSIS — D0511 Intraductal carcinoma in situ of right breast: Secondary | ICD-10-CM

## 2023-02-20 DIAGNOSIS — D0581 Other specified type of carcinoma in situ of right breast: Secondary | ICD-10-CM | POA: Diagnosis not present

## 2023-02-20 NOTE — Progress Notes (Signed)
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  Current Treatment:    Chemoprevention with raloxifene 60 mg daily  HISTORY OF PRESENT ILLNESS:  Nicole Baldwin is a 82 y.o. 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  Annual screening mammogram in August 2020 revealed a suspicious possible mass of the right breast.  Diagnostic right mammogram and ultrasound confirmed a hypoechoic oval mass with indistinct margins measuring 5 x 4 x 4 mm at 1 o'clock in the right breast, 6 cm from the nipple. Ultrasound guided biopsy in September revealed this to be a papillary carcinoma, at least in situ.  Estrogen receptor was 100% positive and progesterone receptor was 70% positive.  She underwent lumpectomy in October.  Pathology revealed 0.9 cm, intermediate grade, ductal carcinoma in situ with negative margin.  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  She was placed on chemoprevention with raloxifene 60 mg daily in November 2020 and this is planned for 5 years.    She contracted COVID in January 2021 and was hospitalized for 4 days.  She was sent home on oxygen, but was able to be weaned off of this.  Annual bilateral mammogram from August did not reveal any evidence of malignancy.  Bone density scan revealed worsening osteopenia with a T-score of -1.9 of the left femur neck, previously -1.1, a T-score of -0.3 in the total femur, which is normal, and a T-score of -1.2 in the spine. which was stable.  She is taking calcium 600 mg and vitamin D3 1000 international units daily.  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 Patient states that she feels well and complains of left knee pain rating a 5/10. Her last mammogram from 08/13/2022 was clear. Her bone density scan showed mild worsening.  This shows osteopenia of the spine with a T-score of -1.4 but this is unchanged.  Her left femoral neck has a T-score of -2.3 which is also osteopenia, and the left femoral neck has a T-score of -1.7.  She continues chemoprevention with Raloxifene 60mg  daily without difficulty. She will see Dr. Vinson Moselle in 6 months for evaluations and annual mammogram. I will see her back in 1 year for reevaluation. She denies signs of infection such as sore throat, sinus drainage, cough, or urinary symptoms.  She denies fevers or recurrent chills. She denies pain. She denies nausea, vomiting, chest pain, dyspnea or cough. Her weight has been stable.  REVIEW OF SYSTEMS:  Review of Systems  Constitutional: Negative.  Negative for appetite change, chills, diaphoresis, fatigue, fever and unexpected weight change.  HENT:  Negative.  Negative for hearing loss, lump/mass, mouth sores, nosebleeds, sore throat, tinnitus, trouble swallowing and voice change.   Eyes: Negative.  Negative for eye problems and icterus.  Respiratory: Negative.  Negative for chest tightness, cough, hemoptysis, shortness of breath and wheezing.   Cardiovascular:  Positive for leg swelling (right leg). Negative for chest pain and palpitations.  Gastrointestinal: Negative.  Negative for abdominal  distention, abdominal pain, blood in stool, constipation, diarrhea, nausea, rectal pain and vomiting.  Endocrine: Negative.   Genitourinary: Negative.  Negative for bladder incontinence, difficulty urinating, dyspareunia, dysuria, frequency, hematuria, menstrual problem, nocturia, pelvic pain, vaginal bleeding and vaginal discharge.   Musculoskeletal:  Positive for arthralgias  (of the left knee). Negative for back pain, flank pain, gait problem, myalgias, neck pain and neck stiffness.  Skin: Negative.  Negative for itching, rash and wound.  Neurological: Negative.  Negative for dizziness, extremity weakness, gait problem, headaches, light-headedness, numbness, seizures and speech difficulty.  Hematological: Negative.  Negative for adenopathy. Does not bruise/bleed easily.  Psychiatric/Behavioral: Negative.  Negative for confusion, decreased concentration, depression, sleep disturbance and suicidal ideas. The patient is not nervous/anxious.      VITALS:  Blood pressure 137/64, pulse 68, temperature 98.1 F (36.7 C), temperature source Oral, resp. rate 20, height 5\' 2"  (1.575 m), weight 235 lb (106.6 kg), SpO2 93 %.  Wt Readings from Last 3 Encounters:  02/20/23 235 lb (106.6 kg)  02/05/23 235 lb 9.6 oz (106.9 kg)  09/19/22 229 lb 8 oz (104.1 kg)    Body mass index is 42.98 kg/m.  Performance status (ECOG): 1 - Symptomatic but completely ambulatory  PHYSICAL EXAM:  Physical Exam Vitals and nursing note reviewed.  Constitutional:      General: She is not in acute distress.    Appearance: Normal appearance. She is obese. She is not ill-appearing, toxic-appearing or diaphoretic.  HENT:     Head: Normocephalic and atraumatic.     Right Ear: Tympanic membrane, ear canal and external ear normal. There is no impacted cerumen.     Left Ear: Tympanic membrane, ear canal and external ear normal. There is no impacted cerumen.     Nose: Nose normal. No congestion or rhinorrhea.     Mouth/Throat:     Mouth: Mucous membranes are moist.     Pharynx: Oropharynx is clear. No oropharyngeal exudate or posterior oropharyngeal erythema.  Eyes:     General: No scleral icterus.       Right eye: No discharge.        Left eye: No discharge.     Extraocular Movements: Extraocular movements intact.     Conjunctiva/sclera: Conjunctivae normal.     Pupils: Pupils are equal,  round, and reactive to light.  Neck:     Vascular: No carotid bruit.  Cardiovascular:     Rate and Rhythm: Normal rate and regular rhythm.     Pulses: Normal pulses.     Heart sounds: Normal heart sounds. No murmur heard.    No friction rub. No gallop.  Pulmonary:     Effort: Pulmonary effort is normal. No respiratory distress.     Breath sounds: Normal breath sounds. No stridor. No wheezing, rhonchi or rales.  Chest:     Chest wall: No tenderness.     Comments: Well healed scar in the upper inner quadrant of the right breast.  Both breasts are without masses.  Abdominal:     General: Bowel sounds are normal. There is no distension.     Palpations: Abdomen is soft. There is no hepatomegaly, splenomegaly or mass.     Tenderness: There is no abdominal tenderness. There is no right CVA tenderness, left CVA tenderness, guarding or rebound.     Hernia: No hernia is present.  Musculoskeletal:        General: No swelling, tenderness, deformity or signs of injury. Normal range of motion.  Cervical back: Normal range of motion and neck supple. No rigidity or tenderness.     Right lower leg: 1+ Edema present.     Left lower leg: Edema (trace) present.     Comments: No calf tenderness or cords, negative homans sign  Lymphadenopathy:     Cervical: No cervical adenopathy.     Right cervical: No superficial, deep or posterior cervical adenopathy.    Left cervical: No superficial, deep or posterior cervical adenopathy.     Upper Body:     Right upper body: No supraclavicular, axillary or pectoral adenopathy.     Left upper body: No supraclavicular, axillary or pectoral adenopathy.  Skin:    General: Skin is warm and dry.     Coloration: Skin is not jaundiced or pale.     Findings: No bruising, erythema, lesion or rash.  Neurological:     General: No focal deficit present.     Mental Status: She is alert and oriented to person, place, and time. Mental status is at baseline.     Cranial  Nerves: No cranial nerve deficit.     Sensory: No sensory deficit.     Motor: No weakness.     Coordination: Coordination normal.     Gait: Gait normal.     Deep Tendon Reflexes: Reflexes normal.  Psychiatric:        Mood and Affect: Mood normal.        Behavior: Behavior normal.        Thought Content: Thought content normal.        Judgment: Judgment normal.    LABS:      Latest Ref Rng & Units 09/20/2021   12:00 AM 10/31/2019    3:40 AM 10/30/2019    2:40 AM  CBC  WBC  5.6  7.2  6.7   Hemoglobin 12.0 - 16.0 10.5  10.9  11.1   Hematocrit 36 - 46 31  34.5  34.6   Platelets 150 - 399 210  323  320       Latest Ref Rng & Units 09/20/2021   12:00 AM 05/08/2020    4:08 PM 04/12/2020    3:07 PM  CMP  Glucose 65 - 99 mg/dL  93  84   BUN 4 - 21 12  15  14    Creatinine 0.5 - 1.1 1.0  0.96  0.99   Sodium 137 - 147 138  141  137   Potassium 3.4 - 5.3 3.8  4.0  4.5   Chloride 99 - 108 98  98  95   CO2 13 - 22 36  30  28   Calcium 8.7 - 10.7 8.9  9.2  9.6   Alkaline Phos 25 - 125 64     AST 13 - 35 26     ALT 7 - 35 18       Lab Results  Component Value Date   FERRITIN 40 10/31/2019   FERRITIN 64 10/30/2019   FERRITIN 91 10/29/2019   Lab Results  Component Value Date   LDH 216 (H) 10/28/2019    STUDIES:  No results found.   EXAM:08/13/22 BONE DENSITY SCAN LUMBAR SPINE (L1-L4(L3)): BMD: 1.008 g/cm2 T-score:-1.4 Z-score:0.5 (No significant rate of change from previous exam)  LEFT FEMORAL NECK: BMD:0.718 g/cm2 T-score: -2.3 Z-score: -0.1  LEFT TOTAL HIP  BMD: 0.939 g/cm2 T-score: -0.5 Z-score: 1.6  RIGHT FEMORAL NECK BMD: 0.807 g/cm2 T-score: -1.7 Z-score: 0.6  RIGHT TOTAL HIP BMD: 0.949  g/cm2 T-score: -0.5 Z-score: 1.6  There is a statistically significant rate of change of -2.9% from prior for the total mean.  IMPRESSION: Osteopenia based on BMD   HISTORY:   Allergies: No Known Allergies  Current Medications: Current Outpatient Medications   Medication Sig Dispense Refill   allopurinol (ZYLOPRIM) 100 MG tablet Take by mouth.     aspirin EC 81 MG tablet Take 81 mg by mouth daily.     Calcium Carb-Cholecalciferol 600-200 MG-UNIT TABS Take 1 tablet by mouth 2 (two) times daily.      carvedilol (COREG) 25 MG tablet Take 25 mg by mouth 2 (two) times daily with a meal.     cyanocobalamin (,VITAMIN B-12,) 1000 MCG/ML injection every 30 (thirty) days.     dextromethorphan-guaiFENesin (MUCINEX DM) 30-600 MG 12hr tablet Take 1 tablet by mouth 2 (two) times daily.     ferrous sulfate 324 (65 Fe) MG TBEC Take by mouth.     fluticasone (FLONASE) 50 MCG/ACT nasal spray Place 1 spray into both nostrils in the morning and at bedtime.     levothyroxine (SYNTHROID, LEVOTHROID) 50 MCG tablet Take 50 mcg by mouth daily before breakfast.     lisinopril (PRINIVIL,ZESTRIL) 40 MG tablet Take 40 mg by mouth daily.     Omega-3 Fatty Acids (FISH OIL) 1200 MG CAPS Take 1 capsule by mouth daily.     raloxifene (EVISTA) 60 MG tablet Take 1 tablet (60 mg total) by mouth daily. 90 tablet 3   Tiotropium Bromide-Olodaterol (STIOLTO RESPIMAT) 2.5-2.5 MCG/ACT AERS Inhale 2 puffs into the lungs daily. 1 each 11   torsemide (DEMADEX) 100 MG tablet Take 100 mg by mouth in the morning.     Torsemide 40 MG TABS Take 40 mg by mouth 2 (two) times daily.     No current facility-administered medications for this visit.     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  She remains without evidence of recurrence.  She knows to continue raloxifene for at least a total of 5 years.    Osteopenia with worsening in the femur in August 2021, for which she continues calcium and vitamin D.  The raloxifene should help her bone density as well.  Unfortunately her bone density scan from November of this year shows mild worsening but still osteopenia.  I will ask Amy Moon to check vitamin D levels and give her  opinion as to whether she needs a medication for her bones.  3.  Anemia, with a hemoglobin of 10.5. She states that she has been placed on B12 injections by Gaye Alken, NP, starting with loading dose x4, with plans to continue monthly.   4.  Morbid Obesity. At least her weight is stable today.   Plan Her last mammogram from 08/13/2022 was clear. Her bone density scan showed mild worsening.  This shows osteopenia of the spine with a T-score of -1.4 but this is unchanged.  Her left femoral neck has a T-score of -2.3 which is also osteopenia, and the left femoral neck has a T-score of -1.7.  She continues chemoprevention with Raloxifene 60mg  daily without difficulty. She will see Dr. Vinson Moselle in 6 months for evaluations and annual mammogram. I will see her back in 1 year for reevaluation. The patient understands the plans discussed today and is in agreement with them. She knows to contact our office if she develops issues requiring immediate clinical assessment.  I provided 10 minutes of face-to-face time during this this encounter and > 50% was spent counseling as documented under my assessment and plan.    I,Jasmine M Lassiter,acting as a scribe for Dellia Beckwith, MD.,have documented all relevant documentation on the behalf of Dellia Beckwith, MD,as directed by  Dellia Beckwith, MD while in the presence of Dellia Beckwith, MD.

## 2023-02-23 ENCOUNTER — Telehealth: Payer: Self-pay | Admitting: Oncology

## 2023-02-23 ENCOUNTER — Telehealth: Payer: Self-pay

## 2023-02-23 NOTE — Telephone Encounter (Signed)
-----   Message from Dellia Beckwith, MD sent at 02/20/2023  4:33 PM EDT ----- Regarding: records Request vitamin D level from [facility]

## 2023-02-23 NOTE — Telephone Encounter (Signed)
-----   Message from Christine H McCarty, MD sent at 02/20/2023  4:33 PM EDT ----- Regarding: records Request vitamin D level from Amy Moon  

## 2023-02-23 NOTE — Telephone Encounter (Signed)
Office will send over copy of Vitamin D levels.

## 2023-02-23 NOTE — Telephone Encounter (Signed)
02/23/23 Spoke with patient and scheduled next appt

## 2023-03-04 DIAGNOSIS — E538 Deficiency of other specified B group vitamins: Secondary | ICD-10-CM | POA: Diagnosis not present

## 2023-03-13 DIAGNOSIS — I10 Essential (primary) hypertension: Secondary | ICD-10-CM | POA: Diagnosis not present

## 2023-03-13 DIAGNOSIS — I5032 Chronic diastolic (congestive) heart failure: Secondary | ICD-10-CM | POA: Diagnosis not present

## 2023-03-24 DIAGNOSIS — L821 Other seborrheic keratosis: Secondary | ICD-10-CM | POA: Diagnosis not present

## 2023-03-24 DIAGNOSIS — L578 Other skin changes due to chronic exposure to nonionizing radiation: Secondary | ICD-10-CM | POA: Diagnosis not present

## 2023-03-24 DIAGNOSIS — L820 Inflamed seborrheic keratosis: Secondary | ICD-10-CM | POA: Diagnosis not present

## 2023-04-08 DIAGNOSIS — E538 Deficiency of other specified B group vitamins: Secondary | ICD-10-CM | POA: Diagnosis not present

## 2023-04-11 ENCOUNTER — Other Ambulatory Visit: Payer: Self-pay | Admitting: Pulmonary Disease

## 2023-04-23 DIAGNOSIS — E785 Hyperlipidemia, unspecified: Secondary | ICD-10-CM | POA: Diagnosis not present

## 2023-04-23 DIAGNOSIS — J9611 Chronic respiratory failure with hypoxia: Secondary | ICD-10-CM | POA: Diagnosis not present

## 2023-04-23 DIAGNOSIS — R739 Hyperglycemia, unspecified: Secondary | ICD-10-CM | POA: Diagnosis not present

## 2023-04-23 DIAGNOSIS — E039 Hypothyroidism, unspecified: Secondary | ICD-10-CM | POA: Diagnosis not present

## 2023-04-23 DIAGNOSIS — Z9981 Dependence on supplemental oxygen: Secondary | ICD-10-CM | POA: Diagnosis not present

## 2023-04-23 DIAGNOSIS — D539 Nutritional anemia, unspecified: Secondary | ICD-10-CM | POA: Diagnosis not present

## 2023-04-23 DIAGNOSIS — I10 Essential (primary) hypertension: Secondary | ICD-10-CM | POA: Diagnosis not present

## 2023-04-23 DIAGNOSIS — M109 Gout, unspecified: Secondary | ICD-10-CM | POA: Diagnosis not present

## 2023-04-23 DIAGNOSIS — Z139 Encounter for screening, unspecified: Secondary | ICD-10-CM | POA: Diagnosis not present

## 2023-04-23 DIAGNOSIS — I5032 Chronic diastolic (congestive) heart failure: Secondary | ICD-10-CM | POA: Diagnosis not present

## 2023-05-13 DIAGNOSIS — E538 Deficiency of other specified B group vitamins: Secondary | ICD-10-CM | POA: Diagnosis not present

## 2023-06-01 DIAGNOSIS — Z0000 Encounter for general adult medical examination without abnormal findings: Secondary | ICD-10-CM | POA: Diagnosis not present

## 2023-06-01 DIAGNOSIS — Z9181 History of falling: Secondary | ICD-10-CM | POA: Diagnosis not present

## 2023-06-17 DIAGNOSIS — E538 Deficiency of other specified B group vitamins: Secondary | ICD-10-CM | POA: Diagnosis not present

## 2023-07-04 ENCOUNTER — Other Ambulatory Visit: Payer: Self-pay | Admitting: Oncology

## 2023-07-04 DIAGNOSIS — D0511 Intraductal carcinoma in situ of right breast: Secondary | ICD-10-CM

## 2023-07-06 DIAGNOSIS — L821 Other seborrheic keratosis: Secondary | ICD-10-CM | POA: Diagnosis not present

## 2023-07-06 DIAGNOSIS — L578 Other skin changes due to chronic exposure to nonionizing radiation: Secondary | ICD-10-CM | POA: Diagnosis not present

## 2023-07-06 DIAGNOSIS — L820 Inflamed seborrheic keratosis: Secondary | ICD-10-CM | POA: Diagnosis not present

## 2023-07-15 ENCOUNTER — Other Ambulatory Visit: Payer: Self-pay | Admitting: Pulmonary Disease

## 2023-07-22 DIAGNOSIS — E538 Deficiency of other specified B group vitamins: Secondary | ICD-10-CM | POA: Diagnosis not present

## 2023-07-24 ENCOUNTER — Encounter: Payer: Self-pay | Admitting: Pulmonary Disease

## 2023-07-24 ENCOUNTER — Ambulatory Visit: Payer: Medicare HMO | Admitting: Pulmonary Disease

## 2023-07-24 VITALS — BP 140/70 | HR 62 | Ht 62.0 in | Wt 231.2 lb

## 2023-07-24 DIAGNOSIS — R0609 Other forms of dyspnea: Secondary | ICD-10-CM | POA: Diagnosis not present

## 2023-07-24 DIAGNOSIS — G4734 Idiopathic sleep related nonobstructive alveolar hypoventilation: Secondary | ICD-10-CM | POA: Diagnosis not present

## 2023-07-24 MED ORDER — STIOLTO RESPIMAT 2.5-2.5 MCG/ACT IN AERS: 2.0000 | INHALATION_SPRAY | Freq: Every day | RESPIRATORY_TRACT | 3 refills | Status: AC

## 2023-07-24 NOTE — Progress Notes (Signed)
@Patient  ID: Nicole Baldwin, female    DOB: 1940/11/03, 82 y.o.   MRN: [id number]  Chief Complaint  Patient presents with   Follow-up    Pt is here for F/U visit. Pt states her SOB has gotten a Denzer better.    Referring provider: Hurshel Party, NP  HPI:  Nicole Baldwin is a 82 y.o. woman with past medical history of mitral regurg, aortic stenosis, and COVID-19 infection with acute hypoxic respiratory failure 10/2019 now no longer using oxygen during the day, using 2 L nocturnal oxygen whom we are seeing in follow-up for dyspnea on exertion.  Most recent cardiology note reviewed.  Most recent oncology note x 2 reviewed.   Overall, she is doing well.  She continues Stiolto.  This continues to help with her dyspnea especially. PFT results demonstrate air trapping.  She thinks this has helped.  She has more stamina.   She continues wear 2 L nasal nasal cannula at night.    HPI initial visit: She does not symptoms really started shortly after hospitalization for COVID-19 in 10/2019.  Notes reviewed from hospitalization.  She was hypoxemic to 80s on room air so EMS was called.  She was kept in the hospital for a few days on supplemental oxygen.  Her oxygen requirement slowly improved to 1 to 2 L during the day.  She was discharged to complete oral medicines to treat COVID-19.  In interim, she has had waxing and waning of her symptoms.  She and son state that when she was about 10 to 15 pounds lighter, she came completely off oxygen both during the day and at night.  In addition, her dyspnea on exertion seemed improved.  She was walking 5 "laps" up and down the driveway at the time up to 20 laps during the day.  Over the last several months she is slowly regaining weight and is less active walking less during the day.  She checks her oxygen saturations during the day and gets as low as 90-91 with exertion without supplemental oxygen.  At night, her son is checking her oxygen and she is wearing 2 L  nasal cannula to maintain saturations between 93 and 97%.  He states that oxygen saturations are better when she is propped up sitting up in bed as opposed to when she slides down and is more supine.  She sleeps with 2 pillows.  There is a chest x-ray dated 04/20/2020 with report scanned into EMR (cannot review images) with redness and improvement in bilateral airspace opacities compared to 10/2019 at time of hospitalization.  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 Surgical history: Breast cancer resection Family history: No history of breathing issues or lung disease, breast cancer in mother, son with cancers Social history: Lives in [place], retired, never smoker,   Public house manager / Pulmonary Flowsheets:   ACT:      No data to display          MMRC:     No data to display          Epworth:      No data to display          Tests:   FENO:  No results found for: "NITRICOXIDE"  PFT:    Latest Ref Rng & Units 04/18/2022   11:54 AM  PFT Results  FVC-Pre L 1.17   FVC-Predicted Pre % 50   FVC-Post L 1.12   FVC-Predicted Post % 48  Pre FEV1/FVC % % 82   Post FEV1/FCV % % 87   FEV1-Pre L 0.96   FEV1-Predicted Pre % 55   FEV1-Post L 0.97   DLCO uncorrected ml/min/mmHg 11.43   DLCO UNC% % 65   DLCO corrected ml/min/mmHg 11.43   DLCO COR %Predicted % 65   DLVA Predicted % 96   TLC L 4.77   TLC % Predicted % 100   RV % Predicted % 135   Personally reviewed and interpreted as spirometry suggestive of severe restriction versus air trapping.  No bronchodilator response.  TLC within the limits, no restriction.  Air-trapping present with elevated RV and RV to TLC ratio.  DLCO was moderate reduced however volume used to calculate this is 2 L below measured total lung capacity status this is almost certainly normal, underestimated at current reported value.  WALK:      No data to display          Imaging: Chest x-ray 10/2019  with my interpretation as follows: Good inspiration, bilateral infiltrates in lower to mid lung fields  Lab Results: Personally reviewed CBC    Component Value Date/Time   WBC 5.6 09/20/2021 0000   WBC 7.2 10/31/2019 0340   RBC 3.37 (A) 09/20/2021 0000   HGB 10.5 (A) 09/20/2021 0000   HCT 31 (A) 09/20/2021 0000   PLT 210 09/20/2021 0000   MCV 93.0 10/31/2019 0340   MCH 29.4 10/31/2019 0340   MCHC 31.6 10/31/2019 0340   RDW 13.6 10/31/2019 0340   LYMPHSABS 1.3 10/31/2019 0340   MONOABS 1.0 10/31/2019 0340   EOSABS 0.0 10/31/2019 0340   BASOSABS 0.0 10/31/2019 0340    BMET    Component Value Date/Time   NA 138 09/20/2021 0000   K 3.8 09/20/2021 0000   CL 98 (A) 09/20/2021 0000   CO2 36 (A) 09/20/2021 0000   GLUCOSE 93 05/08/2020 1608   GLUCOSE 89 10/31/2019 0340   BUN 12 09/20/2021 0000   CREATININE 1.0 09/20/2021 0000   CREATININE 0.96 05/08/2020 1608   CALCIUM 8.9 09/20/2021 0000   GFRNONAA 56 (L) 05/08/2020 1608   GFRAA 65 05/08/2020 1608    BNP No results found for: "BNP"  ProBNP    Component Value Date/Time   PROBNP 95 04/12/2020 1507    Specialty Problems       Pulmonary Problems   Pneumonia due to COVID-19 virus   Acute bronchitis   Dyspnea on effort   Nocturnal hypoxia    No Known Allergies   There is no immunization history on file for this patient.  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   Mitral regurgitation 04/06/2019   Mitral stenosis 10/12/2018   Nocturnal hypoxia    Osteopenia after menopause 03/21/2022   Otitis media, acute serous    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   aspirin EC 81 MG tablet Take 81 mg by mouth daily.   Calcium Carb-Cholecalciferol 600-200 MG-UNIT TABS Take 1 tablet by mouth 2 (two) times daily.    carvedilol (COREG) 25 MG tablet Take 25 mg by mouth 2 (two) times daily with a meal.   cyanocobalamin (,VITAMIN B-12,) 1000 MCG/ML injection every 30 (thirty) days.   dextromethorphan-guaiFENesin (MUCINEX DM) 30-600 MG 12hr tablet Take 1 tablet by mouth 2 (two) times daily.   ferrous sulfate 324 (65 Fe) MG TBEC Take by mouth.   fluticasone (FLONASE) 50 MCG/ACT nasal spray Place 1 spray into both nostrils in the morning and at bedtime.   levothyroxine (SYNTHROID, LEVOTHROID) 50 MCG tablet Take 50 mcg by mouth daily before breakfast.   lisinopril (PRINIVIL,ZESTRIL) 40 MG tablet Take 40 mg by mouth daily.   Omega-3 Fatty Acids (FISH OIL) 1200 MG CAPS Take 1 capsule by mouth daily.   raloxifene (EVISTA) 60 MG tablet  TAKE 1 TABLET EVERY DAY   torsemide (DEMADEX) 100 MG tablet Take 100 mg by mouth in the morning.   Torsemide 40 MG TABS Take 40 mg by mouth 2 (two) times daily.   [DISCONTINUED] Tiotropium Bromide-Olodaterol (STIOLTO RESPIMAT) 2.5-2.5 MCG/ACT AERS INHALE 2 PUFFS INTO LUNGS ONCE DAILY   Tiotropium Bromide-Olodaterol (STIOLTO RESPIMAT) 2.5-2.5 MCG/ACT AERS Inhale 2 puffs into the lungs daily.   No facility-administered encounter medications on file as of 07/24/2023.     Review of Systems  Review of Systems  N/a  Physical Exam  BP (!) 140/70 (BP Location: Left Arm, Cuff Size: Large)   Pulse 62   Ht 5\' 2"  (1.575 m)   Wt 231 lb 3.2 oz (104.9 kg)   SpO2 95%   BMI 42.29 kg/m   Wt Readings from Last 5 Encounters:  07/24/23 231 lb 3.2 oz (104.9 kg)  02/20/23 235 lb (106.6 kg)  02/05/23 235 lb 9.6 oz (106.9 kg)  09/19/22 229 lb 8 oz (104.1 kg)  07/25/22 228 lb 9.6 oz (103.7 kg)    BMI Readings from Last 5 Encounters:  07/24/23 42.29 kg/m  02/20/23 42.98 kg/m  02/05/23 43.09 kg/m  09/19/22 41.98 kg/m  07/25/22 41.81 kg/m     Physical Exam General: Well-appearing, sitting up in exam table Eyes: EOMI, no icterus Neck: Supple, no JVP appreciated Respiratory: Clear to auscultation bilaterally, no wheezes or crackles Cardiovascular: Regular rate and rhythm, no murmurs,  Abdomen: Soft, bowel sounds present MSK: No synovitis, no joint effusion Neuro: Normal gait, no weakness Psych: Normal mood, full affect    Assessment & Plan:   Dyspnea on exertion: Likely multifactorial  related to cardiac abnormalities given history of mitral regurg, aortic stenosis, obesity/OHS and given chronic elevated bicarbonate in the past and need for nocturnal oxygen.  Also, high suspicion for deconditioning.  PFTs largely normal with exception of air trapping.  Stiolto prescribed 7/23 for bronchodilation with improved symptoms. To continue, refilled.   Nocturnal hypoxemia: largely related to  obesity given she was off oxygen and symptomatically improved at a lower weight and given elevated bicarb on prior BMP suggestive of possible OHS.  She continues 2 L at night.     Return in about 1 year (around 07/23/2024).   Karren Burly, MD 07/24/2023

## 2023-07-24 NOTE — Patient Instructions (Signed)
Nice to see you  Consider Prevnar 20 (newer pneumonia shot) if it has been 5 years since last pneumonia shot  I refilled Stiolto  Return to clinic in 1 year or sooner as needed

## 2023-07-28 DIAGNOSIS — H9192 Unspecified hearing loss, left ear: Secondary | ICD-10-CM | POA: Diagnosis not present

## 2023-08-09 NOTE — Progress Notes (Unsigned)
Cardiology Office Note:    Date:  08/10/2023   ID:  Nicole Baldwin, DOB December 10, 1940, MRN [id number]  PCP:  Hurshel Party, NP  Cardiologist:  Norman Herrlich, MD    Referring MD: Hurshel Party, NP    ASSESSMENT:    1. Hypertensive heart disease with heart failure (HCC)   2. Left bundle branch block   3. Nonrheumatic mitral valve stenosis   4. Morbid obesity (HCC)    PLAN:    In order of problems listed above:  Stable blood pressure is at target and improved no edema on her current loop diuretic torsemide and follow sodium restriction. Most recent creatinine 1.0 potassium 4.5 04/23/2023 with her PCP Continue beta-blocker ACE inhibitor current loop diuretic high dose Stable EKG pattern Stable valvular heart disease With her morbid obesity and nocturnal daytime hypoxemia she would benefit from weight loss I will ask her PCP to consider semaglutide   Next appointment: 6 months   Medication Adjustments/Labs and Tests Ordered: Current medicines are reviewed at length with the patient today.  Concerns regarding medicines are outlined above.  Orders Placed This Encounter  Procedures   EKG 12-Lead   No orders of the defined types were placed in this encounter.    History of Present Illness:    Nicole Baldwin is a 82 y.o. female with a hx of hypertensive heart disease with chronic diastolic heart failure left bundle branch block and mild mitral stenosis calcific degenerative disease last seen 02/05/2023.She subsequently had a repeat echocardiogram performed 10/20/2022 left ventricular ejection fraction low normal 50 to 55% with mild concentric LVH and grade 1 diastolic filling right ventricle normal size and function and pulmonary artery pressure. Aortic valve was calcified sclerotic but no stenosis was noted mean mitral valve gradient was 4 mmHg consistent with mild mitral stenosis.  Compliance with diet, lifestyle and medications: Yes  She follows with pulmonary  she uses nocturnal oxygen during the day her sats are as low as 85% daytime She stopped adding salt to her diet and peripheral edema cleared Overall she feels better remains active and is not having edema orthopnea chest pain palpitation or syncope She is interested in weight loss Past Medical History:  Diagnosis Date   Abdominal distention    Abnormal echocardiogram 02/03/2018   Abnormal weight gain    Acute bronchitis    Adult BMI 37.0-37.9 kg/sq m    Anxiety    At moderate risk for fall    Bilateral edema of lower extremity 02/03/2018   Body mass index 40.0-44.9, adult (HCC)    Chest pain    Chronic diastolic heart failure (HCC)    Depression 02/03/2018   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 Hypertensive heart disease with heart failure (HCC)   2. Left bundle branch block   3. Nonrheumatic mitral valve stenosis   4. Morbid obesity (HCC)    PLAN:    In order of problems listed above:  Stable blood pressure is at target and improved no edema on her current loop diuretic torsemide and follow sodium restriction. Most recent creatinine 1.0 potassium 4.5 04/23/2023 with her PCP Continue beta-blocker ACE inhibitor current loop diuretic high dose Stable EKG pattern Stable valvular heart disease With her morbid obesity and nocturnal daytime hypoxemia she would benefit from weight loss I will ask her PCP to consider semaglutide   Next appointment: 6 months   Medication Adjustments/Labs and Tests Ordered: Current medicines are reviewed at length with the patient today.  Concerns regarding medicines are outlined above.  Orders Placed This Encounter  Procedures   EKG 12-Lead   No orders of the defined types were placed in this encounter.    History of Present Illness:    Nicole Baldwin is a 82 y.o. female with a hx of hypertensive heart disease with chronic diastolic heart failure left bundle branch block and mild mitral stenosis calcific degenerative disease last seen 02/05/2023.She subsequently had a repeat echocardiogram performed 10/20/2022 left ventricular ejection fraction low normal 50 to 55% with mild concentric LVH and grade 1 diastolic filling right ventricle normal size and function and pulmonary artery pressure. Aortic valve was calcified sclerotic but no stenosis was noted mean mitral valve gradient was 4 mmHg consistent with mild mitral stenosis.  Compliance with diet, lifestyle and medications: Yes  She follows with pulmonary  she uses nocturnal oxygen during the day her sats are as low as 85% daytime She stopped adding salt to her diet and peripheral edema cleared Overall she feels better remains active and is not having edema orthopnea chest pain palpitation or syncope She is interested in weight loss Past Medical History:  Diagnosis Date   Abdominal distention    Abnormal echocardiogram 02/03/2018   Abnormal weight gain    Acute bronchitis    Adult BMI 37.0-37.9 kg/sq m    Anxiety    At moderate risk for fall    Bilateral edema of lower extremity 02/03/2018   Body mass index 40.0-44.9, adult (HCC)    Chest pain    Chronic diastolic heart failure (HCC)    Depression 02/03/2018   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   aspirin EC 81 MG tablet Take 81 mg by mouth daily.   Calcium Carb-Cholecalciferol 600-200 MG-UNIT TABS Take 1 tablet by mouth 2 (two) times daily.     carvedilol (COREG) 25 MG tablet Take 25 mg by mouth 2 (two) times daily with a meal.   cyanocobalamin (,VITAMIN B-12,) 1000 MCG/ML injection Inject 1,000 mcg into the skin every 30 (thirty) days.   dextromethorphan-guaiFENesin (MUCINEX DM) 30-600 MG 12hr tablet Take 1 tablet by mouth 2 (two) times daily.   ferrous sulfate 324 (65 Fe) MG TBEC Take 1 tablet by mouth daily.   fluticasone (FLONASE) 50 MCG/ACT nasal spray Place 1 spray into both nostrils in the morning and at bedtime.   levothyroxine (SYNTHROID, LEVOTHROID) 50 MCG tablet Take 50 mcg by mouth daily before breakfast.   lisinopril (PRINIVIL,ZESTRIL) 40 MG tablet Take 40 mg by mouth daily.   Omega-3 Fatty Acids (FISH OIL) 1200 MG CAPS Take 1 capsule by mouth daily.   raloxifene (EVISTA) 60 MG tablet TAKE 1 TABLET EVERY DAY   Tiotropium Bromide-Olodaterol (STIOLTO RESPIMAT) 2.5-2.5 MCG/ACT AERS Inhale 2 puffs into the lungs daily.   torsemide (DEMADEX) 100 MG tablet Take 100 mg by mouth in the morning.   Torsemide 40 MG TABS Take 40 mg by mouth 2 (two) times daily.      EKGs/Labs/Other Studies Reviewed:    The following studies were reviewed today:  Cardiac Studies & Procedures       ECHOCARDIOGRAM  ECHOCARDIOGRAM COMPLETE 10/20/2022  Narrative ECHOCARDIOGRAM REPORT    Patient Name:   Nicole Baldwin Brecheen Date of Exam: 10/20/2022 Medical Rec #:  [id number]               Height:       62.0 in Accession #:    [id number]              Weight:       229.5 lb Date of Birth:  [birth year]              BSA:          2.027 m Patient Age:    81 years                BP:           110/77 mmHg Patient Gender: F                       HR:           65 bpm. Exam Location:  [place]  Procedure: 2D Echo, Cardiac Doppler, Color Doppler and Strain Analysis  Indications:    Hypertensive heart disease with heart failure (HCC) [I11.0 (ICD-10-CM)]; Left bundle branch block [I44.7 (ICD-10-CM)]; Nonrheumatic mitral valve stenosis [I34.2  (ICD-10-CM)]  History:        Patient has prior history of Echocardiogram examinations, most recent 04/03/2021. Mild mitral stenosis, Signs/Symptoms:Hypertensive Heart Disease, Malaise and fatigue and Fever; Risk Factors:Hypertension and Dyslipidemia.  Sonographer:    Louie Boston RDCS Referring Phys: [id number] Baldo Daub   Sonographer Comments: Technically difficult study due to poor echo windows. 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   aspirin EC 81 MG tablet Take 81 mg by mouth daily.   Calcium Carb-Cholecalciferol 600-200 MG-UNIT TABS Take 1 tablet by mouth 2 (two) times daily.     carvedilol (COREG) 25 MG tablet Take 25 mg by mouth 2 (two) times daily with a meal.   cyanocobalamin (,VITAMIN B-12,) 1000 MCG/ML injection Inject 1,000 mcg into the skin every 30 (thirty) days.   dextromethorphan-guaiFENesin (MUCINEX DM) 30-600 MG 12hr tablet Take 1 tablet by mouth 2 (two) times daily.   ferrous sulfate 324 (65 Fe) MG TBEC Take 1 tablet by mouth daily.   fluticasone (FLONASE) 50 MCG/ACT nasal spray Place 1 spray into both nostrils in the morning and at bedtime.   levothyroxine (SYNTHROID, LEVOTHROID) 50 MCG tablet Take 50 mcg by mouth daily before breakfast.   lisinopril (PRINIVIL,ZESTRIL) 40 MG tablet Take 40 mg by mouth daily.   Omega-3 Fatty Acids (FISH OIL) 1200 MG CAPS Take 1 capsule by mouth daily.   raloxifene (EVISTA) 60 MG tablet TAKE 1 TABLET EVERY DAY   Tiotropium Bromide-Olodaterol (STIOLTO RESPIMAT) 2.5-2.5 MCG/ACT AERS Inhale 2 puffs into the lungs daily.   torsemide (DEMADEX) 100 MG tablet Take 100 mg by mouth in the morning.   Torsemide 40 MG TABS Take 40 mg by mouth 2 (two) times daily.      EKGs/Labs/Other Studies Reviewed:    The following studies were reviewed today:  Cardiac Studies & Procedures       ECHOCARDIOGRAM  ECHOCARDIOGRAM COMPLETE 10/20/2022  Narrative ECHOCARDIOGRAM REPORT    Patient Name:   Nicole Baldwin Brecheen Date of Exam: 10/20/2022 Medical Rec #:  [id number]               Height:       62.0 in Accession #:    [id number]              Weight:       229.5 lb Date of Birth:  [birth year]              BSA:          2.027 m Patient Age:    81 years                BP:           110/77 mmHg Patient Gender: F                       HR:           65 bpm. Exam Location:  [place]  Procedure: 2D Echo, Cardiac Doppler, Color Doppler and Strain Analysis  Indications:    Hypertensive heart disease with heart failure (HCC) [I11.0 (ICD-10-CM)]; Left bundle branch block [I44.7 (ICD-10-CM)]; Nonrheumatic mitral valve stenosis [I34.2  (ICD-10-CM)]  History:        Patient has prior history of Echocardiogram examinations, most recent 04/03/2021. Mild mitral stenosis, Signs/Symptoms:Hypertensive Heart Disease, Malaise and fatigue and Fever; Risk Factors:Hypertension and Dyslipidemia.  Sonographer:    Louie Boston RDCS Referring Phys: [id number] Baldo Daub   Sonographer Comments: Technically difficult study due to poor echo windows. 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   aspirin EC 81 MG tablet Take 81 mg by mouth daily.   Calcium Carb-Cholecalciferol 600-200 MG-UNIT TABS Take 1 tablet by mouth 2 (two) times daily.     carvedilol (COREG) 25 MG tablet Take 25 mg by mouth 2 (two) times daily with a meal.   cyanocobalamin (,VITAMIN B-12,) 1000 MCG/ML injection Inject 1,000 mcg into the skin every 30 (thirty) days.   dextromethorphan-guaiFENesin (MUCINEX DM) 30-600 MG 12hr tablet Take 1 tablet by mouth 2 (two) times daily.   ferrous sulfate 324 (65 Fe) MG TBEC Take 1 tablet by mouth daily.   fluticasone (FLONASE) 50 MCG/ACT nasal spray Place 1 spray into both nostrils in the morning and at bedtime.   levothyroxine (SYNTHROID, LEVOTHROID) 50 MCG tablet Take 50 mcg by mouth daily before breakfast.   lisinopril (PRINIVIL,ZESTRIL) 40 MG tablet Take 40 mg by mouth daily.   Omega-3 Fatty Acids (FISH OIL) 1200 MG CAPS Take 1 capsule by mouth daily.   raloxifene (EVISTA) 60 MG tablet TAKE 1 TABLET EVERY DAY   Tiotropium Bromide-Olodaterol (STIOLTO RESPIMAT) 2.5-2.5 MCG/ACT AERS Inhale 2 puffs into the lungs daily.   torsemide (DEMADEX) 100 MG tablet Take 100 mg by mouth in the morning.   Torsemide 40 MG TABS Take 40 mg by mouth 2 (two) times daily.      EKGs/Labs/Other Studies Reviewed:    The following studies were reviewed today:  Cardiac Studies & Procedures       ECHOCARDIOGRAM  ECHOCARDIOGRAM COMPLETE 10/20/2022  Narrative ECHOCARDIOGRAM REPORT    Patient Name:   Nicole Baldwin Brecheen Date of Exam: 10/20/2022 Medical Rec #:  [id number]               Height:       62.0 in Accession #:    [id number]              Weight:       229.5 lb Date of Birth:  [birth year]              BSA:          2.027 m Patient Age:    81 years                BP:           110/77 mmHg Patient Gender: F                       HR:           65 bpm. Exam Location:  [place]  Procedure: 2D Echo, Cardiac Doppler, Color Doppler and Strain Analysis  Indications:    Hypertensive heart disease with heart failure (HCC) [I11.0 (ICD-10-CM)]; Left bundle branch block [I44.7 (ICD-10-CM)]; Nonrheumatic mitral valve stenosis [I34.2  (ICD-10-CM)]  History:        Patient has prior history of Echocardiogram examinations, most recent 04/03/2021. Mild mitral stenosis, Signs/Symptoms:Hypertensive Heart Disease, Malaise and fatigue and Fever; Risk Factors:Hypertension and Dyslipidemia.  Sonographer:    Louie Boston RDCS Referring Phys: [id number] Baldo Daub   Sonographer Comments: Technically difficult study due to poor echo windows. IMPRESSIONS

## 2023-08-10 ENCOUNTER — Ambulatory Visit: Payer: Medicare HMO | Attending: Cardiology | Admitting: Cardiology

## 2023-08-10 ENCOUNTER — Encounter: Payer: Self-pay | Admitting: Cardiology

## 2023-08-10 VITALS — BP 110/62 | HR 69 | Ht 62.0 in | Wt 229.0 lb

## 2023-08-10 DIAGNOSIS — I342 Nonrheumatic mitral (valve) stenosis: Secondary | ICD-10-CM

## 2023-08-10 DIAGNOSIS — I110 Hypertensive heart disease with heart failure: Secondary | ICD-10-CM | POA: Diagnosis not present

## 2023-08-10 DIAGNOSIS — I447 Left bundle-branch block, unspecified: Secondary | ICD-10-CM | POA: Diagnosis not present

## 2023-08-10 NOTE — Patient Instructions (Signed)

## 2023-08-17 DIAGNOSIS — Z1231 Encounter for screening mammogram for malignant neoplasm of breast: Secondary | ICD-10-CM | POA: Diagnosis not present

## 2023-08-17 LAB — HM MAMMOGRAPHY

## 2023-08-25 DIAGNOSIS — E039 Hypothyroidism, unspecified: Secondary | ICD-10-CM | POA: Diagnosis not present

## 2023-08-25 DIAGNOSIS — E785 Hyperlipidemia, unspecified: Secondary | ICD-10-CM | POA: Diagnosis not present

## 2023-08-25 DIAGNOSIS — J9611 Chronic respiratory failure with hypoxia: Secondary | ICD-10-CM | POA: Diagnosis not present

## 2023-08-25 DIAGNOSIS — R739 Hyperglycemia, unspecified: Secondary | ICD-10-CM | POA: Diagnosis not present

## 2023-08-25 DIAGNOSIS — Z9981 Dependence on supplemental oxygen: Secondary | ICD-10-CM | POA: Diagnosis not present

## 2023-08-25 DIAGNOSIS — D539 Nutritional anemia, unspecified: Secondary | ICD-10-CM | POA: Diagnosis not present

## 2023-08-25 DIAGNOSIS — I10 Essential (primary) hypertension: Secondary | ICD-10-CM | POA: Diagnosis not present

## 2023-08-25 DIAGNOSIS — E538 Deficiency of other specified B group vitamins: Secondary | ICD-10-CM | POA: Diagnosis not present

## 2023-08-25 DIAGNOSIS — I5032 Chronic diastolic (congestive) heart failure: Secondary | ICD-10-CM | POA: Diagnosis not present

## 2023-08-26 DIAGNOSIS — J449 Chronic obstructive pulmonary disease, unspecified: Secondary | ICD-10-CM | POA: Diagnosis not present

## 2023-08-26 DIAGNOSIS — Z86000 Personal history of in-situ neoplasm of breast: Secondary | ICD-10-CM | POA: Diagnosis not present

## 2023-08-26 DIAGNOSIS — Z09 Encounter for follow-up examination after completed treatment for conditions other than malignant neoplasm: Secondary | ICD-10-CM | POA: Diagnosis not present

## 2023-08-26 DIAGNOSIS — E538 Deficiency of other specified B group vitamins: Secondary | ICD-10-CM | POA: Diagnosis not present

## 2023-09-21 DIAGNOSIS — Z9622 Myringotomy tube(s) status: Secondary | ICD-10-CM | POA: Diagnosis not present

## 2023-09-21 DIAGNOSIS — H903 Sensorineural hearing loss, bilateral: Secondary | ICD-10-CM | POA: Diagnosis not present

## 2023-09-30 DIAGNOSIS — E538 Deficiency of other specified B group vitamins: Secondary | ICD-10-CM | POA: Diagnosis not present

## 2023-11-11 DIAGNOSIS — E538 Deficiency of other specified B group vitamins: Secondary | ICD-10-CM | POA: Diagnosis not present

## 2023-12-16 DIAGNOSIS — E538 Deficiency of other specified B group vitamins: Secondary | ICD-10-CM | POA: Diagnosis not present

## 2023-12-29 DIAGNOSIS — I10 Essential (primary) hypertension: Secondary | ICD-10-CM | POA: Diagnosis not present

## 2023-12-29 DIAGNOSIS — E785 Hyperlipidemia, unspecified: Secondary | ICD-10-CM | POA: Diagnosis not present

## 2023-12-29 DIAGNOSIS — R739 Hyperglycemia, unspecified: Secondary | ICD-10-CM | POA: Diagnosis not present

## 2023-12-29 DIAGNOSIS — J9611 Chronic respiratory failure with hypoxia: Secondary | ICD-10-CM | POA: Diagnosis not present

## 2023-12-29 DIAGNOSIS — D539 Nutritional anemia, unspecified: Secondary | ICD-10-CM | POA: Diagnosis not present

## 2023-12-29 DIAGNOSIS — I5032 Chronic diastolic (congestive) heart failure: Secondary | ICD-10-CM | POA: Diagnosis not present

## 2023-12-29 DIAGNOSIS — E559 Vitamin D deficiency, unspecified: Secondary | ICD-10-CM | POA: Diagnosis not present

## 2023-12-29 DIAGNOSIS — Z9981 Dependence on supplemental oxygen: Secondary | ICD-10-CM | POA: Diagnosis not present

## 2023-12-29 DIAGNOSIS — E039 Hypothyroidism, unspecified: Secondary | ICD-10-CM | POA: Diagnosis not present

## 2023-12-29 DIAGNOSIS — E538 Deficiency of other specified B group vitamins: Secondary | ICD-10-CM | POA: Diagnosis not present

## 2024-01-20 DIAGNOSIS — E538 Deficiency of other specified B group vitamins: Secondary | ICD-10-CM | POA: Diagnosis not present

## 2024-02-07 DIAGNOSIS — S42202A Unspecified fracture of upper end of left humerus, initial encounter for closed fracture: Secondary | ICD-10-CM | POA: Diagnosis not present

## 2024-02-07 DIAGNOSIS — W19XXXA Unspecified fall, initial encounter: Secondary | ICD-10-CM | POA: Diagnosis not present

## 2024-02-08 DIAGNOSIS — W19XXXA Unspecified fall, initial encounter: Secondary | ICD-10-CM | POA: Diagnosis not present

## 2024-02-08 DIAGNOSIS — S42212A Unspecified displaced fracture of surgical neck of left humerus, initial encounter for closed fracture: Secondary | ICD-10-CM | POA: Diagnosis not present

## 2024-02-08 DIAGNOSIS — S42202A Unspecified fracture of upper end of left humerus, initial encounter for closed fracture: Secondary | ICD-10-CM | POA: Diagnosis not present

## 2024-02-10 DIAGNOSIS — S42202A Unspecified fracture of upper end of left humerus, initial encounter for closed fracture: Secondary | ICD-10-CM | POA: Diagnosis not present

## 2024-02-22 ENCOUNTER — Telehealth: Payer: Self-pay | Admitting: Oncology

## 2024-02-22 NOTE — Telephone Encounter (Signed)
 02/22/24 Patient rescheduled appt recovering from a broken arm.

## 2024-02-24 DIAGNOSIS — S42202A Unspecified fracture of upper end of left humerus, initial encounter for closed fracture: Secondary | ICD-10-CM | POA: Diagnosis not present

## 2024-02-24 DIAGNOSIS — E538 Deficiency of other specified B group vitamins: Secondary | ICD-10-CM | POA: Diagnosis not present

## 2024-02-26 ENCOUNTER — Ambulatory Visit: Payer: Medicare HMO | Admitting: Oncology

## 2024-02-29 ENCOUNTER — Ambulatory Visit: Admitting: Cardiology

## 2024-03-08 DIAGNOSIS — S42202A Unspecified fracture of upper end of left humerus, initial encounter for closed fracture: Secondary | ICD-10-CM | POA: Diagnosis not present

## 2024-03-15 DIAGNOSIS — M25412 Effusion, left shoulder: Secondary | ICD-10-CM | POA: Diagnosis not present

## 2024-03-15 DIAGNOSIS — M25612 Stiffness of left shoulder, not elsewhere classified: Secondary | ICD-10-CM | POA: Diagnosis not present

## 2024-03-15 DIAGNOSIS — M25512 Pain in left shoulder: Secondary | ICD-10-CM | POA: Diagnosis not present

## 2024-03-22 DIAGNOSIS — M25412 Effusion, left shoulder: Secondary | ICD-10-CM | POA: Diagnosis not present

## 2024-03-22 DIAGNOSIS — M25612 Stiffness of left shoulder, not elsewhere classified: Secondary | ICD-10-CM | POA: Diagnosis not present

## 2024-03-22 DIAGNOSIS — M25512 Pain in left shoulder: Secondary | ICD-10-CM | POA: Diagnosis not present

## 2024-03-29 DIAGNOSIS — M25512 Pain in left shoulder: Secondary | ICD-10-CM | POA: Diagnosis not present

## 2024-03-29 DIAGNOSIS — M25412 Effusion, left shoulder: Secondary | ICD-10-CM | POA: Diagnosis not present

## 2024-03-29 DIAGNOSIS — M25612 Stiffness of left shoulder, not elsewhere classified: Secondary | ICD-10-CM | POA: Diagnosis not present

## 2024-03-30 DIAGNOSIS — E538 Deficiency of other specified B group vitamins: Secondary | ICD-10-CM | POA: Diagnosis not present

## 2024-04-05 DIAGNOSIS — M25412 Effusion, left shoulder: Secondary | ICD-10-CM | POA: Diagnosis not present

## 2024-04-05 DIAGNOSIS — S42202A Unspecified fracture of upper end of left humerus, initial encounter for closed fracture: Secondary | ICD-10-CM | POA: Diagnosis not present

## 2024-04-05 DIAGNOSIS — M25512 Pain in left shoulder: Secondary | ICD-10-CM | POA: Diagnosis not present

## 2024-04-05 DIAGNOSIS — M25612 Stiffness of left shoulder, not elsewhere classified: Secondary | ICD-10-CM | POA: Diagnosis not present

## 2024-04-12 DIAGNOSIS — M25512 Pain in left shoulder: Secondary | ICD-10-CM | POA: Diagnosis not present

## 2024-04-12 DIAGNOSIS — M25612 Stiffness of left shoulder, not elsewhere classified: Secondary | ICD-10-CM | POA: Diagnosis not present

## 2024-04-12 DIAGNOSIS — M25412 Effusion, left shoulder: Secondary | ICD-10-CM | POA: Diagnosis not present

## 2024-04-23 ENCOUNTER — Other Ambulatory Visit: Payer: Self-pay | Admitting: Oncology

## 2024-04-23 DIAGNOSIS — D0511 Intraductal carcinoma in situ of right breast: Secondary | ICD-10-CM

## 2024-04-25 DIAGNOSIS — Z9622 Myringotomy tube(s) status: Secondary | ICD-10-CM | POA: Diagnosis not present

## 2024-04-25 DIAGNOSIS — H6121 Impacted cerumen, right ear: Secondary | ICD-10-CM | POA: Diagnosis not present

## 2024-04-26 DIAGNOSIS — M25412 Effusion, left shoulder: Secondary | ICD-10-CM | POA: Diagnosis not present

## 2024-04-26 DIAGNOSIS — M25612 Stiffness of left shoulder, not elsewhere classified: Secondary | ICD-10-CM | POA: Diagnosis not present

## 2024-04-26 DIAGNOSIS — M25512 Pain in left shoulder: Secondary | ICD-10-CM | POA: Diagnosis not present

## 2024-05-02 ENCOUNTER — Ambulatory Visit

## 2024-05-02 ENCOUNTER — Ambulatory Visit: Admitting: Cardiology

## 2024-05-02 VITALS — BP 110/60 | HR 65 | Ht 62.0 in | Wt 218.0 lb

## 2024-05-02 DIAGNOSIS — I342 Nonrheumatic mitral (valve) stenosis: Secondary | ICD-10-CM | POA: Diagnosis not present

## 2024-05-02 DIAGNOSIS — I10 Essential (primary) hypertension: Secondary | ICD-10-CM | POA: Diagnosis not present

## 2024-05-02 DIAGNOSIS — E782 Mixed hyperlipidemia: Secondary | ICD-10-CM

## 2024-05-02 DIAGNOSIS — I5032 Chronic diastolic (congestive) heart failure: Secondary | ICD-10-CM | POA: Diagnosis not present

## 2024-05-02 NOTE — Assessment & Plan Note (Addendum)
 Mild mitral stenosis on echocardiogram from January 2024. Follow-up repeat echocardiogram prior to next follow-up visit tentatively in 1 year.  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

## 2024-05-02 NOTE — Patient Instructions (Signed)
 Medication Instructions:  Your physician has recommended you make the following change in your medication:   START: Torsemide  100 mg daily every morning START: Torsemide  40 mg daily every afternoon  *If you need a refill on your cardiac medications before your next appointment, please call your pharmacy*  Lab Work: None If you have labs (blood work) drawn today and your tests are completely normal, you will receive your results only by: MyChart Message (if you have MyChart) OR A paper copy in the mail If you have any lab test that is abnormal or we need to change your treatment, we will call you to review the results.  Testing/Procedures: Your physician has requested that you have an echocardiogram. Echocardiography is a painless test that uses sound waves to create images of your heart. It provides your doctor with information about the size and shape of your heart and how well your heart's chambers and valves are working. This procedure takes approximately one hour. There are no restrictions for this procedure. Please do NOT wear cologne, perfume, aftershave, or lotions (deodorant is allowed). Please arrive 15 minutes prior to your appointment time.  Please note: We ask at that you not bring children with you during ultrasound (echo/ vascular) testing. Due to room size and safety concerns, children are not allowed in the ultrasound rooms during exams. Our front office staff cannot provide observation of children in our lobby area while testing is being conducted. An adult accompanying a patient to their appointment will only be allowed in the ultrasound room at the discretion of the ultrasound technician under special circumstances. We apologize for any inconvenience.   Follow-Up: At [facility], you and your health needs are our priority.  As part of our continuing mission to provide you with exceptional heart care, our providers are all part of one team.  This team includes your  primary Cardiologist (physician) and Advanced Practice Providers or APPs (Physician Assistants and Nurse Practitioners) who all work together to provide you with the care you need, when you need it.  Your next appointment:   1 year(s)  Provider:   Alean Kobus, MD    We recommend signing up for the patient portal called MyChart.  Sign up information is provided on this After Visit Summary.  MyChart is used to connect with patients for Virtual Visits (Telemedicine).  Patients are able to view lab/test results, encounter notes, upcoming appointments, etc.  Non-urgent messages can be sent to your provider as well.   To learn more about what you can do with MyChart, go to [email or web address].   Other Instructions None

## 2024-05-02 NOTE — Assessment & Plan Note (Signed)
 Well-controlled. Continue current medications carvedilol  25 mg twice daily and lisinopril 40 mg once daily. Target blood pressure below 130/80 mmHg.

## 2024-05-02 NOTE — Assessment & Plan Note (Signed)
 Lipid panel from 12/29/2023 total cholesterol 235, HDL 40, LDL 161 and triglycerides 816. Not optimal. Mentions she was previously taking statins. Recommended to start statins.  She is at this time hesitant and will review with her PCP at subsequent follow-up visit and wishes to begin after that. Recommendation would be to start Crestor 10 mg once daily and target LDL levels below 100 mg/dL.

## 2024-05-02 NOTE — Assessment & Plan Note (Signed)
 Euvolemic and compensated Last echocardiogram January 2024 with normal biventricular function.  Functional status at baseline has been doing well, NYHA class II.  Continue with salt restriction below 2 g/day. Remains on relatively high dose diuretic regimen with torsemide  100 mg in the morning and 40 mg in the afternoon.  Last blood work blood work from 12/29/2023 with creatinine 1.22 and EGFR 44.  Continue with carvedilol  25 mg twice daily Continue lisinopril 40 mg once daily. With current kidney function and stable cardiac function we will hold off on switching to Entresto.

## 2024-05-02 NOTE — Progress Notes (Signed)
 Cardiology Consultation:    Date:  05/02/2024   ID:  Nicole Baldwin, DOB August 12, 1941, MRN [id number]  PCP:  Nicole Greig LABOR, NP  Cardiologist:  Nicole Baldwin Nicole Burbridge, MD   Referring MD: Nicole Greig LABOR, NP   Chief Complaint  Patient presents with   Follow-up     ASSESSMENT AND PLAN:   Nicole Baldwin 83 year old with history of chronic diastolic heart failure, mild mitral stenosis [mean gradient 4 mmHg January 2024], aortic sclerosis with borderline aortic stenosis, hypertension, hyperlipidemia, hypothyroidism, LBBB, CKD stage III, anxiety, depression, obesity. 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  Overall no significant change in comparison to prior echocardiogram from June 2022.   Here for follow-up visit, no active issues. Problem List Items Addressed This Visit     Chronic diastolic heart failure (HCC) - Primary   Euvolemic and compensated Last echocardiogram January 2024 with normal biventricular function.  Functional status at baseline has been doing well, NYHA class II.  Continue with salt restriction below 2 g/day. Remains on relatively high dose diuretic regimen with torsemide  100 mg in the morning and 40 mg in the afternoon.  Last blood work blood work from 12/29/2023 with creatinine 1.22 and EGFR 44.  Continue with carvedilol  25 mg twice daily Continue lisinopril 40 mg once daily. With current kidney function and stable cardiac function we will hold off on switching to Entresto.        Relevant Medications   torsemide  (DEMADEX ) 20 MG tablet   Other Relevant Orders   ECHOCARDIOGRAM COMPLETE   Mitral stenosis   Mild mitral stenosis on echocardiogram from January 2024. Follow-up repeat echocardiogram prior to next follow-up visit tentatively  in 1 year.  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        Relevant Medications   torsemide  (DEMADEX ) 20 MG tablet   Other Relevant Orders   ECHOCARDIOGRAM COMPLETE   Hyperlipidemia   Lipid panel from 12/29/2023 total cholesterol 235, HDL 40, LDL 161 and triglycerides 816. Not optimal. Mentions she was previously taking statins. Recommended to start statins.  She is at this time hesitant and will review with her PCP at subsequent follow-up visit and wishes to begin after that. Recommendation would be to start Crestor 10 mg once daily and target LDL levels below 100 mg/dL.       Relevant Medications   torsemide  (DEMADEX ) 20 MG tablet   Other Relevant Orders   ECHOCARDIOGRAM COMPLETE   Essential hypertension   Well-controlled. Continue current medications carvedilol  25 mg twice daily and lisinopril 40 mg once daily. Target blood pressure below 130/80 mmHg.       Relevant Medications   torsemide  (DEMADEX ) 20 MG tablet   Other Relevant Orders   ECHOCARDIOGRAM COMPLETE      History of Present Illness:    Nicole Baldwin is Baldwin 83 y.o. female who is being seen today for follow-up visit. PCP Moon, Amy A, NP. Last visit with us  in the office 08/10/2023 with Nicole Baldwin.  Lives with her son at home.  Able to walk and do her day-to-day activities.  Sustained Baldwin fall getting her foot stuck in the rug resulting in shoulder injury, being conservatively managed recovering and undergoing physical therapy.  History  of chronic diastolic heart failure, mild mitral stenosis [mean gradient 4 mmHg January 2024], aortic sclerosis with borderline aortic stenosis, CKD stage III, hypertension, hyperlipidemia, hypothyroidism, LBBB anxiety, depression, obesity. 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  Overall no significant change in comparison to prior echocardiogram from June 2022.  Overall mentions things have been stable from cardiac standpoint. Denies any pedal edema. Continues to take quite Baldwin significant dose of torsemide  100 mg in the morning and 40 mg in the evening. Mentions no significant fluid buildup. Denies any chest pain, shortness of breath, orthopnea or paroxysmal nocturnal dyspnea.  She denies any palpitations, syncopal or near syncopal episodes.  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   Mitral regurgitation 04/06/2019   Mitral stenosis 10/12/2018   Nocturnal hypoxia    Osteopenia after menopause 03/21/2022   Otitis media, acute serous    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   aspirin  EC 81 MG tablet Take 81 mg by mouth daily.   Calcium  Carb-Cholecalciferol  600-200 MG-UNIT TABS Take 1 tablet by mouth 2 (two) times daily.    carvedilol  (COREG ) 25 MG tablet Take 25 mg by mouth 2 (two) times daily with Baldwin meal.   cyanocobalamin  (,VITAMIN B-12,) 1000 MCG/ML injection Inject 1,000 mcg into the skin every 30 (thirty) days.   dextromethorphan-guaiFENesin  (MUCINEX  DM) 30-600 MG 12hr tablet Take 1 tablet by mouth 2 (two) times daily.   ferrous sulfate 324 (65 Fe) MG TBEC Take 1 tablet by mouth daily.   fluticasone (FLONASE) 50 MCG/ACT nasal spray Place 1 spray into both nostrils in the morning and at bedtime.   levothyroxine  (SYNTHROID , LEVOTHROID) 50 MCG tablet Take 50 mcg by mouth  daily before breakfast.   lisinopril (PRINIVIL,ZESTRIL) 40 MG tablet Take 40 mg by mouth daily.   Omega-3 Fatty Acids (FISH OIL) 1200 MG CAPS Take 1 capsule by mouth daily.   raloxifene  (EVISTA ) 60 MG tablet TAKE 1 TABLET EVERY DAY   Tiotropium Bromide-Olodaterol (STIOLTO RESPIMAT ) 2.5-2.5 MCG/ACT AERS Inhale 2 puffs into the lungs daily.   torsemide  (DEMADEX ) 100 MG tablet Take 100 mg by mouth in the morning.   torsemide  (DEMADEX ) 20 MG tablet Take 40 mg by mouth daily in the afternoon.   tretinoin (RETIN-Baldwin) 0.1 % cream Apply 1 Application topically at bedtime.   [DISCONTINUED] Torsemide  40 MG TABS Take 40 mg by mouth 2 (two) times daily.     Allergies:   Patient has no known allergies.   Social History   Socioeconomic History   Marital status: Widowed    Spouse name: Not on file   Number of children: Not on file   Years of education: Not on file   Highest education level: Not on file   Occupational History   Not on file  Tobacco Use   Smoking status: Never    Passive exposure: Past   Smokeless tobacco: Never  Vaping Use   Vaping status: Never Used  Substance and Sexual Activity   Alcohol use: Not Currently   Drug use: Not Currently   Sexual activity: Not on file  Other Topics Concern   Not on file  Social History Narrative   Not on file   Social Drivers of Health   Financial Resource Strain: Not on file  Food Insecurity: Not on file  Transportation Needs: Not on file  Physical Activity: Not on file  Stress: Not on file  Social Connections: Not on file     Family History: The patient's family history includes Breast cancer in her mother; Colon cancer in her son; Diabetes in her brother; Hypertension in her father; Stomach cancer in her son; Testicular cancer in her son. ROS:   Please see the history of present illness.    All 14 point review of systems negative except as described per history of present illness.  EKGs/Labs/Other Studies Reviewed:    The following studies were reviewed today:     EKG:       Recent Labs: No results found for requested labs within last 365 days.  Recent Lipid Panel No results found for: CHOL, TRIG, HDL, CHOLHDL, VLDL, LDLCALC, LDLDIRECT  Physical Exam:    VS:  BP 110/60   Pulse 65   Ht 5' 2 (1.575 m)   Wt 218 lb (98.9 kg)   SpO2 90%   BMI 39.87 kg/m     Wt Readings from Last 3 Encounters:  05/02/24 218 lb (98.9 kg)  08/10/23 229 lb (103.9 kg)  07/24/23 231 lb 3.2 oz (104.9 kg)     GENERAL:  Well nourished, well developed in no acute distress NECK: No JVD; No carotid bruits CARDIAC: RRR, S1 and S2 present, no murmurs, no rubs, no gallops CHEST:  Clear to auscultation without rales, wheezing or rhonchi  Extremities: No pitting pedal edema. Pulses bilaterally symmetric with radial 2+ and dorsalis pedis 2+ NEUROLOGIC:  Alert and oriented x 3  Medication Adjustments/Labs and Tests  Ordered: Current medicines are reviewed at length with the patient today.  Concerns regarding medicines are outlined above.  Orders Placed This Encounter  Procedures   ECHOCARDIOGRAM COMPLETE   No orders of the defined types were placed in this encounter.   Signed,  Nicole reddy Rhya Shan, MD, MPH, [facility]. 05/02/2024 3:43 PM    [facility]

## 2024-05-05 DIAGNOSIS — M25412 Effusion, left shoulder: Secondary | ICD-10-CM | POA: Diagnosis not present

## 2024-05-05 DIAGNOSIS — M25612 Stiffness of left shoulder, not elsewhere classified: Secondary | ICD-10-CM | POA: Diagnosis not present

## 2024-05-05 DIAGNOSIS — M25512 Pain in left shoulder: Secondary | ICD-10-CM | POA: Diagnosis not present

## 2024-05-11 DIAGNOSIS — E538 Deficiency of other specified B group vitamins: Secondary | ICD-10-CM | POA: Diagnosis not present

## 2024-05-12 DIAGNOSIS — M25412 Effusion, left shoulder: Secondary | ICD-10-CM | POA: Diagnosis not present

## 2024-05-12 DIAGNOSIS — M25612 Stiffness of left shoulder, not elsewhere classified: Secondary | ICD-10-CM | POA: Diagnosis not present

## 2024-05-12 DIAGNOSIS — M25512 Pain in left shoulder: Secondary | ICD-10-CM | POA: Diagnosis not present

## 2024-05-19 DIAGNOSIS — M25612 Stiffness of left shoulder, not elsewhere classified: Secondary | ICD-10-CM | POA: Diagnosis not present

## 2024-05-19 DIAGNOSIS — M25512 Pain in left shoulder: Secondary | ICD-10-CM | POA: Diagnosis not present

## 2024-05-19 DIAGNOSIS — M25412 Effusion, left shoulder: Secondary | ICD-10-CM | POA: Diagnosis not present

## 2024-05-23 DIAGNOSIS — S42202A Unspecified fracture of upper end of left humerus, initial encounter for closed fracture: Secondary | ICD-10-CM | POA: Diagnosis not present

## 2024-05-25 NOTE — Progress Notes (Signed)
 [facility]  [address] 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  Current Treatment:    Chemoprevention with raloxifene 60 mg daily  HISTORY OF PRESENT ILLNESS:  Nicole Baldwin is a 83 y.o. 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  Annual screening mammogram in August 2020 revealed a suspicious possible mass of the right breast.  Diagnostic right mammogram and ultrasound confirmed a hypoechoic oval mass with indistinct margins measuring 5 x 4 x 4 mm at 1 o'clock in the right breast, 6 cm from the nipple. Ultrasound guided biopsy in September revealed this to be a papillary carcinoma, at least in situ.  Estrogen receptor was 100% positive and progesterone receptor was 70% positive.  She underwent lumpectomy in October.  Pathology revealed 0.9 cm, intermediate grade, ductal carcinoma in situ with negative margin.  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  She was placed on chemoprevention with raloxifene 60 mg daily in November 2020 and this is planned for 5 years.    She contracted COVID in January 2021 and was hospitalized for 4 days.  She was sent home on oxygen, but was able to be weaned off of this.  Annual bilateral mammogram from August did not reveal any evidence of malignancy.  Bone density scan revealed worsening osteopenia with a T-score of -1.9 of the left femur neck, previously -1.1, a T-score of -0.3 in the total femur, which is normal, and a T-score of -1.2 in the spine. which was stable.  She is taking calcium 600 mg and vitamin D3 1000 international units daily.  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 Patient states that she feels ok but complains left arm pain rating 4/10. She informed me that she had fractured her upper end of left humerus and did not want surgery so she is going through physical therapy. She states that she is healing slowly. Due to this we will postpone her mammogram until next year as she is not able to lift her left arm fully. She will have completed her 5 years of Raloxifene this November and I informed her after she finishes her current 3 month bottle she can stop this. She will be due for repeat bone density scan in November but will postpone that. I noticed a raised erythematous nodule on her mid sternum and I recommended she have this evaluated by dermatology. Her PCP does routine labs and she will meet with her on 06/10/2024. I will see her back in 1 year with bone density for our long term survivorship clinic.   She denies fever, chills, night sweats, or other signs of infection. She denies cardiorespiratory and gastrointestinal issues. She  denies pain. Her appetite is good and Her weight has decreased 18 pounds over last year.   REVIEW OF SYSTEMS:  Review of Systems  Constitutional: Negative.  Negative for appetite change, chills, diaphoresis, fatigue, fever and unexpected weight change.  HENT:  Negative.  Negative for hearing loss, lump/mass, mouth sores, nosebleeds, sore throat, tinnitus, trouble swallowing  and voice change.   Eyes: Negative.  Negative for eye problems and icterus.  Respiratory: Negative.  Negative for chest tightness, cough, hemoptysis, shortness of breath and wheezing.   Cardiovascular:  Negative for chest pain, leg swelling and palpitations.  Gastrointestinal: Negative.  Negative for abdominal distention, abdominal pain, blood in stool, constipation, diarrhea, nausea, rectal pain and vomiting.  Endocrine: Negative.   Genitourinary: Negative.  Negative for  bladder incontinence, difficulty urinating, dyspareunia, dysuria, frequency, hematuria, menstrual problem, nocturia, pelvic pain, vaginal bleeding and vaginal discharge.   Musculoskeletal:  Positive for arthralgias (of the left knee). Negative for back pain, flank pain, gait problem, myalgias, neck pain and neck stiffness.  Skin: Negative.  Negative for itching, rash and wound.  Neurological: Negative.  Negative for dizziness, extremity weakness, gait problem, headaches, light-headedness, numbness, seizures and speech difficulty.  Hematological: Negative.  Negative for adenopathy. Does not bruise/bleed easily.  Psychiatric/Behavioral: Negative.  Negative for confusion, decreased concentration, depression, sleep disturbance and suicidal ideas. The patient is not nervous/anxious.      VITALS:  Blood pressure 123/64, pulse 64, temperature 97.9 F (36.6 C), temperature source Oral, resp. rate 18, height 5' 2 (1.575 m), weight 217 lb 14.4 oz (98.8 kg).  Wt Readings from Last 3 Encounters:  05/31/24 217 lb 14.4 oz (98.8 kg)  05/02/24 218 lb (98.9 kg)  08/10/23 229 lb (103.9 kg)    Body mass index is 39.85 kg/m.  Performance status (ECOG): 1 - Symptomatic but completely ambulatory  PHYSICAL EXAM:  Physical Exam Vitals and nursing note reviewed.  Constitutional:      General: She is not in acute distress.    Appearance: Normal appearance. She is obese. She is not ill-appearing, toxic-appearing or diaphoretic.  HENT:     Head: Normocephalic and atraumatic.     Right Ear: Tympanic membrane, ear canal and external ear normal. There is no impacted cerumen.     Left Ear: Tympanic membrane, ear canal and external ear normal. There is no impacted cerumen.     Nose: Nose normal. No congestion or rhinorrhea.     Mouth/Throat:     Mouth: Mucous membranes are moist.     Pharynx: Oropharynx is clear. No oropharyngeal exudate or posterior oropharyngeal erythema.  Eyes:     General: No scleral  icterus.       Right eye: No discharge.        Left eye: No discharge.     Extraocular Movements: Extraocular movements intact.     Conjunctiva/sclera: Conjunctivae normal.     Pupils: Pupils are equal, round, and reactive to light.  Neck:     Vascular: No carotid bruit.  Cardiovascular:     Rate and Rhythm: Normal rate and regular rhythm.     Pulses: Normal pulses.     Heart sounds: Normal heart sounds. No murmur heard.    No friction rub. No gallop.  Pulmonary:     Effort: Pulmonary effort is normal. No respiratory distress.     Breath sounds: Normal breath sounds. No stridor. No wheezing, rhonchi or rales.  Chest:     Chest wall: No tenderness.     Comments: Well healed scar in the upper inner quadrant of the right breast.  Both breasts are without masses.  Abdominal:     General: Bowel sounds are normal. There is no distension.     Palpations: Abdomen is soft. There is no hepatomegaly, splenomegaly or mass.     Tenderness: There is no abdominal tenderness. There  is no right CVA tenderness, left CVA tenderness, guarding or rebound.     Hernia: A hernia is present. Hernia is present in the umbilical area.  Musculoskeletal:        General: No swelling, tenderness, deformity or signs of injury. Normal range of motion.     Cervical back: Normal range of motion and neck supple. No rigidity or tenderness.     Right lower leg: No edema.     Left lower leg: No edema.  Lymphadenopathy:     Cervical: No cervical adenopathy.     Right cervical: No superficial, deep or posterior cervical adenopathy.    Left cervical: No superficial, deep or posterior cervical adenopathy.     Upper Body:     Right upper body: No supraclavicular, axillary or pectoral adenopathy.     Left upper body: No supraclavicular, axillary or pectoral adenopathy.  Skin:    General: Skin is warm and dry.     Coloration: Skin is not jaundiced or pale.     Findings: No bruising, erythema, lesion or rash.     Comments:  Raised 1cm nodule in the mid anterior sternum which is erythematous and has a slightly pearly look.   Neurological:     General: No focal deficit present.     Mental Status: She is alert and oriented to person, place, and time. Mental status is at baseline.     Cranial Nerves: No cranial nerve deficit.     Sensory: No sensory deficit.     Motor: No weakness.     Coordination: Coordination normal.     Gait: Gait normal.     Deep Tendon Reflexes: Reflexes normal.  Psychiatric:        Mood and Affect: Mood normal.        Behavior: Behavior normal.        Thought Content: Thought content normal.        Judgment: Judgment normal.    LABS:      Latest Ref Rng & Units 09/20/2021   12:00 AM 10/31/2019    3:40 AM 10/30/2019    2:40 AM  CBC  WBC  5.6  7.2  6.7   Hemoglobin 12.0 - 16.0 10.5  10.9  11.1   Hematocrit 36 - 46 31  34.5  34.6   Platelets 150 - 399 210  323  320       Latest Ref Rng & Units 09/20/2021   12:00 AM 05/08/2020    4:08 PM 04/12/2020    3:07 PM  CMP  Glucose 65 - 99 mg/dL  93  84   BUN 4 - 21 12  15  14    Creatinine 0.5 - 1.1 1.0  0.96  0.99   Sodium 137 - 147 138  141  137   Potassium 3.4 - 5.3 3.8  4.0  4.5   Chloride 99 - 108 98  98  95   CO2 13 - 22 36  30  28   Calcium 8.7 - 10.7 8.9  9.2  9.6   Alkaline Phos 25 - 125 64     AST 13 - 35 26     ALT 7 - 35 18       Lab Results  Component Value Date   FERRITIN 40 10/31/2019   FERRITIN 64 10/30/2019   FERRITIN 91 10/29/2019   Lab Results  Component Value Date   LDH 216 (H) 10/28/2019    STUDIES:  :EXAM:  08/17/23  Bilateral Screening Mammogram  No mammographic evidence of malignancy.   HISTORY:   Allergies: No Known Allergies  Current Medications: Current Outpatient Medications  Medication Sig Dispense Refill   allopurinol (ZYLOPRIM) 100 MG tablet Take 100 mg by mouth daily.     aspirin EC 81 MG tablet Take 81 mg by mouth daily.     Calcium Carb-Cholecalciferol 600-200 MG-UNIT TABS Take  1 tablet by mouth 2 (two) times daily.      carvedilol (COREG) 25 MG tablet Take 25 mg by mouth 2 (two) times daily with a meal.     cyanocobalamin (,VITAMIN B-12,) 1000 MCG/ML injection Inject 1,000 mcg into the skin every 30 (thirty) days.     dextromethorphan-guaiFENesin (MUCINEX DM) 30-600 MG 12hr tablet Take 1 tablet by mouth 2 (two) times daily.     ferrous sulfate 324 (65 Fe) MG TBEC Take 1 tablet by mouth daily.     fluticasone (FLONASE) 50 MCG/ACT nasal spray Place 1 spray into both nostrils in the morning and at bedtime.     levothyroxine (SYNTHROID, LEVOTHROID) 50 MCG tablet Take 50 mcg by mouth daily before breakfast.     lisinopril (PRINIVIL,ZESTRIL) 40 MG tablet Take 40 mg by mouth daily.     Omega-3 Fatty Acids (FISH OIL) 1200 MG CAPS Take 1 capsule by mouth daily.     raloxifene (EVISTA) 60 MG tablet TAKE 1 TABLET EVERY DAY 90 tablet 0   Tiotropium Bromide-Olodaterol (STIOLTO RESPIMAT) 2.5-2.5 MCG/ACT AERS Inhale 2 puffs into the lungs daily. 3 each 3   torsemide (DEMADEX) 100 MG tablet Take 100 mg by mouth in the morning.     torsemide (DEMADEX) 20 MG tablet Take 40 mg by mouth daily in the afternoon.     tretinoin (RETIN-A) 0.1 % cream Apply 1 Application topically at bedtime.     No current facility-administered medications for this visit.   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 She remains without evidence of recurrence. She will complete 5 years in November, 2025.   Osteopenia with worsening in the femur in August 2021, for which she continues calcium and vitamin D.  The raloxifene should help her bone density as well.  Unfortunately her bone density scan from November of this year shows mild worsening but still osteopenia.  I will ask Amy Moon to check vitamin D levels and give her opinion as to whether she needs a medication for her bones.  Plan: She informed me that she had fractured her  upper end of left humerus and did not want surgery so she is going through physical therapy. She states that she is healing slowly. Due to this we will postpone her mammogram until next year as she is not able to lift her left arm fully. She will have completed her 5 years of Raloxifene this November and I informed her after she finishes her current 3 month bottle she can stop this. She will be due for repeat bone density scan in November but will postpone that. I noticed a raised erythematous nodule on her mid sternum and I recommended she have this evaluated by dermatology. Her PCP does routine labs and she will meet with her on 06/10/2024. I will see her back in 1 year with bone density for our long term survivorship clinic. The patient understands the plans discussed today and is in agreement with them. She knows to contact our office if she  develops issues requiring immediate clinical assessment.  I provided 14 minutes of face-to-face time during this this encounter and > 50% was spent counseling as documented under my assessment and plan.   Wanda VEAR Cornish, MD  [facility] DE[facility] OF [facility] [address] [place] [address] Dept: [phone] Dept Fax: [phone]   No orders of the defined types were placed in this encounter.   I,Jasmine M Lassiter,acting as a scribe for Wanda VEAR Cornish, MD.,have documented all relevant documentation on the behalf of Wanda VEAR Cornish, MD,as directed by  Wanda VEAR Cornish, MD while in the presence of Wanda VEAR Cornish, MD.

## 2024-05-31 ENCOUNTER — Inpatient Hospital Stay: Attending: Oncology | Admitting: Oncology

## 2024-05-31 ENCOUNTER — Telehealth: Payer: Self-pay | Admitting: Oncology

## 2024-05-31 ENCOUNTER — Other Ambulatory Visit: Payer: Self-pay | Admitting: Oncology

## 2024-05-31 VITALS — BP 123/64 | HR 64 | Temp 97.9°F | Resp 18 | Ht 62.0 in | Wt 217.9 lb

## 2024-05-31 DIAGNOSIS — Z170 Estrogen receptor positive status [ER+]: Secondary | ICD-10-CM | POA: Insufficient documentation

## 2024-05-31 DIAGNOSIS — Z79810 Long term (current) use of selective estrogen receptor modulators (SERMs): Secondary | ICD-10-CM | POA: Diagnosis not present

## 2024-05-31 DIAGNOSIS — Z09 Encounter for follow-up examination after completed treatment for conditions other than malignant neoplasm: Secondary | ICD-10-CM

## 2024-05-31 DIAGNOSIS — Z86000 Personal history of in-situ neoplasm of breast: Secondary | ICD-10-CM

## 2024-05-31 DIAGNOSIS — D0511 Intraductal carcinoma in situ of right breast: Secondary | ICD-10-CM | POA: Insufficient documentation

## 2024-05-31 DIAGNOSIS — Z780 Asymptomatic menopausal state: Secondary | ICD-10-CM

## 2024-05-31 DIAGNOSIS — Z1721 Progesterone receptor positive status: Secondary | ICD-10-CM | POA: Insufficient documentation

## 2024-05-31 DIAGNOSIS — M8589 Other specified disorders of bone density and structure, multiple sites: Secondary | ICD-10-CM | POA: Diagnosis not present

## 2024-05-31 NOTE — Telephone Encounter (Signed)
 Patient has been scheduled for follow-up visit per 05/31/24 LOS.  Pt given an appt calendar with date and time.

## 2024-06-01 DIAGNOSIS — Z9181 History of falling: Secondary | ICD-10-CM | POA: Diagnosis not present

## 2024-06-01 DIAGNOSIS — Z0000 Encounter for general adult medical examination without abnormal findings: Secondary | ICD-10-CM | POA: Diagnosis not present

## 2024-06-04 ENCOUNTER — Encounter: Payer: Self-pay | Admitting: Oncology

## 2024-06-10 DIAGNOSIS — D539 Nutritional anemia, unspecified: Secondary | ICD-10-CM | POA: Diagnosis not present

## 2024-06-10 DIAGNOSIS — E538 Deficiency of other specified B group vitamins: Secondary | ICD-10-CM | POA: Diagnosis not present

## 2024-06-10 DIAGNOSIS — I5032 Chronic diastolic (congestive) heart failure: Secondary | ICD-10-CM | POA: Diagnosis not present

## 2024-06-10 DIAGNOSIS — I10 Essential (primary) hypertension: Secondary | ICD-10-CM | POA: Diagnosis not present

## 2024-06-10 DIAGNOSIS — E039 Hypothyroidism, unspecified: Secondary | ICD-10-CM | POA: Diagnosis not present

## 2024-06-10 DIAGNOSIS — J9611 Chronic respiratory failure with hypoxia: Secondary | ICD-10-CM | POA: Diagnosis not present

## 2024-06-10 DIAGNOSIS — Z9981 Dependence on supplemental oxygen: Secondary | ICD-10-CM | POA: Diagnosis not present

## 2024-06-10 DIAGNOSIS — R739 Hyperglycemia, unspecified: Secondary | ICD-10-CM | POA: Diagnosis not present

## 2024-06-10 DIAGNOSIS — E785 Hyperlipidemia, unspecified: Secondary | ICD-10-CM | POA: Diagnosis not present

## 2024-06-10 DIAGNOSIS — E559 Vitamin D deficiency, unspecified: Secondary | ICD-10-CM | POA: Diagnosis not present

## 2024-06-15 DIAGNOSIS — E538 Deficiency of other specified B group vitamins: Secondary | ICD-10-CM | POA: Diagnosis not present

## 2024-07-04 DIAGNOSIS — D225 Melanocytic nevi of trunk: Secondary | ICD-10-CM | POA: Diagnosis not present

## 2024-07-04 DIAGNOSIS — L821 Other seborrheic keratosis: Secondary | ICD-10-CM | POA: Diagnosis not present

## 2024-07-04 DIAGNOSIS — L578 Other skin changes due to chronic exposure to nonionizing radiation: Secondary | ICD-10-CM | POA: Diagnosis not present

## 2024-07-04 DIAGNOSIS — C44519 Basal cell carcinoma of skin of other part of trunk: Secondary | ICD-10-CM | POA: Diagnosis not present

## 2024-07-22 ENCOUNTER — Ambulatory Visit: Admitting: Pulmonary Disease

## 2024-08-03 DIAGNOSIS — E538 Deficiency of other specified B group vitamins: Secondary | ICD-10-CM | POA: Diagnosis not present

## 2024-08-16 ENCOUNTER — Ambulatory Visit (INDEPENDENT_AMBULATORY_CARE_PROVIDER_SITE_OTHER)
Admission: RE | Admit: 2024-08-16 | Discharge: 2024-08-16 | Disposition: A | Discharge status: Home / Self Care | Source: Ambulatory Visit | Attending: Oncology | Admitting: Oncology

## 2024-08-16 DIAGNOSIS — M8580 Other specified disorders of bone density and structure, unspecified site: Secondary | ICD-10-CM | POA: Diagnosis not present

## 2024-08-16 DIAGNOSIS — M810 Age-related osteoporosis without current pathological fracture: Secondary | ICD-10-CM | POA: Diagnosis not present

## 2024-08-22 DIAGNOSIS — D485 Neoplasm of uncertain behavior of skin: Secondary | ICD-10-CM | POA: Diagnosis not present

## 2024-08-23 ENCOUNTER — Telehealth: Payer: Self-pay

## 2024-08-23 NOTE — Telephone Encounter (Signed)
 Pt requesting results from bone density.

## 2024-08-24 ENCOUNTER — Telehealth: Payer: Self-pay

## 2024-08-24 NOTE — Telephone Encounter (Signed)
-----   Message from Andrez DELENA Foy sent at 08/24/2024  1:09 PM EST ----- Please fax bone density scans from this year and 2023 to [facility], thanks

## 2024-08-24 NOTE — Telephone Encounter (Signed)
 Reports faxed

## 2024-08-29 ENCOUNTER — Telehealth: Payer: Self-pay | Admitting: Hematology and Oncology

## 2024-08-29 NOTE — Telephone Encounter (Signed)
 08/29/24 PCP-Amy Moon will address abnormal Dexa scan at next office visit-Per Office

## 2024-09-07 DIAGNOSIS — E538 Deficiency of other specified B group vitamins: Secondary | ICD-10-CM | POA: Diagnosis not present

## 2024-10-11 ENCOUNTER — Other Ambulatory Visit: Payer: Self-pay | Admitting: Pulmonary Disease

## 2025-05-30 ENCOUNTER — Ambulatory Visit: Admitting: Oncology
